# Patient Record
Sex: Male | Born: 1940 | Race: White | Hispanic: No | Marital: Married | Smoking: Never smoker
Health system: Southern US, Community
[De-identification: ages and names within clinical notes are randomized; demographics above are authoritative.]

## PROBLEM LIST (undated history)

## (undated) DIAGNOSIS — R609 Edema, unspecified: Secondary | ICD-10-CM

## (undated) DIAGNOSIS — G500 Trigeminal neuralgia: Secondary | ICD-10-CM

## (undated) DIAGNOSIS — C801 Malignant (primary) neoplasm, unspecified: Secondary | ICD-10-CM

## (undated) DIAGNOSIS — L02212 Cutaneous abscess of back [any part, except buttock]: Secondary | ICD-10-CM

## (undated) DIAGNOSIS — I499 Cardiac arrhythmia, unspecified: Secondary | ICD-10-CM

## (undated) DIAGNOSIS — E119 Type 2 diabetes mellitus without complications: Secondary | ICD-10-CM

## (undated) DIAGNOSIS — G509 Disorder of trigeminal nerve, unspecified: Secondary | ICD-10-CM

## (undated) DIAGNOSIS — A499 Bacterial infection, unspecified: Secondary | ICD-10-CM

## (undated) DIAGNOSIS — I10 Essential (primary) hypertension: Secondary | ICD-10-CM

## (undated) DIAGNOSIS — K219 Gastro-esophageal reflux disease without esophagitis: Secondary | ICD-10-CM

## (undated) DIAGNOSIS — M1990 Unspecified osteoarthritis, unspecified site: Secondary | ICD-10-CM

## (undated) HISTORY — DX: Type 2 diabetes mellitus without complications: E11.9

## (undated) HISTORY — DX: Disorder of trigeminal nerve, unspecified: G50.9

## (undated) HISTORY — PX: OTHER SURGICAL HISTORY: SHX169

## (undated) HISTORY — DX: Cutaneous abscess of back (any part, except buttock): L02.212

## (undated) HISTORY — DX: Gastro-esophageal reflux disease without esophagitis: K21.9

## (undated) HISTORY — PX: TRIGEMINAL NERVE DECOMPRESSION: SHX2579

## (undated) HISTORY — DX: Bacterial infection, unspecified: A49.9

---

## 2000-09-16 ENCOUNTER — Ambulatory Visit (HOSPITAL_COMMUNITY): Admission: RE | Admit: 2000-09-16 | Discharge: 2000-09-16 | Payer: Self-pay | Admitting: Neurosurgery

## 2000-09-16 ENCOUNTER — Encounter: Payer: Self-pay | Admitting: Neurosurgery

## 2000-09-26 ENCOUNTER — Encounter: Payer: Self-pay | Admitting: Neurosurgery

## 2000-09-28 ENCOUNTER — Encounter: Payer: Self-pay | Admitting: Neurosurgery

## 2000-09-28 ENCOUNTER — Ambulatory Visit (HOSPITAL_COMMUNITY): Admission: RE | Admit: 2000-09-28 | Discharge: 2000-09-28 | Payer: Self-pay | Admitting: Neurosurgery

## 2000-10-06 ENCOUNTER — Inpatient Hospital Stay (HOSPITAL_COMMUNITY): Admission: RE | Admit: 2000-10-06 | Discharge: 2000-10-09 | Payer: Self-pay | Admitting: Neurosurgery

## 2000-10-08 ENCOUNTER — Encounter: Payer: Self-pay | Admitting: Neurosurgery

## 2002-10-06 ENCOUNTER — Ambulatory Visit (HOSPITAL_COMMUNITY): Admission: RE | Admit: 2002-10-06 | Discharge: 2002-10-06 | Payer: Self-pay | Admitting: Neurosurgery

## 2002-10-06 ENCOUNTER — Encounter: Payer: Self-pay | Admitting: Neurosurgery

## 2003-05-20 ENCOUNTER — Ambulatory Visit (HOSPITAL_COMMUNITY): Admission: RE | Admit: 2003-05-20 | Discharge: 2003-05-20 | Payer: Self-pay | Admitting: Neurosurgery

## 2004-05-24 ENCOUNTER — Ambulatory Visit: Payer: Self-pay | Admitting: Internal Medicine

## 2004-06-02 ENCOUNTER — Ambulatory Visit: Payer: Self-pay | Admitting: Internal Medicine

## 2008-02-29 HISTORY — PX: APPENDECTOMY: SHX54

## 2008-09-06 ENCOUNTER — Inpatient Hospital Stay: Payer: Self-pay | Admitting: Surgery

## 2010-06-16 ENCOUNTER — Ambulatory Visit: Payer: Self-pay

## 2010-07-30 ENCOUNTER — Ambulatory Visit: Payer: Self-pay | Admitting: Unknown Physician Specialty

## 2010-08-29 ENCOUNTER — Ambulatory Visit: Payer: Self-pay | Admitting: Unknown Physician Specialty

## 2013-02-28 DIAGNOSIS — A499 Bacterial infection, unspecified: Secondary | ICD-10-CM

## 2013-02-28 HISTORY — DX: Bacterial infection, unspecified: A49.9

## 2013-05-21 LAB — COMPREHENSIVE METABOLIC PANEL
ALK PHOS: 57 U/L
ALT: 22 U/L (ref 12–78)
AST: 20 U/L (ref 15–37)
Albumin: 3.5 g/dL (ref 3.4–5.0)
Anion Gap: 4 — ABNORMAL LOW (ref 7–16)
BUN: 18 mg/dL (ref 7–18)
Bilirubin,Total: 0.8 mg/dL (ref 0.2–1.0)
CALCIUM: 8.3 mg/dL — AB (ref 8.5–10.1)
CO2: 26 mmol/L (ref 21–32)
CREATININE: 1.15 mg/dL (ref 0.60–1.30)
Chloride: 105 mmol/L (ref 98–107)
EGFR (Non-African Amer.): >60
GLUCOSE: 216 mg/dL — AB (ref 65–99)
Osmolality: 279 (ref 275–301)
Potassium: 4.5 mmol/L (ref 3.5–5.1)
SODIUM: 135 mmol/L — AB (ref 136–145)
TOTAL PROTEIN: 6.7 g/dL (ref 6.4–8.2)

## 2013-05-21 LAB — CBC WITH DIFFERENTIAL/PLATELET
Basophil #: 0.2 10*3/uL — ABNORMAL HIGH (ref 0.0–0.1)
Basophil %: 1 %
Eosinophil #: 0 10*3/uL (ref 0.0–0.7)
Eosinophil %: 0.1 %
HCT: 37.6 % — AB (ref 40.0–52.0)
HGB: 12.4 g/dL — ABNORMAL LOW (ref 13.0–18.0)
LYMPHS ABS: 0.4 10*3/uL — AB (ref 1.0–3.6)
LYMPHS PCT: 2.4 %
MCH: 28.8 pg (ref 26.0–34.0)
MCHC: 33.1 g/dL (ref 32.0–36.0)
MCV: 87 fL (ref 80–100)
MONOS PCT: 4.2 %
Monocyte #: 0.8 x10 3/mm (ref 0.2–1.0)
NEUTROS ABS: 16.8 10*3/uL — AB (ref 1.4–6.5)
NEUTROS PCT: 92.3 %
PLATELETS: 353 10*3/uL (ref 150–440)
RBC: 4.31 10*6/uL — AB (ref 4.40–5.90)
RDW: 13.1 % (ref 11.5–14.5)
WBC: 18.2 10*3/uL — ABNORMAL HIGH (ref 3.8–10.6)

## 2013-05-21 LAB — URINALYSIS, COMPLETE
BACTERIA: NONE SEEN
BILIRUBIN, UR: NEGATIVE
Blood: NEGATIVE
Glucose,UR: 50 mg/dL (ref 0–75)
Leukocyte Esterase: NEGATIVE
NITRITE: NEGATIVE
Ph: 5 (ref 4.5–8.0)
Protein: 30
RBC,UR: <1 /HPF (ref 0–5)
Specific Gravity: 1.031 (ref 1.003–1.030)
WBC UR: 2 /HPF (ref 0–5)

## 2013-05-21 LAB — LIPASE, BLOOD: Lipase: 147 U/L (ref 73–393)

## 2013-05-22 ENCOUNTER — Inpatient Hospital Stay: Payer: Self-pay | Admitting: Student

## 2013-05-23 LAB — BASIC METABOLIC PANEL
Anion Gap: 5 — ABNORMAL LOW (ref 7–16)
BUN: 22 mg/dL — ABNORMAL HIGH (ref 7–18)
CREATININE: 1.06 mg/dL (ref 0.60–1.30)
Calcium, Total: 7.9 mg/dL — ABNORMAL LOW (ref 8.5–10.1)
Chloride: 107 mmol/L (ref 98–107)
Co2: 24 mmol/L (ref 21–32)
Glucose: 168 mg/dL — ABNORMAL HIGH (ref 65–99)
Osmolality: 279 (ref 275–301)
POTASSIUM: 3.5 mmol/L (ref 3.5–5.1)
SODIUM: 136 mmol/L (ref 136–145)

## 2013-05-23 LAB — CBC WITH DIFFERENTIAL/PLATELET
BASOS PCT: 0.2 %
Basophil #: 0 10*3/uL (ref 0.0–0.1)
EOS PCT: 0.1 %
Eosinophil #: 0 10*3/uL (ref 0.0–0.7)
HCT: 35.1 % — ABNORMAL LOW (ref 40.0–52.0)
HGB: 11.8 g/dL — ABNORMAL LOW (ref 13.0–18.0)
Lymphocyte #: 0.6 10*3/uL — ABNORMAL LOW (ref 1.0–3.6)
Lymphocyte %: 5.5 %
MCH: 29.2 pg (ref 26.0–34.0)
MCHC: 33.6 g/dL (ref 32.0–36.0)
MCV: 87 fL (ref 80–100)
MONOS PCT: 9.4 %
Monocyte #: 1 x10 3/mm (ref 0.2–1.0)
Neutrophil #: 8.9 10*3/uL — ABNORMAL HIGH (ref 1.4–6.5)
Neutrophil %: 84.8 %
PLATELETS: 252 10*3/uL (ref 150–440)
RBC: 4.04 10*6/uL — ABNORMAL LOW (ref 4.40–5.90)
RDW: 13.7 % (ref 11.5–14.5)
WBC: 10.5 10*3/uL (ref 3.8–10.6)

## 2013-05-24 LAB — CLOSTRIDIUM DIFFICILE(ARMC)

## 2013-05-26 LAB — STOOL CULTURE

## 2013-05-27 LAB — CBC WITH DIFFERENTIAL/PLATELET
BASOS ABS: 0 10*3/uL (ref 0.0–0.1)
Basophil %: 0.4 %
EOS ABS: 0.2 10*3/uL (ref 0.0–0.7)
EOS PCT: 3.4 %
HCT: 36 % — AB (ref 40.0–52.0)
HGB: 12.2 g/dL — AB (ref 13.0–18.0)
Lymphocyte #: 0.9 10*3/uL — ABNORMAL LOW (ref 1.0–3.6)
Lymphocyte %: 13.1 %
MCH: 29.1 pg (ref 26.0–34.0)
MCHC: 33.9 g/dL (ref 32.0–36.0)
MCV: 86 fL (ref 80–100)
Monocyte #: 0.7 x10 3/mm (ref 0.2–1.0)
Monocyte %: 10.7 %
NEUTROS PCT: 72.4 %
Neutrophil #: 4.9 10*3/uL (ref 1.4–6.5)
Platelet: 355 10*3/uL (ref 150–440)
RBC: 4.19 10*6/uL — ABNORMAL LOW (ref 4.40–5.90)
RDW: 13.1 % (ref 11.5–14.5)
WBC: 6.8 10*3/uL (ref 3.8–10.6)

## 2013-05-27 LAB — BASIC METABOLIC PANEL
Anion Gap: 7 (ref 7–16)
BUN: 23 mg/dL — ABNORMAL HIGH (ref 7–18)
CALCIUM: 8.1 mg/dL — AB (ref 8.5–10.1)
Chloride: 112 mmol/L — ABNORMAL HIGH (ref 98–107)
Co2: 23 mmol/L (ref 21–32)
Creatinine: 0.72 mg/dL (ref 0.60–1.30)
EGFR (African American): >60
Glucose: 186 mg/dL — ABNORMAL HIGH (ref 65–99)
Osmolality: 292 (ref 275–301)
POTASSIUM: 3.9 mmol/L (ref 3.5–5.1)
Sodium: 142 mmol/L (ref 136–145)

## 2013-05-27 LAB — CULTURE, BLOOD (SINGLE)

## 2013-07-29 ENCOUNTER — Ambulatory Visit: Payer: Self-pay | Admitting: Gastroenterology

## 2013-07-31 LAB — PATHOLOGY REPORT

## 2013-08-27 DIAGNOSIS — I10 Essential (primary) hypertension: Secondary | ICD-10-CM | POA: Insufficient documentation

## 2013-08-27 DIAGNOSIS — E119 Type 2 diabetes mellitus without complications: Secondary | ICD-10-CM | POA: Insufficient documentation

## 2013-08-27 DIAGNOSIS — E785 Hyperlipidemia, unspecified: Secondary | ICD-10-CM | POA: Insufficient documentation

## 2013-12-08 DIAGNOSIS — E119 Type 2 diabetes mellitus without complications: Secondary | ICD-10-CM | POA: Insufficient documentation

## 2013-12-08 DIAGNOSIS — Z794 Long term (current) use of insulin: Secondary | ICD-10-CM | POA: Insufficient documentation

## 2014-05-12 ENCOUNTER — Ambulatory Visit (INDEPENDENT_AMBULATORY_CARE_PROVIDER_SITE_OTHER): Payer: Medicare Other | Admitting: Podiatry

## 2014-05-12 ENCOUNTER — Encounter: Payer: Self-pay | Admitting: Podiatry

## 2014-05-12 ENCOUNTER — Ambulatory Visit: Payer: Medicare Other

## 2014-05-12 VITALS — BP 138/78 | HR 58 | Resp 16 | Ht 66.0 in | Wt 185.0 lb

## 2014-05-12 DIAGNOSIS — M7750 Other enthesopathy of unspecified foot: Secondary | ICD-10-CM

## 2014-05-12 DIAGNOSIS — M19079 Primary osteoarthritis, unspecified ankle and foot: Secondary | ICD-10-CM | POA: Diagnosis not present

## 2014-05-12 DIAGNOSIS — M779 Enthesopathy, unspecified: Secondary | ICD-10-CM

## 2014-05-12 NOTE — Progress Notes (Signed)
   Subjective:    Patient ID: Larry Lane, male    DOB: 02-16-1941, 74 y.o.   MRN: [id number]  HPI Comments: "I have problems with the ankles"  Patient c/o aching medial ankles bilateral, right over left, for several months. Worsened in the last 6 months. The area is swollen. No injury. He feels more pain with a lot of walking or climbing ladders. He did see PCP and he sent for xray and they told him to bring to this appointment. No treatments. He has tried heat and ice at home.  Patient brought xrays and a report today of both ankles  Diabetic x 20 years and last A1C was 8.0     Review of Systems  Genitourinary: Positive for frequency.  Musculoskeletal: Positive for arthralgias and gait problem.  Neurological: Positive for dizziness and light-headedness.  Hematological: Bruises/bleeds easily.  Psychiatric/Behavioral: Positive for confusion.  All other systems reviewed and are negative.      Objective:   Physical Exam: I have reviewed his past medical history medications allergies surgery social history and review of systems. Pulses are strongly palpable bilateral. Neurologic sensorium is intact. Deep tendon reflexes are intact bilateral muscle strength is 5 over 5 dorsiflexion plantar flexors and inverters everters all intrinsic musculature is intact. Orthopedic evaluation demonstrates all joints distal to the ankle for range of motion without crepitation. The ankle joint does not demonstrate crepitation but is limited on his range of motion. Radiographic evaluation which he brought with him today demonstrate joint space narrowing and subchondral sclerosis indicative of osteoarthritis. Mild hallux valgus deformity and hammertoe deformities are also noted bilateral.        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  Plan: Cortisone injection after sterile Betadine skin prep of the ankles bilaterally today. We'll follow up  with me in 6 weeks if necessary.

## 2014-05-12 NOTE — Patient Instructions (Signed)
Diabetes and Foot Care Diabetes may cause you to have problems because of poor blood supply (circulation) to your feet and legs. This may cause the skin on your feet to become thinner, break easier, and heal more slowly. Your skin may become dry, and the skin may peel and crack. You may also have nerve damage in your legs and feet causing decreased feeling in them. You may not notice minor injuries to your feet that could lead to infections or more serious problems. Taking care of your feet is one of the most important things you can do for yourself.  HOME CARE INSTRUCTIONS  Wear shoes at all times, even in the house. Do not go barefoot. Bare feet are easily injured.  Check your feet daily for blisters, cuts, and redness. If you cannot see the bottom of your feet, use a mirror or ask someone for help.  Wash your feet with warm water (do not use hot water) and mild soap. Then pat your feet and the areas between your toes until they are completely dry. Do not soak your feet as this can dry your skin.  Apply a moisturizing lotion or petroleum jelly (that does not contain alcohol and is unscented) to the skin on your feet and to dry, brittle toenails. Do not apply lotion between your toes.  Trim your toenails straight across. Do not dig under them or around the cuticle. File the edges of your nails with an emery board or nail file.  Do not cut corns or calluses or try to remove them with medicine.  Wear clean socks or stockings every day. Make sure they are not too tight. Do not wear knee-high stockings since they may decrease blood flow to your legs.  Wear shoes that fit properly and have enough cushioning. To break in new shoes, wear them for just a few hours a day. This prevents you from injuring your feet. Always look in your shoes before you put them on to be sure there are no objects inside.  Do not cross your legs. This may decrease the blood flow to your feet.  If you find a minor scrape,  cut, or break in the skin on your feet, keep it and the skin around it clean and dry. These areas may be cleansed with mild soap and water. Do not cleanse the area with peroxide, alcohol, or iodine.  When you remove an adhesive bandage, be sure not to damage the skin around it.  If you have a wound, look at it several times a day to make sure it is healing.  Do not use heating pads or hot water bottles. They may burn your skin. If you have lost feeling in your feet or legs, you may not know it is happening until it is too late.  Make sure your health care provider performs a complete foot exam at least annually or more often if you have foot problems. Report any cuts, sores, or bruises to your health care provider immediately. SEEK MEDICAL CARE IF:   You have an injury that is not healing.  You have cuts or breaks in the skin.  You have an ingrown nail.  You notice redness on your legs or feet.  You feel burning or tingling in your legs or feet.  You have pain or cramps in your legs and feet.  Your legs or feet are numb.  Your feet always feel cold. SEEK IMMEDIATE MEDICAL CARE IF:   There is increasing redness,   swelling, or pain in or around a wound.  There is a red line that goes up your leg.  Pus is coming from a wound.  You develop a fever or as directed by your health care provider.  You notice a bad smell coming from an ulcer or wound. Document Released: 02/12/2000 Document Revised: 10/17/2012 Document Reviewed: 07/24/2012 Exit[employer] Patient Information 2015 [employer]. This information is not intended to replace advice given to you by your health care provider. Make sure you discuss any questions you have with your health care provider.  

## 2014-05-12 NOTE — Addendum Note (Signed)
Addended by: Rip Harbour on: 05/12/2014 04:24 PM   Modules accepted: Orders

## 2014-06-21 NOTE — Consult Note (Signed)
PATIENT NAME:  Larry Lane, Larry Lane MR#:  [id number] DATE OF BIRTH:  Feb 12, 1941  DATE OF CONSULTATION:  05/22/2013  PRIMARY CARE PHYSICIAN:  Dr. Morton Peters., MD ATTENDING PHYSICIAN:  Dr. Carolan Clines PHYSICIAN:  Dr. Joen Laura, NP  HISTORY OF PRESENT ILLNESS: Larry Lane is a very pleasant 74 year old Caucasian gentleman with a history of diabetes mellitus and trigeminal neuralgia. He had presented to [facility] Emergency Room for the chief complaint of severe right lower quadrant abdominal pain. He has been experiencing abdominal discomfort intermittently for the past year. 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 Really since this timeframe, per him, he has actually been intermittently experiencing discomfort, but again has been more frequent and increased in severity over the past year. Upon presenting to the hospital, his pain was a 10/10 and no radiating pain. Normally he does experience loose diarrhea-like stools. He also has been taking Pepto-Bismol on a very regular basis to try to manage the pain to the right lower quadrant, which has really not seemed to help. Nauseated at this time, he did have episodes of vomiting yesterday. No bright red blood. No melena. Wife present during interviewing process also states that he experiences dysphagia at times. Solid food bolus getting hung average twice a week, which has caused him to regurgitate at times.   The patient had been out in the weather this past week with raining cooking barbecue but does not feel that that this is reason for his current symptoms, as he has also been febrile during his hospitalization with temperature to 103. Has not eaten any oysters. Has had no recent antibiotic use or traveled out of the country. He is a Psychologist, sport and exercise, farming wheat, soy and tobacco.    The patient has had colonoscopy performed in the past, was done in 2006 by Dr. Denyse Amass.  Findings of internal hemorrhoids. Colon was normal. Biopsies were obtained. 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 Biopsies at the time of the upper endoscopy antrum revealed mild chronic nonspecific gastritis. Evidence of a reflux esophagitis noted at the time of biopsies taken from the esophagus. Reading the report though, does not appear that duodenal stenosis was dilated at that time.   PAST MEDICAL HISTORY: Diabetes mellitus, trigeminal neuralgia.  PAST SURGICAL HISTORY: Multiple surgeries for trigeminal neuralgia and appendectomy. Colonoscopy and upper endoscopy 2006. ALLERGIES: None.   HOME MEDICATIONS: Ranitidine 150 mg once a day, probiotic 1 capsule daily, Lantus 22 units once a day, sliding scale insulin, fish oil 1000 mg 2 a day, dicyclomine 10 mg 3 times daily, Questran 3 times daily, atenolol 50 mg once a day, aspirin 325 two tablets once a day and Percocet 1 tablet every 6 hours as needed.   SOCIAL HISTORY: No tobacco. No alcohol use.   FAMILY HISTORY:  No history of colon cancer or other neoplasm.  History of diabetes.  Family history of believed to be IBS involving brother and a niece, possibly IBD.  REVIEW OF SYSTEMS:  All 10 systems reviewed and checked, otherwise unremarkable unless stated above.   PHYSICAL EXAMINATION: VITAL SIGNS: Temperature is 99.2, pulse of 91, respirations 20, blood pressure 135/64 with a pulse of 92% on room air.  GENERAL: Well-developed, well-nourished 74 year old Caucasian gentleman who appears not to be feeling well.  HEENT: Normocephalic,  atraumatic. Pupils equal, reactive to light. Conjunctivae clear. Sclerae anicteric.  NECK: Supple. Trachea midline. No lymphadenopathy or thyromegaly.  PULMONARY: Symmetric rise and fall of chest. Clear to auscultation throughout.  CARDIOVASCULAR:  Regular rhythm, S1, S2. No murmurs or gallops.  ABDOMEN: High-pitched bowel sounds noted left and right lower quadrant. Bowel sounds in bilateral upper quadrants, slightly distended abdomen.  No bruits. No masses. No evidence of hepatosplenomegaly.  RECTAL: Deferred.  MUSCULOSKELETAL: Moving all 4 extremities. No contractures. No clubbing.  EXTREMITIES: No edema.  SKIN: Warm, dry. Color slightly pale. No lesions. No rashes.  NEUROLOGICAL: No gross neurological deficits.  PSYCHIATRIC: No depression. Alert and oriented x 4, appropriate affect and mood.   LABORATORY, DIAGNOSTIC AND RADIOLOGICAL DATA: Chemistry panel on admission: Glucose elevated 216. Sodium was 135. Calcium is low at 8.3. Lipase is 147. Hepatic panel within normal limits. CBC: WBC count was elevated at 18.2 with RBC of 4.31, hemoglobin 12.4 with hematocrit of 37.6. Neutrophil number elevated at 16.8 with a lymphocyte low at 0.4. Basophil elevated at 0.2.  Urinalysis: Ketones are +1. Protein is 30 mg/dL. Negative for nitrites and leukocytes.  CT scan of abdomen and pelvis with contrast on admission: Liver, spleen, gallbladder, pancreas and adrenal glands are unremarkable. Inflammatory changes of the terminal ileum with fecalized small bowel. Distal ileum bowel loop measuring 4.7 cm in transaxial dimension. Extensive cecal and ascending colon wall edema with surrounding inflammatory changes status post appendectomy. Trace of free fluid in the right pericolic gutter without abscess or pneumoperitoneum. Stomach: Proximal small bowel are normal in caliber and coarse without inflammatory changes. Kidneys are, I think it is supposed to be atherotopic; but it says "orthotopic" demonstrating symmetric enhancement. Degenerative changes noted to hips as well as mild degenerative changes lumbar spine.   IMPRESSION: Right lower quadrant abdominal pain has been intermittent but a constant presentation in the last 24 to 48 hours with associated  vomiting. Abnormal GI x-ray, CT scan of abdomen and pelvis revealing terminal ileum and cecal/ascending colonic wall edema and inflammatory changes consistent with enterocolitis, possibly infectious or less likely inflammatory in nature. Concern though does exist by the fact of symptoms being very similar in presentation when he had his appendectomy. Also, experiencing dysphagia. 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 Leukocytosis. Fever.   PLAN: The patient's presentation will be discussed with Dr. Verdie Shire. The patient should continue with meropenem 1 gram every 8 hours. Pending chest, PA and lateral.  These services provided by Payton Emerald, MS, APRN, [facility], FNP under collaborative agreement with Verdie Shire, M.D.   ____________________________ Payton Emerald, NP dsh:ce D: 05/22/2013 14:00:44 ET T: 05/22/2013 15:36:48 ET JOB#: [id number]  cc: Payton Emerald, NP, <Dictator> Payton Emerald MD ELECTRONICALLY SIGNED 05/22/2013 17:40

## 2014-06-21 NOTE — Consult Note (Signed)
Chief Complaint:  Subjective/Chief Complaint seen for abnormal ct/ileitis.  positive campylobacter stool. much improved, no abdominal pain or nausea.   VITAL SIGNS/ANCILLARY NOTES: **Vital Signs.:   31-Mar-15 05:04  Vital Signs Type Routine  Temperature Temperature (F) 98.1  Celsius 36.7  Temperature Source oral  Pulse Pulse 64  Respirations Respirations 18  Systolic BP Systolic BP 951  Diastolic BP (mmHg) Diastolic BP (mmHg) 74  Mean BP 110  Pulse Ox % Pulse Ox % 99  Pulse Ox Activity Level  At rest  Oxygen Delivery Room Air/ 21 %    07:11  Vital Signs Type Recheck  Systolic BP Systolic BP 884  Diastolic BP (mmHg) Diastolic BP (mmHg) 79  Mean BP 109   Brief Assessment:  Cardiac Regular   Respiratory clear BS   Gastrointestinal details normal Soft  Nontender  Nondistended  No masses palpable   Lab Results:  Routine Chem:  30-Mar-15 03:36   Glucose, Serum  186  BUN  23  Creatinine (comp) 0.72  Sodium, Serum 142  Potassium, Serum 3.9  Chloride, Serum  112  CO2, Serum 23  Calcium (Total), Serum  8.1  Anion Gap 7  Osmolality (calc) 292  eGFR (African American) >60  eGFR (Non-African American) >60 (eGFR values <64m/min/1.73 m2 may be an indication of chronic kidney disease (CKD). Calculated eGFR is useful in patients with stable renal function. The eGFR calculation will not be reliable in acutely ill patients when serum creatinine is changing rapidly. It is not useful in  patients on dialysis. The eGFR calculation may not be applicable to patients at the low and high extremes of body sizes, pregnant women, and vegetarians.)  Routine Hem:  30-Mar-15 03:36   WBC (CBC) 6.8  RBC (CBC)  4.19  Hemoglobin (CBC)  12.2  Hematocrit (CBC)  36.0  Platelet Count (CBC) 355  MCV 86  MCH 29.1  MCHC 33.9  RDW 13.1  Neutrophil % 72.4  Lymphocyte % 13.1  Monocyte % 10.7  Eosinophil % 3.4  Basophil % 0.4  Neutrophil # 4.9  Lymphocyte #  0.9  Monocyte # 0.7   Eosinophil # 0.2  Basophil # 0.0 (Result(s) reported on 27 May 2013 at 04:11AM.)   Assessment/Plan:  Assessment/Plan:  Assessment 1) abdominal pain, n,v partial sbo-resolved.  abnormal ct with ileitis noted, but positive stool for campylobacter.  has responded to treatment well.   Plan 1) recommend finish 10 day course of abx, advance diet as tolerated, no luminal evaluation planned as inpatient, will need GI o/p followup and colonoscopy in several weeks.  Will sign off, reconsult as needed. Discussed with Dr ABridgette Habermann   Electronic Signatures: SLoistine Simas(MD)  (Signed 31-Mar-15 13:04)  Authored: Chief Complaint, VITAL SIGNS/ANCILLARY NOTES, Brief Assessment, Lab Results, Assessment/Plan   Last Updated: 31-Mar-15 13:04 by SLoistine Simas(MD)

## 2014-06-21 NOTE — Consult Note (Signed)
Brief Consult Note: Diagnosis: Right lower quadrant abdominal pain.  Abnormal GI x-ray findings of concern for enterocolitis.  Fever.  Dysphagia.   Consult note dictated.   Orders entered.   Discussed with Attending MD.   Comments: Patient's presentation discussed with Dr. Verdie Shire.  Will proceed with diagnostic colonoscopy on Friday of this week along with EGD with dilatation.  History of duodenal stenosis.  Experiencing dysphagia.  Will continue to monitor.  Remain on current antibiotic therapy.  Pending chest x-ray PA and LAT.  Orders placed.  Electronic Signatures: Payton Emerald (NP)  (Signed 25-Mar-15 14:35)  Authored: Brief Consult Note   Last Updated: 25-Mar-15 14:35 by Payton Emerald (NP)

## 2014-06-21 NOTE — Consult Note (Signed)
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   Patient was seen by consultant.   Consult note dictated.   Recommend further assessment or treatment.   Discussed with Attending MD.   Comments: s/p lap appy 08/2008. Had a 12 mm appendix on CT then; none now. Has circumferential significant TI thickening with associated inflammation to ascending colon. Has a 4 year h/o chronic diarrhea too. No indications for acute surgical intervention. Will follow.  Electronic Signatures: Consuela Mimes (MD)  (Signed 25-Mar-15 00:55)  Authored: Brief Consult Note   Last Updated: 25-Mar-15 00:55 by Consuela Mimes (MD)

## 2014-06-21 NOTE — H&P (Signed)
PATIENT NAME:  Larry Lane, Larry Lane MR#:  [id number] DATE OF BIRTH:  August 21, 1940  DATE OF ADMISSION:  05/22/2013  PRIMARY CARE PHYSICIAN: Morton Peters., MD  REFERRING PHYSICIAN: Dr. Owens Shark  CHIEF COMPLAINT: Abdominal pain, nausea, vomiting.   HISTORY OF PRESENT ILLNESS: Larry Lane, a 74 year old pleasant male, with past medical history of diabetes mellitus, insulin-dependent, trigeminal neuralgia has been having issues with his stomach for a long time. Experiences abdominal pain in the right lower quadrant fort more than a year. Patient was also noticed to have information in the ascending colon area. Per patient, underwent colonoscopies; however, the patient is unable to give any information. The patient also does not remember where his colonoscopies were done. The patient was admitted in 2010 and was found to have inflamed appendix. Underwent laparoscopic appendectomy. 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  The patient has been experiencing abdominal pain on and off as mentioned above. However, since yesterday started to have pain in the right lower quadrant, 10/10 intensity, no radiation. Had his lunch.  Since then having multiple episodes of vomiting. Also had 2 episodes of diarrhea. The patient always has some diarrhea. CT abdomen and pelvis done in the Emergency Department showed  terminal ileum and  cecal ascending colonic wall edema and inflammatory changes consistent with enterocolitis, dilated distal ileum, and small bowel creases suggesting chronic stasis without frank bowel obstruction. Concerning about the inflammation, consulted surgery who felt this is not an acute surgical problem.  PAST MEDICAL HISTORY:  1.  Diabetes mellitus, insulin-dependent.  2.  Trigeminal neuralgia. Underwent gamma knife with significant improvement with the pain.   PAST SURGICAL HISTORY: Multiple surgeries for trigeminal neuralgia and appendectomy.    ALLERGIES: No known drug allergies.   HOME MEDICATIONS:  1.  Ranitidine 150 mg once a day. 2.  Probiotic 1 capsule once a day.  3.  Lantus 22 units once a day.  4.  Sliding scale insulin.  5.  Fish oil 1000 mg 2 times a day. 6.  Dicyclomine 10 mg 3 times a day. 7.  Cholestyramine 3 times a day. 5 8.  Atenolol 50 mg once a day.  9.  Aspirin 325 mg 2 times a day.  10.  Percocet 1 tablet every 6 hours as needed.   SOCIAL HISTORY: No history of smoking, drinking alcohol, or using illicit drugs. Married, Lives with his wife.  Works as a Psychologist, sport and exercise.   FAMILY HISTORY:  History of diabetes mellitus.   REVIEW OF SYSTEMS:  CONSTITUTIONAL: Generalized weakness.  EYES: No change in vision.  ENT: No change in hearing or sore throat.  RESPIRATORY: No cough, shortness of breath.  CARDIOVASCULAR: No chest pains or palpitations.  GASTROINTESTINAL: Has nausea, vomiting, diarrhea, and abdominal pain. GENITOURINARY: No dysuria or hematuria.  ENDOCRINE: No polyuria or polydipsia. Has diagnosis of diabetes mellitus. SKIN: No rash or lesions.  MUSCULOSKELETAL: Denies any joint pains and aches.  NEUROLOGIC:  No weakness or numbness in any part of the body.   PHYSICAL EXAMINATION:  GENERAL: Reveals a well-built, well-nourished, age-appropriate male, lying down in the bed, not in acute distress.  VITAL SIGNS: Temperature 98.8, pulse 85, blood pressure 119/57, respiratory rate of 18, oxygen saturation is 95% on room air.  HEENT: Head: Normocephalic, atraumatic. There is no scleral icterus. Conjunctivae normal. Pupils equal and react to light. Extraocular movements are intact. Mucous membranes moist. No pharyngeal erythema.  NECK: Supple. No lymphadenopathy. No JVD. No carotid bruit.  No thyromegaly.  CHEST: Has no focal tenderness.  LUNGS: Bilaterally clear to auscultation.   HEART: S1, S2 regular. No murmurs are heard.  ABDOMEN: Bowel sounds present. Has tenderness in the right lower quadrant with mild  guarding. No rebound tenderness. Could not appreciate hepatosplenomegaly.  EXTREMITIES: No pedal edema. Pulses 2+.  NEUROLOGIC: The patient is alert, oriented to place, person, and time. Cranial nerves II through XII intact. Motor 5/5 in upper and lower extremities.  SKIN: No rash or lesions.  MUSCULOSKELETAL: Good range of motion in all the extremities.   LABORATORY DATA: CMP is completely within normal limits. CBC: WBC of 18.2, hemoglobin 12.4, platelet count of 353,000. Urinalysis negative for nitrites and leukocyte esterase.   CT abdomen and pelvis: Terminal ileum and cecal ascending colonic wall edema and inflammatory changes most consistent with enterocolitis; could be infectious. No bowel perforation or abscess. Is status post appendectomy. Dilated distal ileum with a small bowel features suggesting chronic stasis without frank bowel obstruction.   ASSESSMENT AND PLAN: Larry Lane is a 74 year old male who comes to the Emergency Department with nausea, vomiting, and is found to have terminal ileum and ascending colonic wall edema.  1.  Colitis, inflammatory versus infectious. Patient seems to have had this problem for a long time. Will need to obtain medical records from the primary care physician's office. Keep the patient on meropenem. Consulted gastroenterology in the morning. Keep the patient n.p.o. Continue with IV fluids.  2.  Diabetes mellitus. As patient is n.p.o., will keep the patient on Lantus 11 units.  3.  Nausea, vomiting, most likely secondary to narrowing of the ileum with bowel obstruction, improved.  4.  Chronic diarrhea as mentioned above. Need to work up the cause of the inflammation in the distal ileum and ascending colon area.   5.  Keep the patient on deep vein thrombosis prophylaxis with Lovenox.  TIME SPENT: Fifty minutes.   ____________________________ Monica Becton, MD pv:am D: 05/22/2013 01:38:10 ET T: 05/22/2013 02:22:50 ET JOB#: [id number]  cc: Monica Becton, MD, <Dictator> Morton Peters., MD Monica Becton MD ELECTRONICALLY SIGNED 06/13/2013 0:22

## 2014-06-21 NOTE — Consult Note (Signed)
Chief Complaint:  Subjective/Chief Complaint Nausea. No abd pain. WBC normal now.   VITAL SIGNS/ANCILLARY NOTES: **Vital Signs.:   26-Mar-15 04:31  Vital Signs Type Routine  Temperature Temperature (F) 98  Celsius 36.6  Temperature Source oral  Pulse Pulse 86  Respirations Respirations 20  Systolic BP Systolic BP 665  Diastolic BP (mmHg) Diastolic BP (mmHg) 71  Mean BP 88  Pulse Ox % Pulse Ox % 93  Pulse Ox Activity Level  At rest  Oxygen Delivery Room Air/ 21 %   Brief Assessment:  GEN no acute distress   Cardiac Regular   Respiratory clear BS   Gastrointestinal decreased bowel sounds   Lab Results: Routine Chem:  26-Mar-15 05:56   Glucose, Serum  168  BUN  22  Creatinine (comp) 1.06  Sodium, Serum 136  Potassium, Serum 3.5  Chloride, Serum 107  CO2, Serum 24  Calcium (Total), Serum  7.9  Anion Gap  5  Osmolality (calc) 279  eGFR (African American) >60  eGFR (Non-African American) >60 (eGFR values <71m/min/1.73 m2 may be an indication of chronic kidney disease (CKD). Calculated eGFR is useful in patients with stable renal function. The eGFR calculation will not be reliable in acutely ill patients when serum creatinine is changing rapidly. It is not useful in  patients on dialysis. The eGFR calculation may not be applicable to patients at the low and high extremes of body sizes, pregnant women, and vegetarians.)  Routine Hem:  26-Mar-15 05:56   WBC (CBC) 10.5  RBC (CBC)  4.04  Hemoglobin (CBC)  11.8  Hematocrit (CBC)  35.1  Platelet Count (CBC) 252  MCV 87  MCH 29.2  MCHC 33.6  RDW 13.7  Neutrophil % 84.8  Lymphocyte % 5.5  Monocyte % 9.4  Eosinophil % 0.1  Basophil % 0.2  Neutrophil #  8.9  Lymphocyte #  0.6  Monocyte # 1.0  Eosinophil # 0.0  Basophil # 0.0 (Result(s) reported on 23 May 2013 at 06:28AM.)   Assessment/Plan:  Assessment/Plan:  Assessment Dysphagia. Colitis.   Plan Continue Abx. Will see if patient can tolerate bowel prep  today for colonoscopy plus EGD with dilation tomorrow. Thanks.   Electronic Signatures: OVerdie Shire(MD)  (Signed 26-Mar-15 08:38)  Authored: Chief Complaint, VITAL SIGNS/ANCILLARY NOTES, Brief Assessment, Lab Results, Assessment/Plan   Last Updated: 26-Mar-15 08:38 by OVerdie Shire(MD)

## 2014-06-21 NOTE — Consult Note (Signed)
Chief Complaint:  Subjective/Chief Complaint patient admited with n/v abdominal pain.  abnormal ct with ileitis.  feeling a little better today, passing some flatus, small watery stool x1.  denies abdominal pain.   VITAL SIGNS/ANCILLARY NOTES: **Vital Signs.:   28-Mar-15 13:32  Vital Signs Type Routine  Celsius 36.6  Temperature Source oral  Pulse Pulse 84  Respirations Respirations 19  Systolic BP Systolic BP 419  Diastolic BP (mmHg) Diastolic BP (mmHg) 84  Mean BP 109  Pulse Ox % Pulse Ox % 96  Pulse Ox Activity Level  At rest  Oxygen Delivery Room Air/ 21 %  *Intake and Output.:   28-Mar-15 06:12  NG Suction ml     Out:  600    Shift 07:00  NG Suction ml     Out:  600    Daily 07:00  NG Suction ml     Out:  1300    15:02  NG Suction ml     Out:  200    Shift 23:00  NG Suction ml     Out:  200   Brief Assessment:  Cardiac Regular   Respiratory clear BS   Gastrointestinal details normal Soft  No rebound tenderness  minimal lower abdominal tenderness.  bowel sounds positive. mild distension   Lab Results: Routine Micro:  27-Mar-15 16:10   Micro Text Report STOOL COMPREHENSIVE   COMMENT                   NO SALMONELLA OR SHIGELLA ISOLATED   COMMENT                   NO PATHOGENIC E.COLI DETECTED   COMMENT                   POSITIVE CAMPYLOBACTER AG (JEJUNI/COLI)   ANTIBIOTIC                        Micro Text Report CLOSTRIDIUM DIFFICILE   C.DIFFICILE ANTIGEN       C.DIFFICILE GDH ANTIGEN : NEGATIVE   C.DIFFICILE TOXIN A/B     C.DIFFICILE TOXINS A AND B : NEGATIVE   INTERPRETATION            Negative for C. difficile.    ANTIBIOTIC                        Culture Comment NO SALMONELLA OR SHIGELLA ISOLATED  Culture Comment . NO PATHOGENIC E.COLI DETECTED  Culture Comment    . POSITIVE CAMPYLOBACTER AG (JEJUNI/COLI)  Routine Chem:  27-Mar-15 16:10   Result Comment POSITIVE CAMPY - CTJ TO JASMINE LAMB @ 1050 ON 05-24-13.  - READ-BACK PROCESS PERFORMED.  -  PRINTED TO INFECTION CONTROL.  - FAXED TO HEALTH DEPARTMENT. STOOL CULTURE - NORMAL ENTERIC FLORA REDUCED  Result(s) reported on 25 May 2013 at 01:40PM.  Routine Hem:  24-Mar-15 21:21   WBC (CBC)  18.2  26-Mar-15 05:56   WBC (CBC) 10.5   Radiology Results: XRay:    28-Mar-15 10:10, Abdomen Flat and Erect  Abdomen Flat and Erect   REASON FOR EXAM:    f/u sbo  COMMENTS:       PROCEDURE: DXR - DXR ABDOMEN 2 V FLAT AND ERECT  - May 25 2013 10:10AM     CLINICAL DATA:  Abdominal pain, follow-up small bowel obstruction.    EXAM:  ABDOMEN - 2 VIEW    COMPARISON:  05/23/2013    FINDINGS:  NG tube remains in place. The tip is in the region of the distal  stomach. Gas within mildly prominent mid abdominal small bowel  loops. Small bowel distention has significantly improved since prior  study. Gas is now present within the transverse colon and splenic  flexure.    Visualized lung bases are clear.    No free air organomegaly.     IMPRESSION:  Improving small bowel obstruction pattern.  Colonic gas now present.      Electronically Signed    By: Rolm Baptise M.D.    On: 05/25/2013 14:53     Verified By: Raelyn Number, M.D.,  CT:    24-Mar-15 23:33, CT Abdomen and Pelvis With Contrast  CT Abdomen and Pelvis With Contrast   REASON FOR EXAM:    (1) RLQ pain, vomiting, leukocytosis; (2) RLQ pain,   vomiting, leukocytosis  COMMENTS:       PROCEDURE: CT  - CT ABDOMEN / PELVIS  W  - May 21 2013 11:33PM     CLINICAL DATA:  Lower abdominal pain for 1 day, nausea and vomiting.  Leukocytosis.    EXAM:  CT ABDOMEN AND PELVIS WITH CONTRAST    TECHNIQUE:  Multidetector CT imaging of the abdomen and pelvis was performed  using the standard protocol following bolus administration of  intravenous contrast.    CONTRAST:  100 cc of Isovue 370    COMPARISON:  Included view of the lung bases demonstrate dependent  atelectasis. . Visualized heart and pericardium are  unremarkable.    The liver, spleen, gallbladder, pancreas and adrenal glands are  unremarkable.    Inflammatory changes of the terminal ileum, with fecalized small  bowel contents, distal ileum bowel loop measures up to 4.7 cm in  transaxial dimension. Extensive cecal and ascending colonic wall  edema with surrounding inflammatory changes. Status post  appendectomy.No pneumatosis or bowel perforation. Trace free fluid  in the right pericolic gutter without abscess or pneumoperitoneum.    The stomach, and proximal small bowel are normal in course and  caliber without inflammatory changes.    Kidneys are orthotopic, demonstrating symmetric enhancement ; 2 mm  nonobstructing left interpolar renal calculus. Too small to  characterize hypodensity in the right kidney. Partially distended  and unremarkable.    Aortoiliac vessels are normal in course and caliber with moderate  calcific atherosclerosis. . No lymphadenopathy by CT size criteria.  Internal reproductive organs are unremarkable. The soft tissues and  included osseous structures are nonsuspicious. Mild degenerative  change of the hips. Mild degenerative changes lumbar spine.  FINDINGS:  Terminal ileal and cecal/ascending colonic wall edema and  inflammatory changes most consistent with enterocolitis, this could  be infectious or, less likely inflammatory in nature. No bowel  perforation or abscess. Status post appendectomy. Dilated distal  ileum with small bowel feces suggesting chronic stasis without frank  bowel obstruction.      Electronically Signed    By: Elon Alas    On: 05/22/2013 00:04     CLINICAL DATA:  Lower abdominal pain for 1 day, nausea and vomiting.  Leukocytosis.  EXAM:  CT ABDOMEN AND PELVIS WITH CONTRAST    TECHNIQUE:  Multidetector CT imaging of the abdomen and pelvis was performed  using the standard protocol following bolus administration of  intravenous contrast.    CONTRAST:  100 cc  of Isovue 370    COMPARISON:  Included view of the lung bases demonstrate dependent  atelectasis. . Visualized heart and pericardium are unremarkable.    The liver, spleen, gallbladder, pancreas and adrenal glands are  unremarkable.  Inflammatory changes of the terminal ileum, with fecalized small  bowel contents, distal ileum bowel loop measures up to 4.7 cm in  transaxial dimension. Extensive cecal and ascending colonic wall  edema with surrounding inflammatory changes. Status post  appendectomy. No pneumatosis or bowel perforation. Trace free fluid  in the right pericolic gutter without abscess or pneumoperitoneum.    The stomach, and proximal small bowel are normal in course and  caliber without inflammatory changes.    Kidneys are orthotopic, demonstrating symmetric enhancement ; 2 mm  nonobstructing left interpolar renal calculus. Too small to  characterize hypodensity in the right kidney. Partially distended  and unremarkable.  Aortoiliac vessels are normal in course and caliber with moderate  calcific atherosclerosis. . No lymphadenopathy by CT size criteria.  Internal reproductive organs are unremarkable. The soft tissues and  included osseous structures are nonsuspicious. Mild degenerative  change of the hips. Mild degenerative changes lumbar spine.    FINDINGS:  Terminal ileal and cecal/ascending colonic wall edema and  inflammatory changes most consistent with enterocolitis, this could  be infectious or, less likely inflammatory in nature. No bowel  perforation or abscess. Status post appendectomy. Dilated distal  ileum with small bowel feces suggesting chronic stasis without frank  bowel obstruction.    Electronically Signed    By: Elon Alas    On: 05/22/2013 00:04         Verified By: Ricky Ala, M.D.,   Assessment/Plan:  Assessment/Plan:  Assessment 1) abdominal pain, n/v improving.  abnormal ct showing coltiis/enterocolitis with  inflammation of the terminal ileum.  Evidence of partial sbo improving on serial films, wbc improving on abx.  stool culture positive for campylobacter. 2) egd showed severe erosive esophagitis and some stricture/dilated.  severe esophagitis likely multifactorial in light of the presentation.   Plan 1) continue current, daily 2 way abd film.  Will consider colonoscopy, depending on clinical course, probably outpatient.  following.  will d/c IBP   Electronic Signatures: Loistine Simas (MD)  (Signed 28-Mar-15 17:38)  Authored: Chief Complaint, VITAL SIGNS/ANCILLARY NOTES, Brief Assessment, Lab Results, Radiology Results, Assessment/Plan   Last Updated: 28-Mar-15 17:38 by Loistine Simas (MD)

## 2014-06-21 NOTE — Discharge Summary (Signed)
PATIENT NAME:  Larry Lane, TETTERTON MR#:  [id number] DATE OF BIRTH:  11/22/1940  DATE OF ADMISSION:  05/22/2013 DATE OF DISCHARGE: 05/29/2013   PRIMARY CARE PHYSICIAN: Dr. Laurian Brim.   CONSULTANTS: Dr. Ola Spurr from infectious disease, Dr. Marina Gravel, Dr. Rexene Edison and Dr. Leanora Cover from surgery. Dr. Verdie Shire and Dr. Gustavo Lah from GI.   CHIEF COMPLAINT: Abdominal pain, nausea and vomiting.   DISCHARGE DIAGNOSES:  1.  Enteritis/colitis, likely from Campylobacter, but inflammatory bowel disease not fully excluded. 2.  Partial small bowel obstruction, resolved.  3.  Diabetes.  4.  Hypertension.  5.  Severe esophagitis and stricture, status post dilation.  6.  History of trigeminal neuralgia.   DISCHARGE MEDICATIONS: Atenolol 50 mg daily, acetaminophen/hydrocodone 300/5 mg 1 tab every 6 hours as needed for pain, Lantus 22 units at bedtime, dicyclomine 10 mg 3 times a day, cholestyramine 4 grams per 5 gram oral powder 3 times a day, ranitidine 150 mg once a day, fish oil 1000 mg 2 times a day, probiotic one cap once a day, aspirin 325 mg 1 tab once a day, Humalog subcu as previously used, pantoprazole 40 mg 1 tab 2 times a day for 4 to 6 weeks, then can take daily, amlodipine 10 mg daily, Levaquin 750 mg every 24 hours for two days and Flagyl 500 mg 1 tab every 8 hours every for 2 days.   DIET: Low sodium, low fat, low cholesterol, ADA diet. Soft diet for a week, then regular consistency.   ACTIVITY: As tolerated.   FOLLOWUP: Please follow with PCP within 1 to 2 weeks. Please follow with GI within 2 to 4 weeks for evaluation and scheduling for colonoscopy.   DISPOSITION: Home.   CODE STATUS: The patient is a full code.  SIGNIFICANT LABS AND IMAGING: Initial BUN 18, creatinine 1.15, sodium 135. LFTs on arrival within normal limits. Initial white count of 18.2, hemoglobin of 12.4, platelets of 353, last white count of 6.8, hemoglobin of 12.2. Blood cultures on arrival, no growth to date. Stool  cultures did show Campylobacter and C. diff. was negative. Urinalysis did not suggest an infection. CT abdomen and pelvis with contrast had shown terminal ileal and cecal/ascending colon wall edema and inflammatory changes consistent with enterocolitis. This could be infectious or less likely inflammatory. No bowel perforation or abscess. Dilated distal ileum with a small bowel feces suggesting chronic stasis without frank bowel obstruction. Serial abdominal x-rays, the last one of which showed stable bowel gas pattern and persistent partial small bowel obstruction. Last x-ray was from March 29.   HISTORY OF PRESENT ILLNESS AND HOSPITAL COURSE: For full details of H and P, please see the dictation on 03/25 by Dr. Lunette Stands, but briefly this is a pleasant 74 year old Caucasian male with a history of diabetes, trigeminal neuralgia, hypertension, who has been having some chronic abdominal issues, but came in acutely for abdominal pain, worse than prior without radiation with vomiting and a couple of episodes of diarrhea. He came in and was found with a CT suggesting colitis as dictated above. He was started on IV fluids, antibiotics and surgery was consulted, who initially thought that this was not a surgical issue acutely. He was admitted to the hospitalist service with a GI consult for the colitis. Blood and stool cultures were sent and blood cultures were no growth to date. However, the stool cultures did grow Campylobacter. He was seen by GI, Dr. Candace Cruise and Dr. Gustavo Lah. He underwent an EGD, which shows esophagitis and stricter, status  post dilation and he will need to be on a PPI b.i.d. for that; however, in regards to the Campylobacter and partial small bowel obstruction, he was serially examined and had serial abdominal films. The SBO did resolve. The nausea, vomiting and the diarrhea did resolve and he was seen by infectious disease, Dr. Ola Spurr. The recommendation is to discharge him with 10 days of therapy  with antibiotics. At this point, he is on day 8 of Levaquin and Flagyl. He has been tolerating diet. His abdominal small bowel obstruction has resolved with conservative management. He is moving stools. No nausea or vomiting. He did require an NG tube, which has been discontinued for several days. Recommendation is for a CAT scan with p.o. contrast as an outpatient. He did have elevated blood pressure as he was on fluids. He stated has not seen his PCP, Dr. Hardin Negus, in quite a while. He was started on amlodipine and titrated up to 10 mg dose. At this point, he has been off fluids and he was instructed to follow with his PCP for a blood pressure check and general followup. His atenolol was resumed. He did have fever with chills and rigors, which I suspect is secondary to above, and they have resolved.   PHYSICAL EXAMINATION:   VITAL SIGNS: On the day of discharge, he is ambulating, tolerating a soft diet, temperature is 98.2, pulse rate 70, respiratory rate 18, blood pressure 149/82 to 170/88, O2 was 96% on room air.  GENERAL: The patient is a well-developed male lying in bed in no obvious distress.  HEENT: Normocephalic, atraumatic.  LUNGS: Clear to auscultation.  ABDOMEN: Soft, nontender, nondistended. Normoactive bowel sounds in all quadrants.  HEART: Normal S1, S2. The patient has no significant lower extremity edema.   TOTAL TIME SPENT: 35 minutes.  ____________________________ Vivien Presto, MD sa:aw D: 05/29/2013 11:52:25 ET T: 05/29/2013 12:37:29 ET JOB#: [id number]  cc: Vivien Presto, MD, <Dictator> Morton Peters., MD Lupita Dawn. Candace Cruise, MD Vivien Presto MD ELECTRONICALLY SIGNED 06/12/2013 10:14

## 2014-06-21 NOTE — Consult Note (Signed)
Chief Complaint:  Subjective/Chief Complaint seen for ileitis.  feeling better today, tolerating clears.  denies abd pain or nausea.   VITAL SIGNS/ANCILLARY NOTES: **Vital Signs.:   30-Mar-15 13:48  Vital Signs Type Routine  Temperature Temperature (F) 99.3  Celsius 37.3  Temperature Source oral  Pulse Pulse 72  Respirations Respirations 18  Systolic BP Systolic BP 449  Diastolic BP (mmHg) Diastolic BP (mmHg) 71  Mean BP 102  Pulse Ox % Pulse Ox % 97  Pulse Ox Activity Level  At rest  Oxygen Delivery Room Air/ 21 %   Brief Assessment:  Cardiac Regular   Respiratory clear BS   Gastrointestinal details normal Soft  Nontender  Nondistended  No masses palpable  Bowel sounds normal   Lab Results: Routine Micro:  27-Mar-15 16:10   Micro Text Report CLOSTRIDIUM DIFFICILE   C.DIFFICILE ANTIGEN       C.DIFFICILE GDH ANTIGEN : NEGATIVE   C.DIFFICILE TOXIN A/B     C.DIFFICILE TOXINS A AND B : NEGATIVE   INTERPRETATION            Negative for C. difficile.    ANTIBIOTIC                        Routine Chem:  27-Mar-15 16:10   Result Comment POSITIVE CAMPY - CTJ TO JASMINE LAMB @ 1050 ON 05-24-13.  - READ-BACK PROCESS PERFORMED.  - PRINTED TO INFECTION CONTROL.  - FAXED TO HEALTH DEPARTMENT. STOOL CULTURE - NORMAL ENTERIC FLORA REDUCED  Result(s) reported on 26 May 2013 at 10:31AM.  30-Mar-15 03:36   Glucose, Serum  186  BUN  23  Creatinine (comp) 0.72  Sodium, Serum 142  Potassium, Serum 3.9  Chloride, Serum  112  CO2, Serum 23  Calcium (Total), Serum  8.1  Anion Gap 7  Osmolality (calc) 292  eGFR (African American) >60  eGFR (Non-African American) >60 (eGFR values <76m/min/1.73 m2 may be an indication of chronic kidney disease (CKD). Calculated eGFR is useful in patients with stable renal function. The eGFR calculation will not be reliable in acutely ill patients when serum creatinine is changing rapidly. It is not useful in  patients on dialysis. The eGFR  calculation may not be applicable to patients at the low and high extremes of body sizes, pregnant women, and vegetarians.)  Routine Hem:  30-Mar-15 03:36   WBC (CBC) 6.8  RBC (CBC)  4.19  Hemoglobin (CBC)  12.2  Hematocrit (CBC)  36.0  Platelet Count (CBC) 355  MCV 86  MCH 29.1  MCHC 33.9  RDW 13.1  Neutrophil % 72.4  Lymphocyte % 13.1  Monocyte % 10.7  Eosinophil % 3.4  Basophil % 0.4  Neutrophil # 4.9  Lymphocyte #  0.9  Monocyte # 0.7  Eosinophil # 0.2  Basophil # 0.0 (Result(s) reported on 27 May 2013 at 04:11AM.)   Assessment/Plan:  Assessment/Plan:  Assessment 1) acute diarrheal illness with partial sbo, entercolitis, campylobacter.  doing well, no further pain or diarrhea, now on clears.   Plan 1) finish 10 day course of abx.  no plans for colonoscopy as inpatient.  Will arrange for o/p colonoscopy  when seen in GI fu (2 weeks from now).  advance diet to full liquids tomorrow.  discussed with Dr ABridgette Habermann   Electronic Signatures: SLoistine Simas(MD)  (Signed 30-Mar-15 17:59)  Authored: Chief Complaint, VITAL SIGNS/ANCILLARY NOTES, Brief Assessment, Lab Results, Assessment/Plan   Last Updated: 30-Mar-15 17:59 by SLoistine Simas(MD)

## 2014-06-21 NOTE — Consult Note (Signed)
Feels better with NG suction. Is able to pass some liquid stools. Abd less distended. Some bowel sounds today. 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 Gastritis also present. GE junction dilated with TTS balloon. Keep NG in suction. Protonix IV bid ordered. Serial abd series. Continue Abx. Will order IBD panel in case patient has IBD. Will eventually need colonoscopy once patient able to tolerate bowel prep. I will be out next week. Dr. Gustavo Lah will see patient over the weekend. Thanks.  Electronic Signatures: Verdie Shire (MD)  (Signed on 27-Mar-15 10:31)  Authored  Last Updated: 27-Mar-15 10:31 by Verdie Shire (MD)

## 2014-06-21 NOTE — Consult Note (Signed)
PATIENT NAME:  Larry Lane, Larry Lane MR#:  [id number] DATE OF BIRTH:  October 14, 1940  DATE OF CONSULTATION:  05/22/2013  REFERRING PHYSICIAN:   CONSULTING PHYSICIAN:  Payton Emerald, NP  ADDENDUM:    The patient's presentation was discussed with Dr. Verdie Shire. At this time, will tentatively plan on proceeding forward with endoscopic evaluation for Friday of this week. The patient will be scheduled for a diagnostic colonoscopy based on CT scan findings as well as patient's symptoms. As well, we will proceed with an upper endoscopy with dilatation given his complaint of dysphagia as well as prior findings of stenosis involving duodenum. In agreement for the patient is to continue with antibiotic therapy at this time. We will closely monitor the patient with his fever having been present today up to 103. Pending again chest x-ray, PA and lateral, in an attempt to find an infectious source.   These services provided by Payton Emerald, MS, APRN, [facility], FNP, under collaborative agreement Dr. Verdie Shire.    ____________________________ Payton Emerald, NP dsh:cs D: 05/22/2013 14:44:58 ET T: 05/22/2013 18:21:58 ET JOB#: [id number]  cc: Payton Emerald, NP, <Dictator> Payton Emerald MD ELECTRONICALLY SIGNED 05/23/2013 7:53

## 2014-06-21 NOTE — Consult Note (Signed)
PATIENT NAME:  Larry Lane, Larry Lane MR#:  [id number] DATE OF BIRTH:  [birth year]  DATE OF CONSULTATION:  05/22/2013  REFERRING PHYSICIAN:   CONSULTING PHYSICIAN:  Consuela Mimes, MD  HISTORY OF PRESENT ILLNESS: Mr. Fussell is a 74 year old white male who began experiencing right lower quadrant abdominal pain yesterday associated with nausea and vomiting. He denies fever. He had a bowel movement about 5 hours ago and it was small but otherwise normal. He underwent laparoscopic appendectomy in July 2010 and at that time had a 12 mm appendix on CT scan, and I have reviewed his operative note and pathology findings. Although he did not have any evidence of inflammatory bowel disease at the time of his surgery, his pathology also did not reveal any evidence of inflammatory bowel disease, he did have an acute suppurative appendicitis and the appendix was 43 mm long at that time. The patient states that for about the last 4 years, he has had fairly severe chronic diarrhea. At one time, it was pretty much all day long, but more recently, it is just in the morning and at night. He denies hematochezia, and he also denies having a lot of mucus in his stool. Currently, his nausea is significantly improved, and his pain is improved with analgesics that he has received in the Emergency Department but it has not completely resolved.   PAST MEDICAL HISTORY: Insulin-dependent diabetes mellitus, trigeminal neuralgia, dyslipidemia, hypertension.   MEDICATIONS: Norco, aspirin, atenolol, cholestyramine, dicyclomine, fish oil, Humalog, Lantus, probiotic, ranitidine.   ALLERGIES: None.   REVIEW OF SYSTEMS: Negative for 10 systems except gastrointestinal system as mentioned in the history of present illness.   FAMILY HISTORY: Noncontributory.   SOCIAL HISTORY: The patient is married and lives at home with his wife. 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 He has never smoked cigarettes and has never drunk alcohol.    PHYSICAL EXAMINATION:  GENERAL: Healthy, middle-aged to elderly white male lying comfortably on the Emergency Department stretcher in no acute distress.  VITAL SIGNS: Height 5 feet 6 inches, weight 175 pounds, BMI 28.3, temperature 98.8, pulse 71, respirations 20, blood pressure 109/59, oxygen saturation 98% on room air at rest.  HEENT: Pupils equally round and reactive to light. Extraocular movements intact. Sclerae nonicteric. Oropharynx clear. Mucous membranes moist. Hearing intact to voice.  NECK: Supple with no jugular venous distention or tracheal deviation.  HEART: Regular rate and rhythm with no murmurs or rubs.  LUNGS: Clear to auscultation with normal respiratory effort bilaterally.  ABDOMEN: Flat and fairly hard but nontender, including to deep palpation in the right lower quadrant. It is also nondistended.  EXTREMITIES: No edema with normal capillary refill bilaterally.  NEUROLOGIC: Cranial nerves II through XII, motor and sensation grossly intact.  PSYCHIATRIC: Alert and oriented x 4. Appropriate affect.   OTHER STUDIES: White blood cell count 18,000, hemoglobin 12, hematocrit 38, platelet count normal. Urinalysis normal. Electrolytes normal with a creatinine of 1.15. Liver function tests and lipase are normal. CT scan of the abdomen and pelvis with IV and oral contrast (reviewed by myself) reveals extensive inflammatory changes of the terminal ileum with circumferential bowel wall thickening and distention along with cecal and ascending colon wall edema and surrounding inflammatory changes. The patient is status post appendectomy. There is no pneumoperitoneum or other evidence of bowel perforation.   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 The patient has undergone appendectomy and although he had no  evidence of Crohn's disease 4-1/2 years ago, it appears that he has been suffering from this for about the last 4  years by history.   PLAN: We will follow the patient with you, but the likelihood of him needing an acute surgical intervention is indeed quite low. He may benefit from a localized resection and primary anastomosis at some point in the future but only if he fails medical management.    ____________________________ Consuela Mimes, MD wfm:gb D: 05/22/2013 00:52:40 ET T: 05/22/2013 03:25:32 ET JOB#: [id number]  cc: Consuela Mimes, MD, <Dictator> Consuela Mimes MD ELECTRONICALLY SIGNED 05/23/2013 6:57

## 2014-06-21 NOTE — Consult Note (Signed)
Chief Complaint:  Subjective/Chief Complaint seen for ileitis, partial sbo, abd pain,n/v.  Improved from yesterday.  denies abdominal pain or nausea.   VITAL SIGNS/ANCILLARY NOTES: **Vital Signs.:   29-Mar-15 13:15  Vital Signs Type Routine  Temperature Temperature (F) 98.8  Celsius 37.1  Temperature Source oral  Respirations Respirations 16  Systolic BP Systolic BP 627  Diastolic BP (mmHg) Diastolic BP (mmHg) 92  Mean BP 116  Pulse Ox % Pulse Ox % 95  Pulse Ox Activity Level  At rest  Oxygen Delivery Room Air/ 21 %  *Intake and Output.:   Daily 29-Mar-15 07:00  NG Suction ml     Out:  250    07:03  NG Suction ml     Out:  125    07:51  NG Suction ml     Out:  75    Shift 15:00  NG Suction ml     Out:  200    16:59  NG Suction ml     Out:  50    Shift 23:00  NG Suction ml     Out:  50   Brief Assessment:  Cardiac Regular   Respiratory clear BS   Gastrointestinal details normal Soft  Nontender  Nondistended  No masses palpable  bs positive   Lab Results: Routine Chem:  27-Mar-15 16:10   Result Comment POSITIVE [facility] - CTJ TO JASMINE LAMB @ 1050 ON 05-24-13.  - READ-BACK PROCESS PERFORMED.  - PRINTED TO INFECTION CONTROL.  - FAXED TO HEALTH DEPARTMENT. STOOL CULTURE - NORMAL ENTERIC FLORA REDUCED  Result(s) reported on 26 May 2013 at 10:31AM.   Radiology Results: XRay:    29-Mar-15 08:15, Abdomen Flat and Erect  Abdomen Flat and Erect   REASON FOR EXAM:    partial sbo, please compare with previous film.  COMMENTS:       PROCEDURE: DXR - DXR ABDOMEN 2 V FLAT AND ERECT  - May 26 2013  8:15AM     CLINICAL DATA:  Partial small bowel obstruction and colitis.    EXAM:  ABDOMEN - 2 VIEW    COMPARISON:  DG ABDOMEN 2V dated 05/25/2013; CT ABD-PELV W/ CM dated  05/21/2013; DG ABDOMEN 2V dated 05/23/2013    FINDINGS:  Nasogastric tube in the distal stomach region. No evidence for free  air. The bowel gas pattern is unchanged. There continues to be gas  in  the transverse colon and a few gas-filled loops of bowel in the  mid abdomen. Small amount of gas in the distal colon.     IMPRESSION:  Stable bowel gas pattern. There may be a persistent partial small  bowel obstruction.      Electronically Signed    By: Markus Daft M.D.    On: 05/26/2013 08:18         Verified By: Burman Riis, M.D.,   Assessment/Plan:  Assessment/Plan:  Assessment 1) partial sbo with ileitis, stool culture positive for campylobacter.  improving, no pain today, lessening ngt outpus, passing flatus. 2) severe erosive esophagitis   Plan 1) continue ppi bid for 4-6 weeks, then continue once daily 2) colonoscopy probably as outpatient.  woudl consider repeat ct 7-10 days from the last with po contrast.  following.   Electronic Signatures: Loistine Simas (MD)  (Signed 29-Mar-15 17:16)  Authored: Chief Complaint, VITAL SIGNS/ANCILLARY NOTES, Brief Assessment, Lab Results, Radiology Results, Assessment/Plan   Last Updated: 29-Mar-15 17:16 by Loistine Simas (MD)

## 2014-06-21 NOTE — Consult Note (Signed)
Pt seen and examined. Please see Dawn Harrison's notes. Pt with dysphagia and hx of duodenal stenosis. CT shows inflamm in TI and cecum. Nausea present. Agree with Abx. Will tentatively plan EGD with dilation and colonoscopy on Fri if patient no longer febrile and able to tolerate bowel prep. WIll follow. Thanks.  Electronic Signatures: Verdie Shire (MD)  (Signed on 25-Mar-15 16:00)  Authored  Last Updated: 25-Mar-15 16:00 by Verdie Shire (MD)

## 2014-06-21 NOTE — Consult Note (Signed)
PATIENT NAME:  Larry Lane, Larry Lane MR#:  [id number] DATE OF BIRTH:  12/04/1940  DATE OF CONSULTATION:  05/23/2013  REFERRING PHYSICIAN:   CONSULTING PHYSICIAN:  Cheral Marker. Ola Spurr, MD  REASON FOR CONSULTATION: Fever, leukocytosis, chills and colitis.   HISTORY OF PRESENT ILLNESS: This is a pleasant 74 year old gentleman with history of chronic diarrhea as well as insulin dependent diabetes and trigeminal neuralgia who was admitted March 25th with abdominal pain, nausea and vomiting. CT scan showed evidence of thickening in his terminal ileum and colitis. He was admitted and seen by surgery and GI. He has been treated with IV meropenem. We are consulted for further evaluation and antibiotic assistance.   Currently, the patient reports he is quite nauseous. He has been vomiting a lot today. He has been getting around the clock antiemetics. He is continuing to have abdominal pain. He is having some bowel movements, mainly watery. The patient is scheduled for possible EGD and colonoscopy tomorrow.   PAST MEDICAL HISTORY: 1.  Insulin-dependent diabetes.  2.  Trigeminal neuralgia.  3.  Chronic diarrhea, not yet worked up as an outpatient.   PAST SURGICAL HISTORY:  1.  Appendectomy.  2.  Multiple surgeries for trigeminal neuralgia.   SOCIAL HISTORY: The patient is married. 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 He does not smoke or drink. He does not use drugs. He has no pets, no animal  exposures.   FAMILY HISTORY: Positive for diabetes.   REVIEW OF SYSTEMS: Eleven systems reviewed and negative, except as per HPI.   ALLERGIES: No known drug allergies.   ANTIBIOTICS SINCE ADMISSION: Include meropenem. He also received Zosyn on March 24th.   PHYSICAL EXAMINATION: VITAL SIGNS: T-max 100.3, current 99. Temperature on the 25th maxed out at 103, in the morning. Pulse 91, blood pressure 121/78, pulse ox 94% on room air, respirations 18.  GENERAL: The patient is quite  uncomfortable. He is having chronic nausea and a sick feeling. He is leaning over as if he is going to vomit. HEENT: Pupils equal, round, and reactive to light and accommodation. Extraocular movements are intact. Sclerae are anicteric. Oropharynx is clear.  NECK: Supple.  HEART: Regular.  LUNGS: Clear.  ABDOMEN: Distended, tympanic, decreased bowel sounds, mild diffuse tenderness.  EXTREMITIES: No clubbing, cyanosis or edema.  NEUROLOGIC: He is alert and oriented x3. Grossly nonfocal neuro exam.   DIAGNOSTIC DATA: Chest x-ray done March 25th shows bowel gas pattern consistent with small bowel obstruction. Lungs are clear.   CT of the abdomen March 24th showed terminal ileal and cecal ascending colonic wall edema and inflammatory changes consistent with enterocolitis, which could be infectious or inflammatory in nature. No bowel perforation or abscess. He is status post appendectomy. There is dilated distal ileum with small bowel feces suggesting chronic stasis without frank bowel obstruction.   White blood count on admission was 18.2 and now down to 10.5, hemoglobin 11.8, and platelet count is 252,000. Blood cultures x2, March 25th, are negative. Urinalysis showed 2 white cells. LFTs are on admission, March 24th. Renal function shows a creatinine of 1.06.   IMPRESSION: A 74 year old with chronic diarrhea for the last several years now admitted with nausea, vomiting, fever and diarrhea. CT suggests inflammation of the terminal ileum and colon. His fever and white count have decreased with the meropenem he is currently on. However, his bowels seem to be obstructed with chronic vomiting at this point over the last several days.   RECOMMENDATIONS: I suspect he  has developed a small bowel obstruction. I have put in for 2-view of his abdomen. I paged surgery on call to re-evaluate and consider if he needs a NG tube.   At this point, I would continue the meropenem until his gastrointestinal issues are  resolved. I have also put in for stool ova and parasite, stool culture and Clostridium difficile.   Thank you for the consult. I will be glad to follow with you.   ____________________________ Cheral Marker. Ola Spurr, MD dpf:sb D: 05/23/2013 10:34:42 ET T: 05/23/2013 11:10:28 ET JOB#: [id number]  cc: Cheral Marker. Ola Spurr, MD, <Dictator> Laroy Mustard Ola Spurr MD ELECTRONICALLY SIGNED 06/09/2013 16:21

## 2014-06-21 NOTE — Consult Note (Signed)
X-ray shows persistent SBO pattern. NG in place. Will do EGD tomorrow AM with possible dilation with NG in place. However, will postpone colonoscopy until later. Will hold bowel prep. thanks.  Electronic Signatures: Verdie Shire (MD)  (Signed on 26-Mar-15 14:32)  Authored  Last Updated: 26-Mar-15 14:32 by Verdie Shire (MD)

## 2014-06-23 ENCOUNTER — Ambulatory Visit (INDEPENDENT_AMBULATORY_CARE_PROVIDER_SITE_OTHER): Payer: Medicare Other | Admitting: Podiatry

## 2014-06-23 ENCOUNTER — Encounter: Payer: Self-pay | Admitting: Podiatry

## 2014-06-23 VITALS — BP 149/72 | HR 57 | Resp 16

## 2014-06-23 DIAGNOSIS — M19079 Primary osteoarthritis, unspecified ankle and foot: Secondary | ICD-10-CM

## 2014-06-23 DIAGNOSIS — M7750 Other enthesopathy of unspecified foot: Secondary | ICD-10-CM

## 2014-06-23 MED ORDER — MELOXICAM 15 MG PO TABS: 15.0000 mg | ORAL_TABLET | Freq: Every day | ORAL | Status: DC

## 2014-06-23 NOTE — Progress Notes (Signed)
He presents today states that maybe the arthritis in her ankles and some better but is not much better. He continues to take the Aleve on a regular basis.  Objective: Vital signs are stable alert and oriented 3 still has pain on palpation anterior ankle where bilateral.  Assessment: Capsulitis degenerative joint disease ankles bilateral.  Plan: Started him on meloxicam will consider referring him to Dr. Carman Ching next time.

## 2014-07-21 ENCOUNTER — Ambulatory Visit: Payer: Medicare Other | Admitting: Podiatry

## 2014-10-13 ENCOUNTER — Other Ambulatory Visit: Payer: Self-pay | Admitting: *Deleted

## 2014-10-13 MED ORDER — MELOXICAM 15 MG PO TABS: 15.0000 mg | ORAL_TABLET | Freq: Every day | ORAL | Status: DC

## 2014-10-13 NOTE — Telephone Encounter (Signed)
Larry Lane sent a refill request for Larry Lane.  Dr. Marta Antu the refill plus 2 additional refills.

## 2015-01-08 ENCOUNTER — Telehealth: Payer: Self-pay

## 2015-01-08 ENCOUNTER — Telehealth: Payer: Self-pay | Admitting: *Deleted

## 2015-01-08 NOTE — Telephone Encounter (Signed)
Sure go ahead and refer to Dr. Jacqualyn Posey if he is not improving.

## 2015-01-08 NOTE — Telephone Encounter (Addendum)
Request for Meloxicam 15mg  #30 take one table q day.   I spoke with pt's wife and informed we were referring to DR. Wagoner at Dr. Stephenie Acres recommendation and refilling the Meloxicam to get him hopefully comfortably to the appt.  Transferred to schedulers.

## 2015-01-08 NOTE — Telephone Encounter (Signed)
Dr. Guerry Bruin nurse called at this time and states that this patient is in their office currently. He had a piece of farm equipment fall on his left upper back approximately 2 weeks ago,but did not break skin. Then has developed an abscess in this area now. Nurse denies this being a hematoma but states that they have obtained a culture of this, have received results (she could not find results at this time), but have started patient on Augmentin. Dr. Rosario Jacks is very concerned and would like a surgeon to see this patient as soon as possible. Nurse denies patient having fever or any other symptoms but did state that abscess is large and has many pustules around this area. Patient placed on schedule.

## 2015-01-09 ENCOUNTER — Encounter: Payer: Self-pay | Admitting: Surgery

## 2015-01-09 ENCOUNTER — Ambulatory Visit (INDEPENDENT_AMBULATORY_CARE_PROVIDER_SITE_OTHER): Payer: Medicare Other | Admitting: Surgery

## 2015-01-09 VITALS — BP 126/78 | HR 68 | Temp 98.7°F | Ht 66.0 in | Wt 178.2 lb

## 2015-01-09 DIAGNOSIS — L02219 Cutaneous abscess of trunk, unspecified: Secondary | ICD-10-CM | POA: Diagnosis not present

## 2015-01-09 DIAGNOSIS — L03319 Cellulitis of trunk, unspecified: Secondary | ICD-10-CM

## 2015-01-09 MED ORDER — MELOXICAM 15 MG PO TABS: 15.0000 mg | ORAL_TABLET | Freq: Every day | ORAL | Status: DC

## 2015-01-09 NOTE — Patient Instructions (Signed)
Please follow-up in our office on Monday in [place].  Keep this area covered with a dressing just as you are doing now. If you develop a fever, please call the office.   Continue taking your antibiotics.

## 2015-01-09 NOTE — Progress Notes (Signed)
  Surgical Consultation  01/09/2015  Larry Lane is an 74 y.o. male.   CC: back abscess  HPI:  Patient had I&D of a back abscess yesterday. Is here for follow-up and evaluation in our surgical practice. The I&D was performed at his dermatologist's office. He's had no fevers or chills no nausea or vomiting and is currently on antibiotics.    No past medical history on file.  No past surgical history on file.  No family history on file.  Social History:  reports that he has never smoked. He does not have any smokeless tobacco history on file. He reports that he does not drink alcohol. His drug history is not on file.  Allergies: No Known Allergies  Medications reviewed.   Review of Systems:   Review of Systems  Constitutional: Negative for fever, chills and diaphoresis.  HENT: Negative.   Eyes: Negative.   Respiratory: Negative.   Cardiovascular: Negative.   Gastrointestinal: Negative.   Genitourinary: Negative.   Musculoskeletal: Negative.   Skin: Negative.   Neurological: Negative.   Endo/Heme/Allergies: Negative.   Psychiatric/Behavioral: Negative.      Physical Exam:  There were no vitals taken for this visit.  Physical Exam  Constitutional: He is oriented to person, place, and time and well-developed, well-nourished, and in no distress. No distress.  HENT:  Head: Normocephalic and atraumatic.  Eyes: Pupils are equal, round, and reactive to light. Right eye exhibits no discharge. Left eye exhibits no discharge. No scleral icterus.  Neck: Normal range of motion. Neck supple.  Cardiovascular: Normal rate and regular rhythm.   Pulmonary/Chest: Effort normal and breath sounds normal.  Musculoskeletal: Normal range of motion. He exhibits no edema.  Neurological: He is alert and oriented to person, place, and time.  Skin: Skin is warm and dry. Rash noted. There is erythema.  Decide on back with minimal expressible purulence considerable induration and  overlying skin slough  Vitals reviewed.     No results found for this or any previous visit (from the past 48 hour(s)). No results found.  Assessment/Plan:  This a patient with a large back abscess it was drained in the dermatologist's office yesterday and there is only minimal expressible purulence it is in resolution and he is on antibiotics. I recommended that he be seen back in the office on Monday if this were to refill or worsen it may need operative I&D.   Florene Glen, MD, FACS

## 2015-01-12 ENCOUNTER — Encounter: Payer: Self-pay | Admitting: *Deleted

## 2015-01-12 ENCOUNTER — Encounter: Payer: Self-pay | Admitting: General Surgery

## 2015-01-12 ENCOUNTER — Ambulatory Visit (INDEPENDENT_AMBULATORY_CARE_PROVIDER_SITE_OTHER): Payer: Medicare Other | Admitting: General Surgery

## 2015-01-12 VITALS — BP 131/67 | HR 61 | Temp 98.1°F | Ht 66.0 in | Wt 179.0 lb

## 2015-01-12 DIAGNOSIS — L02219 Cutaneous abscess of trunk, unspecified: Secondary | ICD-10-CM | POA: Diagnosis not present

## 2015-01-12 DIAGNOSIS — L03319 Cellulitis of trunk, unspecified: Secondary | ICD-10-CM

## 2015-01-12 MED ORDER — SULFAMETHOXAZOLE-TRIMETHOPRIM 800-160 MG PO TABS: 1.0000 | ORAL_TABLET | Freq: Two times a day (BID) | ORAL | Status: DC

## 2015-01-12 MED ORDER — HYDROCODONE-ACETAMINOPHEN 5-325 MG PO TABS: 1.0000 | ORAL_TABLET | Freq: Four times a day (QID) | ORAL | Status: DC | PRN

## 2015-01-12 NOTE — Patient Instructions (Signed)
We will see you on Wednesday. You are able to shower tomorrow. Please change your bandage tomorrow after you take a shower.  Give [place] a call if you have any questions or concerns.

## 2015-01-12 NOTE — Progress Notes (Signed)
Outpatient Surgical Follow Up  01/12/2015  Larry Lane is an 74 y.o. male.   Chief Complaint  Patient presents with  . Follow-up    HPI: 74 year old male returns to clinic today after being seen last week by one [place] [facility] for an abscess on his left upper back. Patient states that the area was not addressed at the prior visit only he was told to continue taking his antibiotics seek follow-up today. It has continued to drain a purulent drainage through the multiple small holes were made through a shave by a dermatologist. The infection itself started from trauma approximately 3 weeks prior to sit by piece of farming equipment which threw him to the ground. The area he states is smaller than prior to being seen by a dermatologist who was able to milk a fair portion of the fluid from the area during his prior clinic visit however it has persisted and continues to be red, raised, painful. He denies any current fevers, chills, nausea, vomiting, diarrhea, constipation. He states his sensation there is limited however thinks this might be related to his diabetes. He is otherwise without complaints today.  Past Medical History  Diagnosis Date  . Diabetes mellitus without complication ([place])   . GERD (gastroesophageal reflux disease)   . Trigeminal nerve disorder   . Bacterial infection 2015    Past Surgical History  Procedure Laterality Date  . Trigeminal nerve decompression    . Appendectomy  2010    Family History  Problem Relation Age of Onset  . Heart disease Father     Social History:  reports that he has never smoked. He has never used smokeless tobacco. He reports that he does not drink alcohol or use illicit drugs.  Allergies: No Known Allergies  Medications reviewed.    ROS  A multipoint review of systems was completed. All pertinent positives and negatives in the history of present illness the remainder were negative.  BP 131/67 mmHg  Pulse 61  Temp(Src)  98.1 F (36.7 C) (Oral)  Ht 5\' 6"  (1.676 m)  Wt 81.194 kg (179 lb)  BMI 28.91 kg/m2  Physical Exam  Gen.: No acute distress Chest: Clear to auscultation Heart: Regular rate and rhythm Abdomen: Soft, nontender, nondistended Skin: Large subcutaneous abscess of the left upper back measuring approximately 15 x 6 cm. Multiple punctate areas of drainage on the lateral aspect. Easily expressible purulence on exam.   No results found for this or any previous visit (from the past 48 hour(s)). No results found.  Assessment/Plan:  1. Cellulitis and abscess of trunk Discussed with the patient the need for further drainage of this. The procedure of an incision and drainage was discussed in detail to include the risks, benefits, alternatives. Patient voiced understanding wished proceed and formal signed consent was obtained in clinic.  Procedure in detail: The area was cleaned off with multiple iodine swabs and then sterilely draped. There was then localized with a 2-1 mixture of 1% lidocaine and Neut. The area was then localized and the effects of the local anesthetic was confirmed prior to incision. The opening with active drainage was incised with an 11 blade scalpel. This allowed an immediate return of copious amounts of purulent material. A culture was taken of this for speciation. Due to the amount of drainage this incision was made cruciate nature and all the cavities were probed with a cotton tip applicator. The purulent material was milked out from all directions and then copiously irrigated with  normal saline. Once the irrigant was returned clear the area was packed with quarter-inch iodoform gauze. A dressing of 4 x 4's and ABDs pad were placed over this.  Discussed with the patient and his wife that is okay to shower tomorrow gave patient supplies to change dressing one time. Plan for patient return to clinic on Wednesday for packing removal and additional wound check. Also given that this is  continued while on Augmentin will change to Bactrim. Prescription for Bactrim and Norco provided in clinic today.   [name]  01/12/2015,negative

## 2015-01-13 ENCOUNTER — Ambulatory Visit: Payer: Medicare Other | Admitting: Podiatry

## 2015-01-13 ENCOUNTER — Other Ambulatory Visit: Payer: Self-pay | Admitting: *Deleted

## 2015-01-14 ENCOUNTER — Encounter: Payer: Self-pay | Admitting: General Surgery

## 2015-01-14 ENCOUNTER — Ambulatory Visit (INDEPENDENT_AMBULATORY_CARE_PROVIDER_SITE_OTHER): Payer: Medicare Other | Admitting: General Surgery

## 2015-01-14 VITALS — BP 122/67 | HR 58 | Temp 98.0°F

## 2015-01-14 DIAGNOSIS — L03319 Cellulitis of trunk, unspecified: Secondary | ICD-10-CM

## 2015-01-14 DIAGNOSIS — L02219 Cutaneous abscess of trunk, unspecified: Secondary | ICD-10-CM | POA: Diagnosis not present

## 2015-01-14 NOTE — Progress Notes (Signed)
Outpatient Surgical Follow Up  01/14/2015  Larry Lane is an 74 y.o. male.   Chief Complaint  Patient presents with  . Wound Check    HPI:  74 year old male returns to clinic for an interval wound check from recently I indeed left shoulder abscess. Patient states that feels better but hasn't noticed any change in size. Patient and wife says she has a latex any better other than it might not be as thick. He denies any fevers, chills, nausea, vomiting, diarrhea, constipation. He's The area dressed and covered since he was seen last except for shower which patient's wife then to perform a dressing change for him.  Past Medical History  Diagnosis Date  . Diabetes mellitus without complication ([place])   . GERD (gastroesophageal reflux disease)   . Trigeminal nerve disorder   . Bacterial infection 2015    Past Surgical History  Procedure Laterality Date  . Trigeminal nerve decompression    . Appendectomy  2010    Family History  Problem Relation Age of Onset  . Heart disease Father     Social History:  reports that he has never smoked. He has never used smokeless tobacco. He reports that he does not drink alcohol or use illicit drugs.  Allergies: No Known Allergies  Medications reviewed.    ROS   multipoint review of systems was completed. All pertinent positives and negatives in history of present illness were negative.  BP 122/67 mmHg  Pulse 58  Temp(Src) 98 F (36.7 C) (Oral)  Physical Exam   Gen.: No acute distress neck line chest: Clear to auscultation Heart: Regular rhythm Abdomen: Soft, nontender, nondistended Skin: large abscess/hematoma remains present to the left upper shoulder. Previous drainage site with continued purulent output and packing removed with ease. Continues to be expressible purulence from the incision site. There is a 1 cm in diameter area of necrotic tissue adjacent to the I&D site from before.  No results found for this or any previous  visit (from the past 48 hour(s)). No results found.  culture result reviewed showing moderate growth of Staphylococcus aureus Assessment/Plan:  1. Cellulitis and abscess of trunk  discussed with patient and his wife that given the lack of improvement from the area that a larger incision and drainage is indicated. Patient voiced understanding and wished proceed and informed consent was obtained prior. Cultures growing Staphylococcus aureus to counseled patient to continue taking the Bactrim as it was prescribed glasses this will cover staph aureus.   procedure in detail: after obtaining informed consent the area was prepped with iodine and draped sterilely. A mixture of 0.5% Marcaine and 1% lidocaine were injected all around the abscess site and clinical anesthetic was confirmed with a pinch test prior to any incision. The necrotic tissue was then sharply debrided away with a 15 blade scalpel and the entire area of discoloration was opened up sharply with the aforementioned blade. Any pockets of abscess which there were multiple room and up bluntly with a Kelly forceps.  Hemostasis was assured with cautery and pressure. The entire wound was then copiously irrigated until her irrigation returned clear and there is no expressible purulence anymore. Wound was then packed with 4 x 4 gauze and a sterile dressing of 4 x 4's and ABDs pad was placed over this. Patient tolerated the procedure well.   discussed with the wife to leave the packing in place for 2 days and then to change the packing daily as shown in clinic. Patient to  continue taking the antibiotics provided his last visit and he will follow-up in clinic in 5 days for additional wound check.   [name]  01/14/2015,negative

## 2015-01-19 ENCOUNTER — Encounter: Payer: Self-pay | Admitting: Surgery

## 2015-01-19 ENCOUNTER — Ambulatory Visit (INDEPENDENT_AMBULATORY_CARE_PROVIDER_SITE_OTHER): Payer: Medicare Other | Admitting: Surgery

## 2015-01-19 VITALS — BP 146/79 | HR 63 | Temp 98.9°F | Ht 66.0 in | Wt 182.3 lb

## 2015-01-19 DIAGNOSIS — L03319 Cellulitis of trunk, unspecified: Secondary | ICD-10-CM

## 2015-01-19 DIAGNOSIS — L02219 Cutaneous abscess of trunk, unspecified: Secondary | ICD-10-CM

## 2015-01-19 NOTE — Progress Notes (Signed)
Outpatient postop visit  01/19/2015  Larry Lane is an 74 y.o. male.    Procedure: I&D and subsequent debridement of large back abscess  CC: Open wound  HPI: This a patient I've seen originally following an I&D performed by the dermatologist at that time there was no drainable abscess and only minimal if any expressible purulence. Patient came back to the office as suggested and Dr. Adonis Huguenin drained this abscess in a cruciate manner. Subsequent to that the skin edges died and he required debridement. These notes of been reviewed in the chart. Patient's wife is doing dressings once a day   Medications reviewed.    Physical Exam:  BP 146/79 mmHg  Pulse 63  Temp(Src) 98.9 F (37.2 C) (Oral)  Ht 5\' 6"  (1.676 m)  Wt 182 lb 4.8 oz (82.691 kg)  BMI 29.44 kg/m2    PE: Open wound with visible muscle granulation tissue at the edges some fibrillar no purulent material present as well. No erythema and no purulence    Assessment/Plan:  Wound edges treated with silver nitrate after sharp debridement of a minimal nature of fibrillar no purulent material on the skin edges. Recommend continue normal saline wet-to-dry once a day and I will see them in the office in 2 days and reassess this wound.  Florene Glen, MD, FACS

## 2015-01-19 NOTE — Patient Instructions (Signed)
Wet packing guaze with saline then place in opening to wound, place dry guaze over the wound and then ABD pad.   Before taking a shower, take everything off and take packing out. Dry thoroughly after shower and replace packing and dressing.  We will see you in [place] on 11/23.

## 2015-01-20 ENCOUNTER — Encounter: Payer: Self-pay | Admitting: *Deleted

## 2015-01-21 ENCOUNTER — Encounter: Payer: Self-pay | Admitting: Surgery

## 2015-01-21 ENCOUNTER — Ambulatory Visit (INDEPENDENT_AMBULATORY_CARE_PROVIDER_SITE_OTHER): Payer: Medicare Other | Admitting: Surgery

## 2015-01-21 VITALS — BP 155/71 | HR 57 | Temp 98.9°F | Ht 66.0 in | Wt 182.4 lb

## 2015-01-21 DIAGNOSIS — L02219 Cutaneous abscess of trunk, unspecified: Secondary | ICD-10-CM

## 2015-01-21 DIAGNOSIS — L03319 Cellulitis of trunk, unspecified: Secondary | ICD-10-CM

## 2015-01-21 NOTE — Patient Instructions (Signed)
Increase wet to dry dressing changes to three times daily until seen in office next week.

## 2015-01-21 NOTE — Progress Notes (Signed)
Outpatient postop visit  01/21/2015  Larry Lane is an 74 y.o. male.    Procedure: I&D and debridement of back abscess  CC: Open wound  HPI: This patient sought 2 days ago with an open wound which was debrided and treated with silver nitrate to a small degree.  Medications reviewed.    Physical Exam:  There were no vitals taken for this visit.    PE: Better granulation tissue, no erythema no purulence minimal fibber no purulent exudate present.    Assessment/Plan:  Patient has benefited from increased frequency wet to dry dressings patient is instructed about twice a day or 3 times a day dressings and follow-up next week. Minimal silver nitrate was required today.  Florene Glen, MD, FACS

## 2015-01-30 ENCOUNTER — Ambulatory Visit (INDEPENDENT_AMBULATORY_CARE_PROVIDER_SITE_OTHER): Payer: Medicare Other | Admitting: Surgery

## 2015-01-30 ENCOUNTER — Encounter: Payer: Self-pay | Admitting: Surgery

## 2015-01-30 VITALS — BP 143/71 | HR 59 | Temp 98.4°F | Ht 66.0 in | Wt 186.0 lb

## 2015-01-30 DIAGNOSIS — L02219 Cutaneous abscess of trunk, unspecified: Secondary | ICD-10-CM

## 2015-01-30 DIAGNOSIS — L03319 Cellulitis of trunk, unspecified: Secondary | ICD-10-CM

## 2015-01-30 NOTE — Patient Instructions (Signed)
Please give [place] a call if you have any questions or concerns.  We will see you in four weeks to follow up on your wound.

## 2015-01-30 NOTE — Progress Notes (Signed)
74 yr old male with large left back abscess.  Patient states doing well, says soreness improved.  Changing dressing twice daily.  He denies any fever, chills or hypergylcemia   Filed Vitals:   01/30/15 0935  BP: 143/71  Pulse: 59  Temp: 98.4 F (36.9 C)   PE;  Gen: NAD, AAOx3 Back: abscess cavity 4cmx3cm x 11mm, overall improved with good granulation tissue, only minimal edge of fibrinous material to superior aspect, some hyperemic skin but no erythema, no fluctuance or induration  A/P: Abscess starting to heal now, continue dressing changes twice daily, come back in 3-4week for wound check.

## 2015-03-04 ENCOUNTER — Encounter: Payer: Self-pay | Admitting: Surgery

## 2015-03-04 ENCOUNTER — Ambulatory Visit (INDEPENDENT_AMBULATORY_CARE_PROVIDER_SITE_OTHER): Payer: Medicare Other | Admitting: Surgery

## 2015-03-04 VITALS — BP 166/80 | HR 60 | Temp 98.7°F | Ht 66.0 in | Wt 189.0 lb

## 2015-03-04 DIAGNOSIS — L03319 Cellulitis of trunk, unspecified: Secondary | ICD-10-CM

## 2015-03-04 DIAGNOSIS — L02219 Cutaneous abscess of trunk, unspecified: Secondary | ICD-10-CM

## 2015-03-04 NOTE — Patient Instructions (Addendum)
Just apply a dry gauze on your wound twice a day. Please don't apply any lotion on your wound.

## 2015-03-04 NOTE — Progress Notes (Signed)
75 yr old male s/p left back abscess I&D.  It has been healing in and he has been doing wet to dry dressing.  He denies any fever, chills, pain or drainage in the area.   Filed Vitals:   03/04/15 0954  BP: 166/80  Pulse: 60  Temp: 98.7 F (37.1 C)   PE;  Gen: NAD Wound: left back 3cm x 3cm in size with good granulation tissue approximately 84mm deep, hyperemic changes immediately surrounding, but no erythema or purulence  A/P: Left back abscess, healing well, can stop wet to dry dressings and use triple antibiotic ointment and dry gauze.  Will likely complete the healing process in 3-4 more weeks.  He is to call if there are any issues or if a new area develops.

## 2015-03-25 ENCOUNTER — Telehealth: Payer: Self-pay | Admitting: *Deleted

## 2015-03-25 NOTE — Telephone Encounter (Signed)
Fax request for refill Meloxicam.  Refill denied pt was to follow up with Dr. Jacqualyn Posey.  Returned fax denied.

## 2015-05-28 ENCOUNTER — Ambulatory Visit: Payer: Self-pay | Admitting: Internal Medicine

## 2015-06-15 ENCOUNTER — Other Ambulatory Visit: Payer: Self-pay

## 2015-06-16 ENCOUNTER — Encounter: Payer: Self-pay | Admitting: General Surgery

## 2015-06-16 ENCOUNTER — Ambulatory Visit (INDEPENDENT_AMBULATORY_CARE_PROVIDER_SITE_OTHER): Payer: Medicare Other | Admitting: General Surgery

## 2015-06-16 VITALS — BP 178/80 | HR 75 | Temp 98.7°F | Ht 66.0 in | Wt 185.2 lb

## 2015-06-16 DIAGNOSIS — L02219 Cutaneous abscess of trunk, unspecified: Secondary | ICD-10-CM | POA: Diagnosis not present

## 2015-06-16 DIAGNOSIS — L03319 Cellulitis of trunk, unspecified: Secondary | ICD-10-CM

## 2015-06-16 NOTE — Progress Notes (Signed)
Outpatient Surgical Follow Up  06/16/2015  Larry Lane is an 75 y.o. male.   Chief Complaint  Patient presents with  . Abscess    Back    HPI: 75 year old male returns to clinic for repeat examination of his left back. He had a complicated infected hematoma to the area proximally 6 months ago that required multiple I&D's and a prolonged antibiotic therapy. He states that a few weeks ago the area became red again inserted itching. He thinks it might of drain and a little bit however has healed since. Currently has no complaints and states that the area resolved after he may be appointment but he Isn't specific to be looked at again. He denies any fevers, chills, nausea, vomiting, diarrhea, constipation, chest pain, shortness of breath.  Past Medical History  Diagnosis Date  . Diabetes mellitus without complication ([place])   . GERD (gastroesophageal reflux disease)   . Trigeminal nerve disorder   . Bacterial infection 2015  . Abscess of back     Past Surgical History  Procedure Laterality Date  . Trigeminal nerve decompression    . Appendectomy  2010  . I&d of back abscess      Family History  Problem Relation Age of Onset  . Heart disease Father     Social History:  reports that he has never smoked. He has never used smokeless tobacco. He reports that he does not drink alcohol or use illicit drugs.  Allergies: No Known Allergies  Medications reviewed.    ROS  A multipoint review of systems was completed. All pertinent positives and negatives are documented in the history of present illness and remainder are negative.  BP 178/80 mmHg  Pulse 75  Temp(Src) 98.7 F (37.1 C) (Oral)  Ht 5\' 6"  (1.676 m)  Wt 84.006 kg (185 lb 3.2 oz)  BMI 29.91 kg/m2  Physical Exam  Gen.: No acute distress Chest: Clear to auscultation Heart: Regular rate and rhythm Abdomen: Soft and nontender Skin: Previous infected hematoma site investigated. Skin is intact without any evidence  of drainage. There is some blanching hyperemia to the central aspect of the prior hematoma but no evidence of purulence.   No results found for this or any previous visit (from the past 48 hour(s)). No results found.  Assessment/Plan:  1. Cellulitis and abscess of trunk Patient with previous cellulitis and abscess of the left upper back. Does not appear to be currently infected however giving his history and his insulin-dependent diabetes it is a possibility. We will provide him with a paper prescription for antibiotics today in case the area worsens and instructed him to call the clinic immediately should that occur. He voiced understanding and follow-up in clinic on an as-needed basis.     [name], MD FACS General Surgeon  06/16/2015,3:12 PM

## 2015-06-16 NOTE — Patient Instructions (Signed)
You have been given a prescription for Bactrim DS today. If you feel that this area is getting inflamed, more itchy, or having drainage from site; fill the prescription and call our office immediately so that we can work you in. You may ask for Larry Lane if there is a problem with scheduling within 24-48 hours from the time you call.

## 2015-12-21 IMAGING — CT CT ABD-PELV W/ CM
2 of 5 series · 16 of 46 positions shown, 18 images · IV contrast (isovue)
Comparison: Included view of the lung bases demonstrate dependent
atelectasis.

CLINICAL DATA: Lower abdominal pain for 1 day, nausea and vomiting.
Leukocytosis.

EXAM:
CT ABDOMEN AND PELVIS WITH CONTRAST
TECHNIQUE: Multidetector CT imaging of the abdomen and pelvis was performed
using the standard protocol following bolus administration of
intravenous contrast.
CONTRAST:  100 cc of Isovue 370

[Series 2: routine abd pel with · axial · 0.78mm/px · z∈[-692,-248]mm · 13 of 99 slices shown, 15 images]
[im 5/99  soft-tissue]
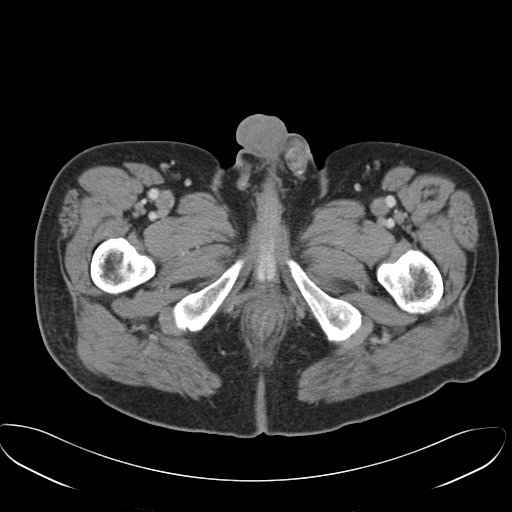
[im 5/99  bone]
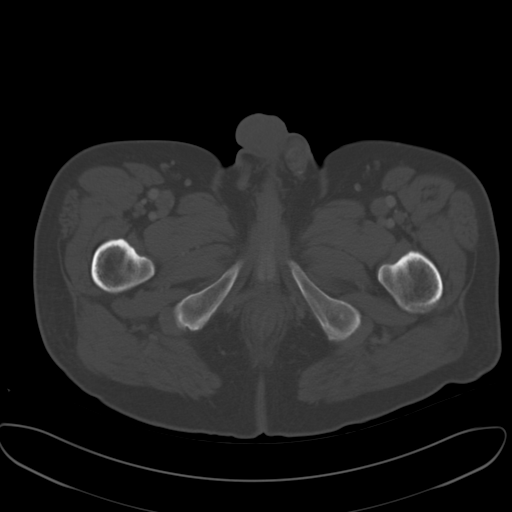
[im 15/99  soft-tissue]
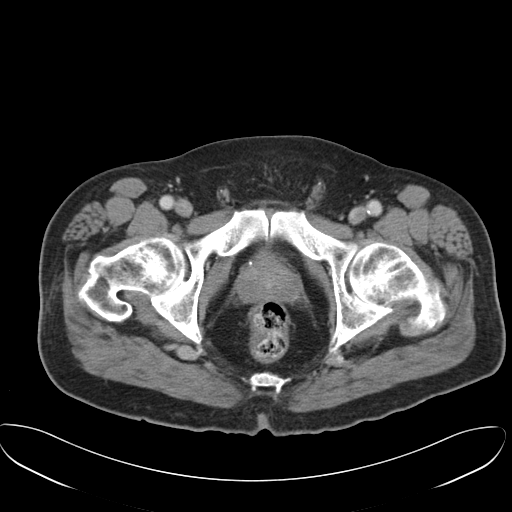
[im 20/99  soft-tissue]
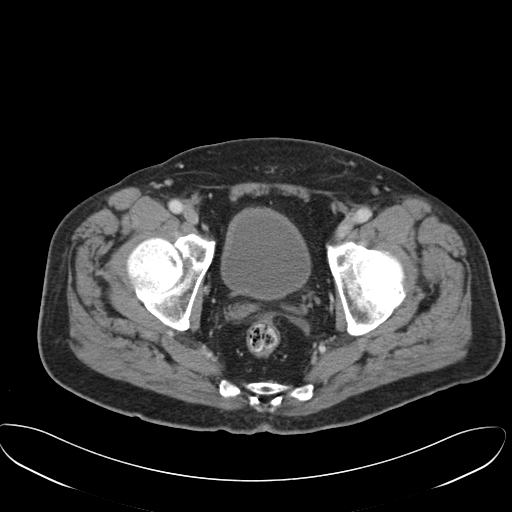
[im 30/99  soft-tissue]
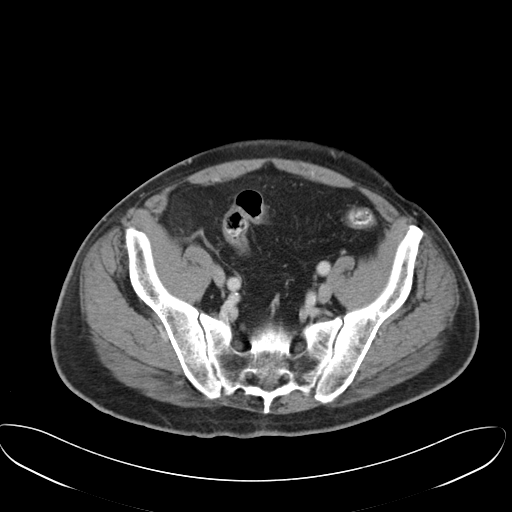
[im 35/99  soft-tissue]
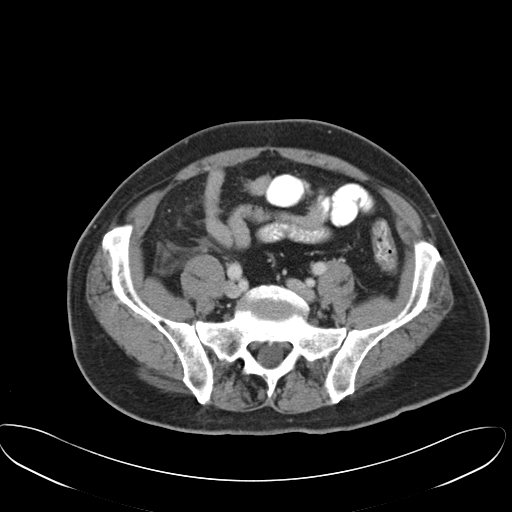
[im 45/99  soft-tissue]
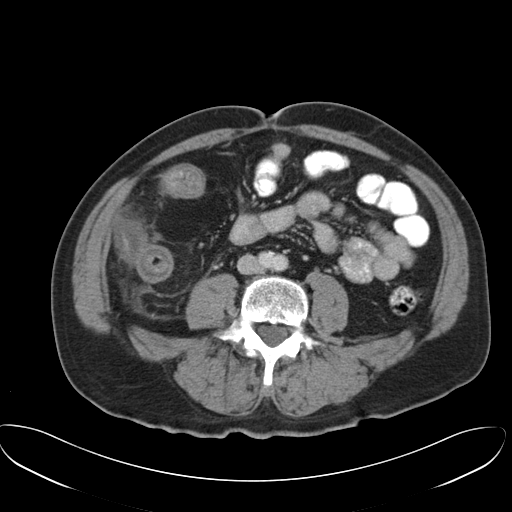
[im 50/99  soft-tissue]
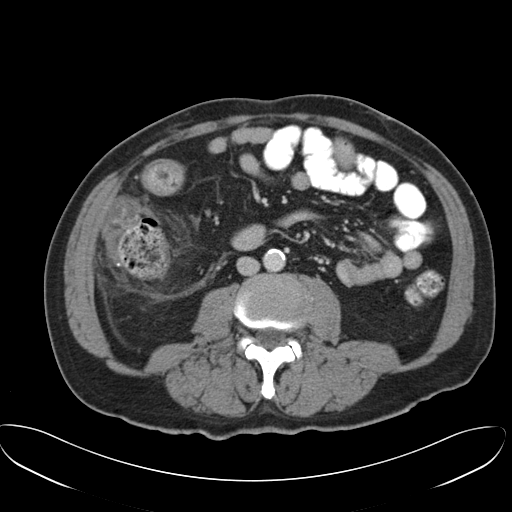
[im 54/99  soft-tissue]
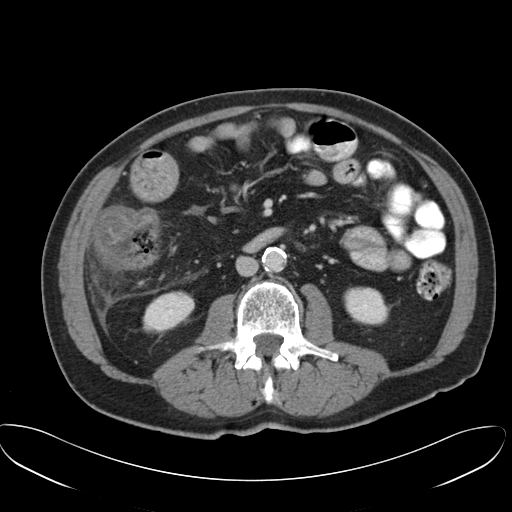
[im 64/99  soft-tissue]
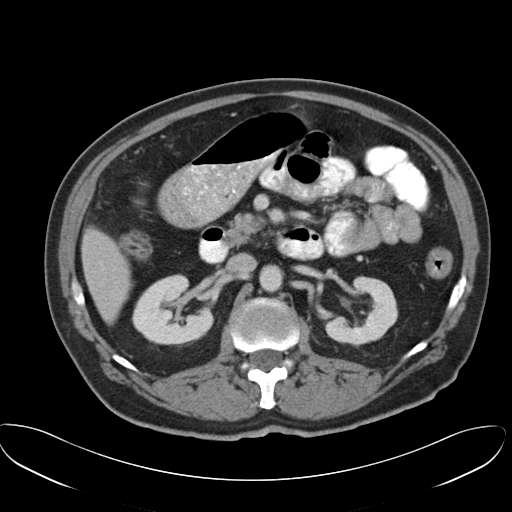
[im 64/99  bone]
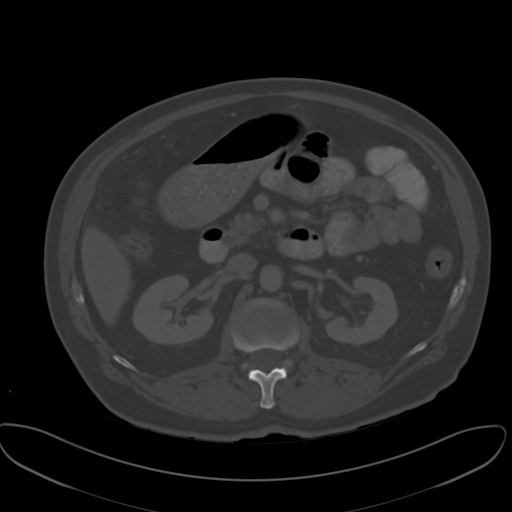
[im 69/99  soft-tissue]
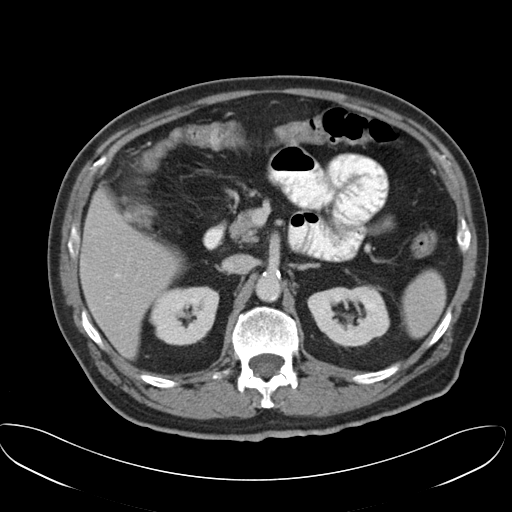
[im 79/99  soft-tissue]
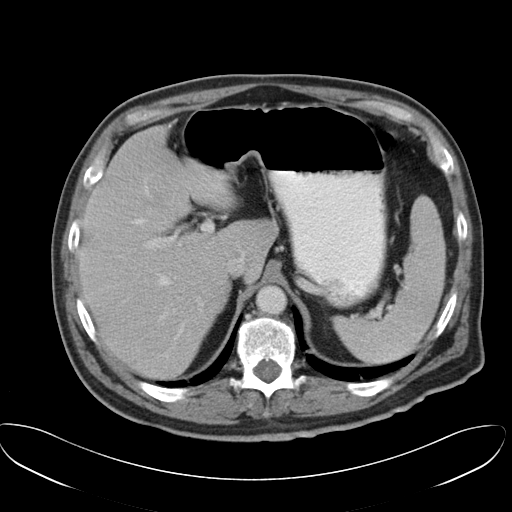
[im 84/99  soft-tissue]
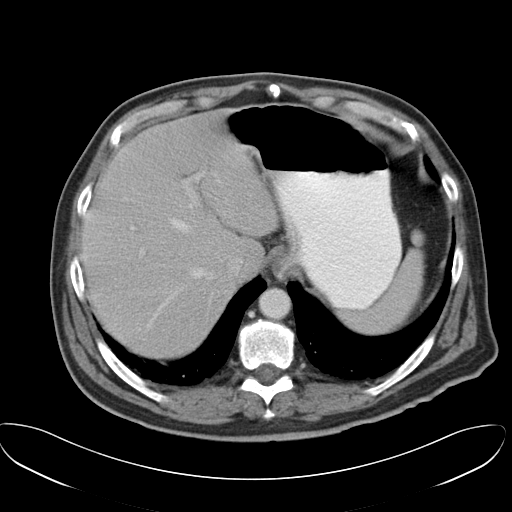
[im 94/99  soft-tissue]
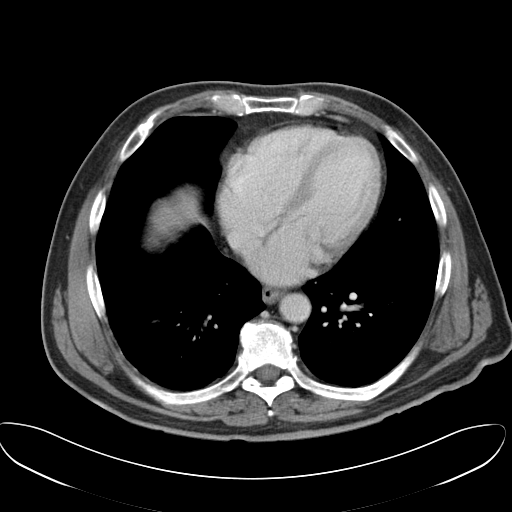

[Series 5: cor routine abd pel with · coronal · 0.81mm/px · 3 of 136 slices shown]
[im 46/136  soft-tissue]
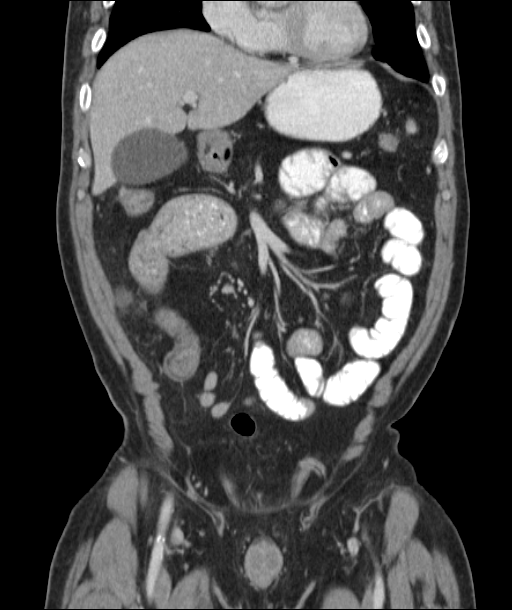
[im 61/136  soft-tissue]
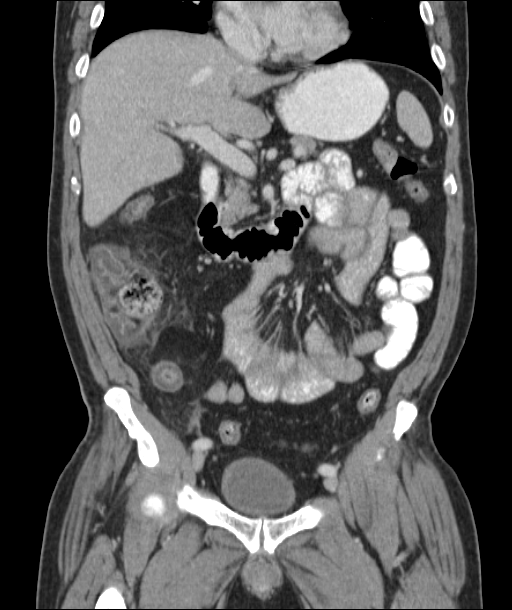
[im 76/136  soft-tissue]
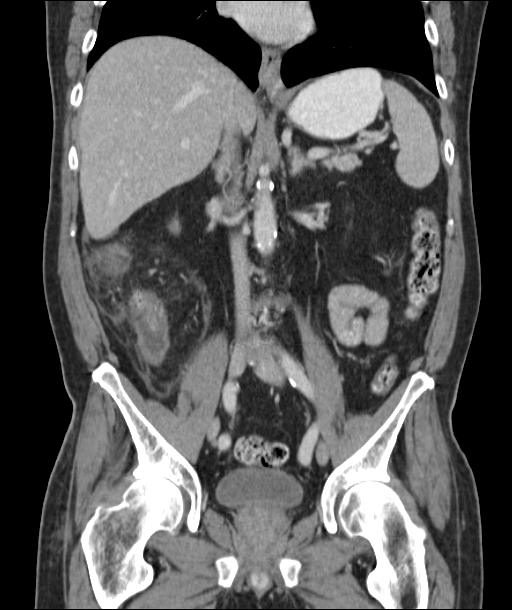

[16 of 46 positions shown; findings below may reference images not displayed]

. Visualized heart and pericardium are unremarkable.

The liver, spleen, gallbladder, pancreas and adrenal glands are
unremarkable.

Inflammatory changes of the terminal ileum, with fecalized small
bowel contents, distal ileum bowel loop measures up to 4.7 cm in
transaxial dimension. Extensive cecal and ascending colonic wall
edema with surrounding inflammatory changes. Status post
appendectomy. No pneumatosis or bowel perforation. Trace free fluid
in the right pericolic gutter without abscess or pneumoperitoneum.

The stomach, and proximal small bowel are normal in course and
caliber without inflammatory changes.

Kidneys are orthotopic, demonstrating symmetric enhancement ; 2 mm
nonobstructing left interpolar renal calculus. Too small to
characterize hypodensity in the right kidney. Partially distended
and unremarkable.

Aortoiliac vessels are normal in course and caliber with moderate
calcific atherosclerosis. . No lymphadenopathy by CT size criteria.
Internal reproductive organs are unremarkable. The soft tissues and
included osseous structures are nonsuspicious. Mild degenerative
change of the hips. Mild degenerative changes lumbar spine.
FINDINGS: Terminal ileal and cecal/ascending colonic wall edema and
inflammatory changes most consistent with enterocolitis, this could
be infectious or, less likely inflammatory in nature. No bowel
perforation or abscess. Status post appendectomy. Dilated distal
ileum with small bowel feces suggesting chronic stasis without frank
bowel obstruction.

  By: Natax Villah

## 2015-12-22 IMAGING — CR DG CHEST 2V
1 series · 2 of 2 positions shown · non-contrast
Comparison: CT ABD-PELV W/ CM dated 05/21/2013

CLINICAL DATA: INCREASED TEMPERATURE

EXAM:
CHEST  2 VIEW

[Series 1: w chest pa · 0.14mm/px · 2 of 2 slices shown]
[im 1/2]
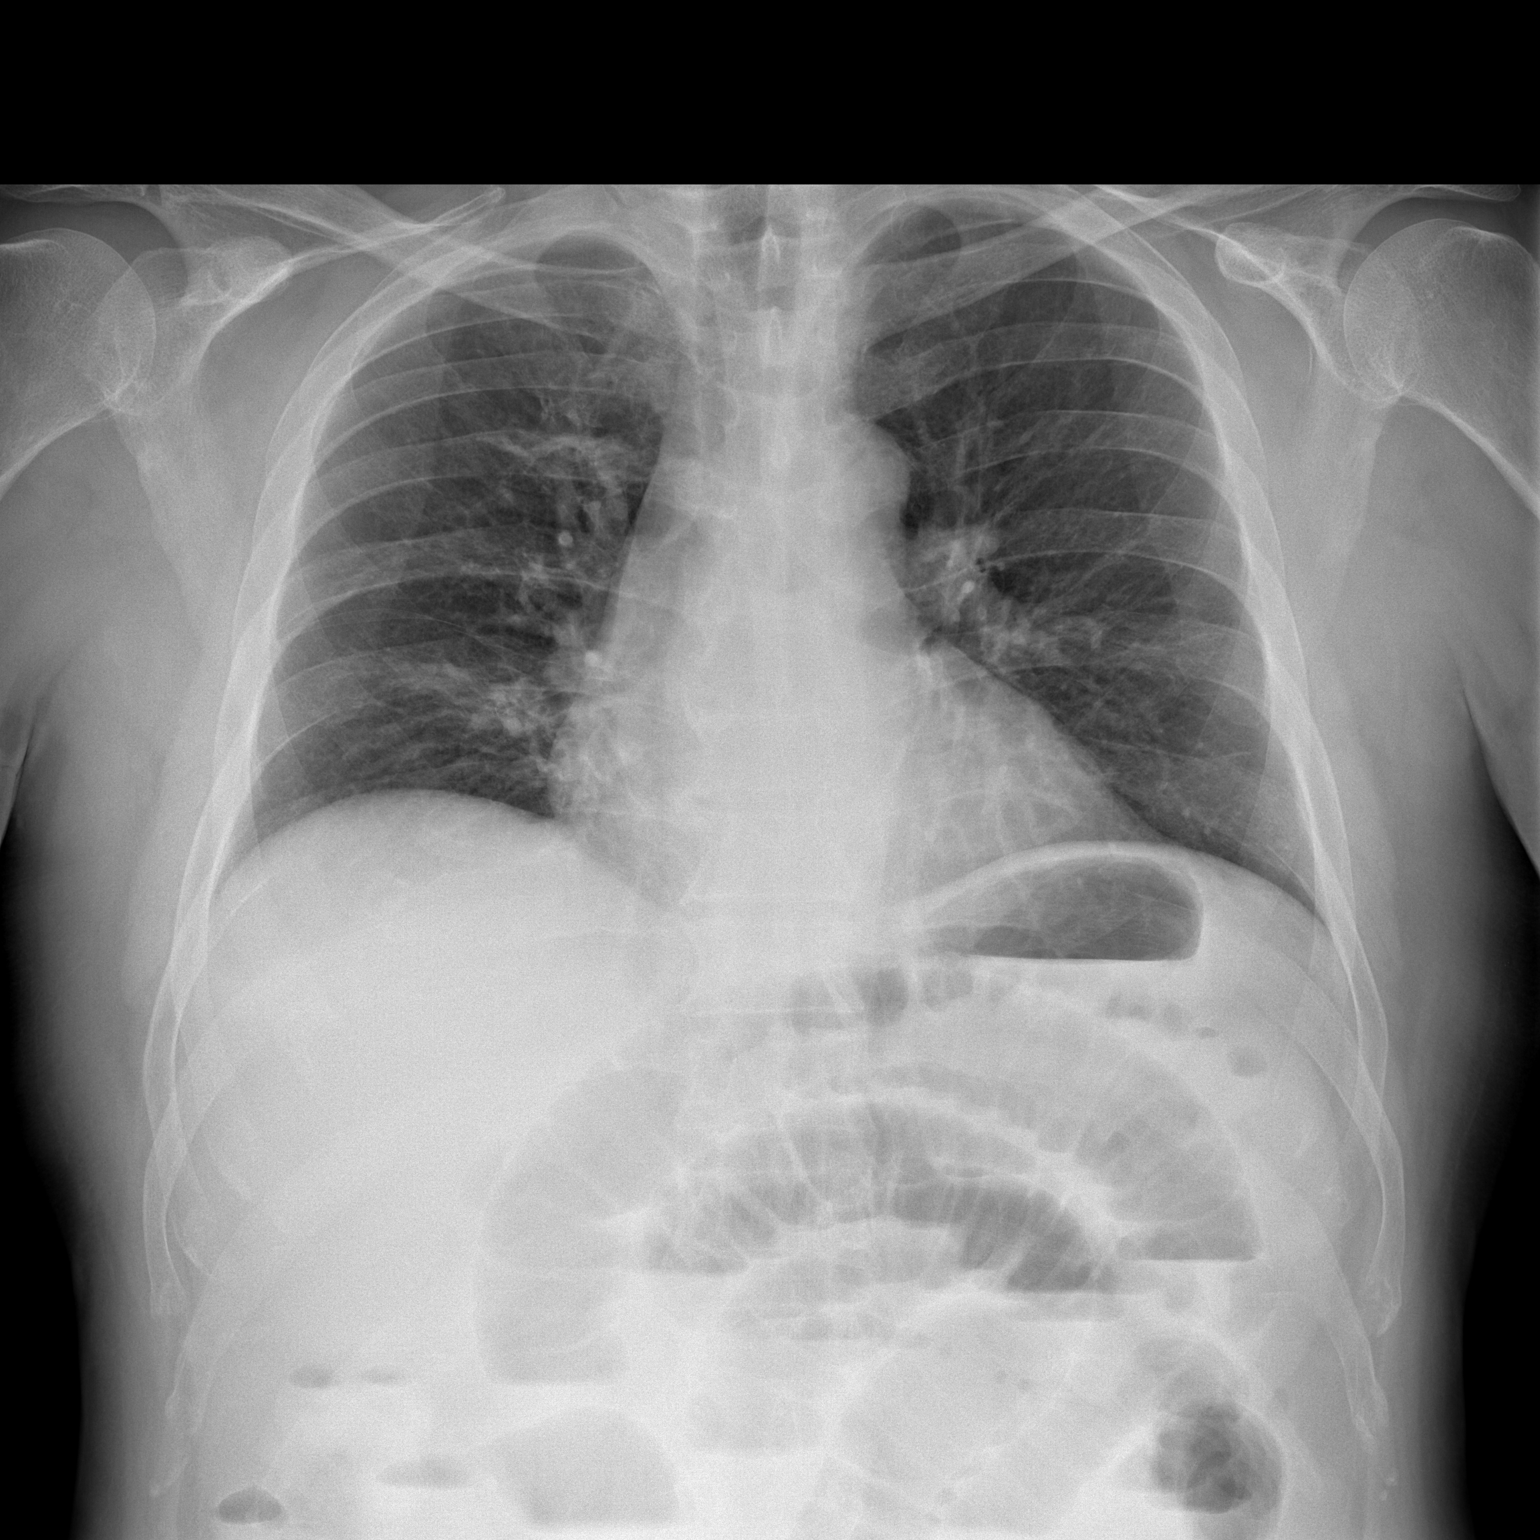
[im 2/2]
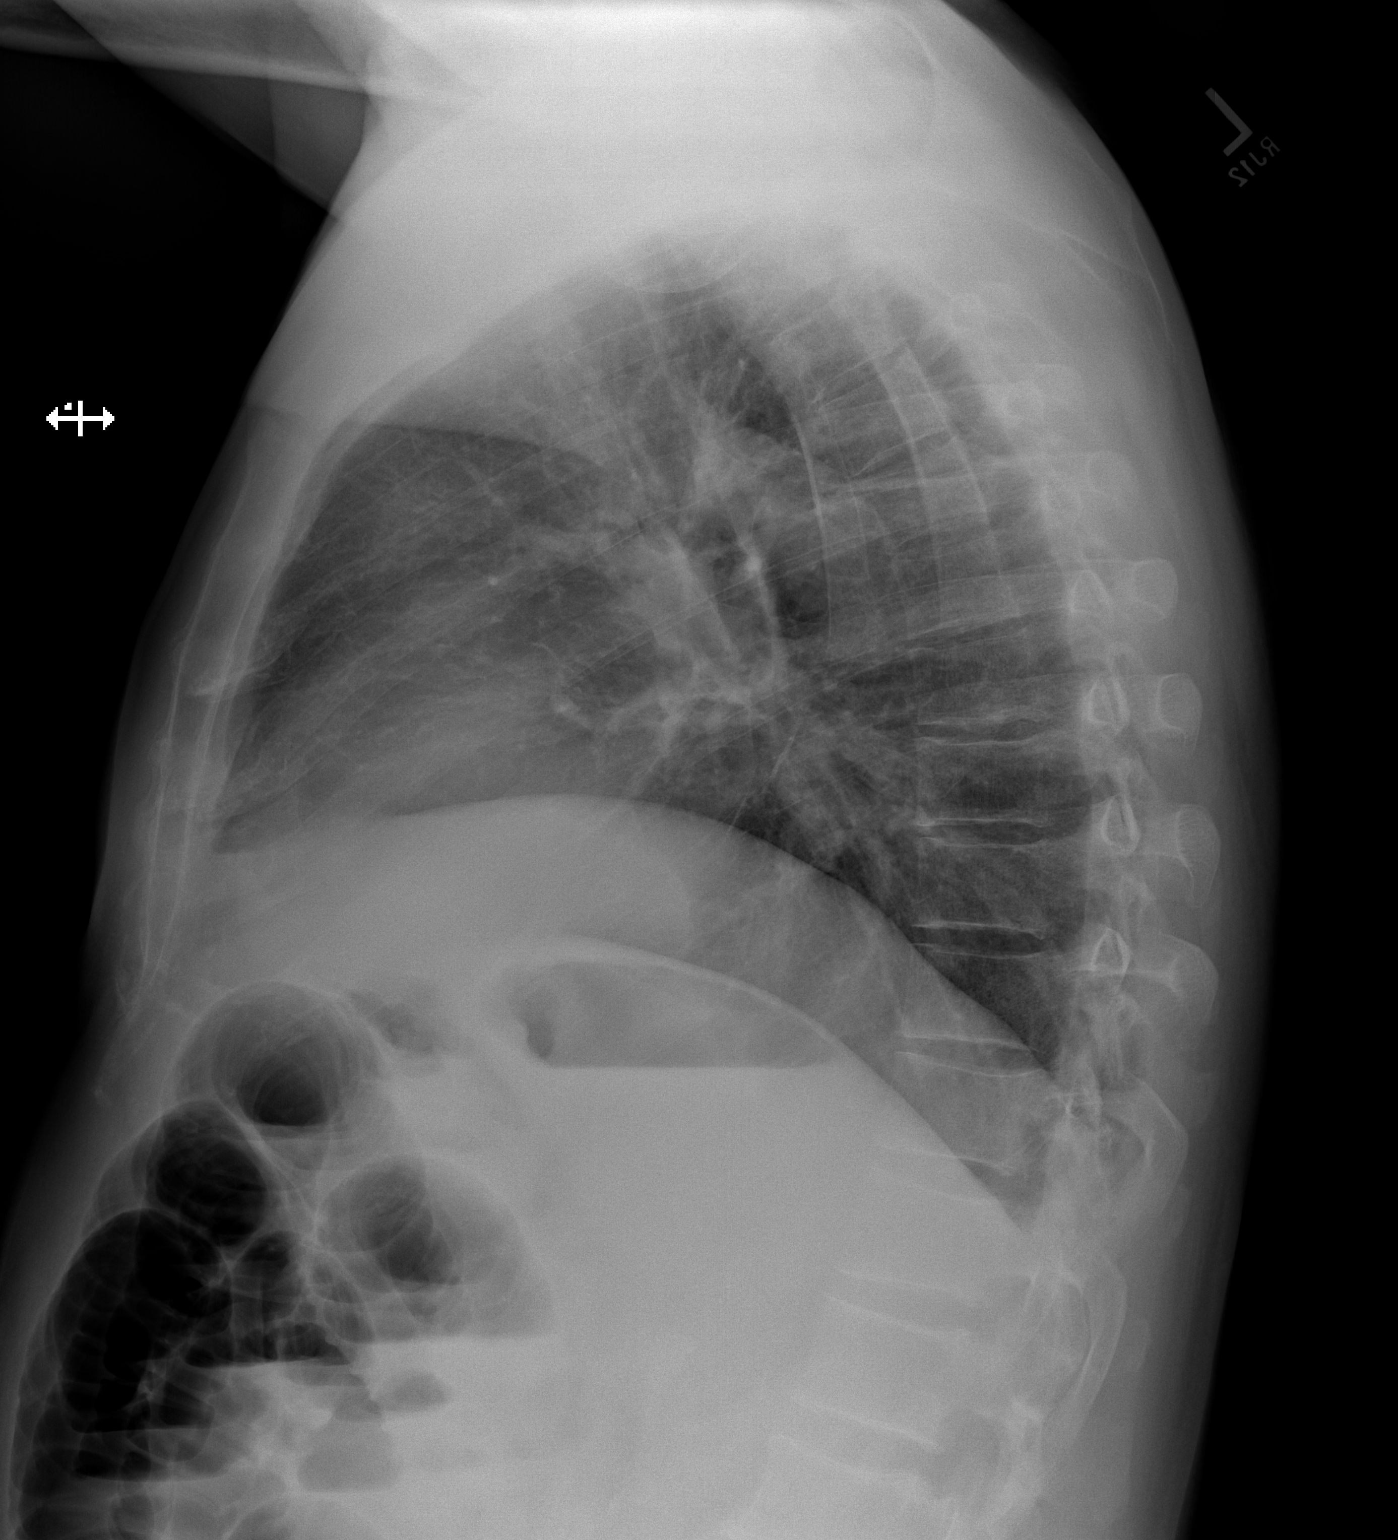

[2 of 2 positions shown; findings below may reference images not displayed]

FINDINGS: There are dilated loops of small bowel with air-fluid levels. No
free air beneath hemidiaphragms. Lungs are clear. No effusion
infiltrate or pneumothorax.
IMPRESSION: 1. Bowel gas pattern consistent small bowel obstruction.
2. Lungs are clear.

## 2016-03-11 ENCOUNTER — Other Ambulatory Visit: Payer: Self-pay | Admitting: Neurology

## 2016-03-11 DIAGNOSIS — R404 Transient alteration of awareness: Secondary | ICD-10-CM

## 2016-03-23 ENCOUNTER — Ambulatory Visit
Admission: RE | Admit: 2016-03-23 | Discharge: 2016-03-23 | Disposition: A | Discharge status: Home / Self Care | Payer: Medicare Other | Source: Ambulatory Visit | Attending: Neurology | Admitting: Neurology

## 2016-03-23 DIAGNOSIS — Z9889 Other specified postprocedural states: Secondary | ICD-10-CM | POA: Diagnosis not present

## 2016-03-23 DIAGNOSIS — R404 Transient alteration of awareness: Secondary | ICD-10-CM | POA: Diagnosis not present

## 2016-03-23 DIAGNOSIS — I6782 Cerebral ischemia: Secondary | ICD-10-CM | POA: Diagnosis not present

## 2016-03-23 LAB — POCT I-STAT CREATININE: CREATININE: 1.1 mg/dL (ref 0.61–1.24)

## 2016-03-23 MED ORDER — GADOBENATE DIMEGLUMINE 529 MG/ML IV SOLN
17.0000 mL | Freq: Once | INTRAVENOUS | Status: AC | PRN
  Administered 2016-03-23: 17 mL via INTRAVENOUS

## 2017-01-17 ENCOUNTER — Encounter: Payer: Self-pay | Admitting: *Deleted

## 2017-01-26 ENCOUNTER — Ambulatory Visit: Payer: Medicare Other | Admitting: Registered Nurse

## 2017-01-26 ENCOUNTER — Encounter: Payer: Self-pay | Admitting: *Deleted

## 2017-01-26 ENCOUNTER — Ambulatory Visit
Admission: RE | Admit: 2017-01-26 | Discharge: 2017-01-26 | Disposition: A | Discharge status: Home / Self Care | Payer: Medicare Other | Source: Ambulatory Visit | Attending: Ophthalmology | Admitting: Ophthalmology

## 2017-01-26 ENCOUNTER — Encounter
Admission: RE | Disposition: A | Discharge status: Home / Self Care | Payer: Self-pay | Source: Ambulatory Visit | Attending: Ophthalmology

## 2017-01-26 ENCOUNTER — Other Ambulatory Visit: Payer: Self-pay

## 2017-01-26 DIAGNOSIS — H2511 Age-related nuclear cataract, right eye: Secondary | ICD-10-CM | POA: Insufficient documentation

## 2017-01-26 DIAGNOSIS — Z794 Long term (current) use of insulin: Secondary | ICD-10-CM | POA: Insufficient documentation

## 2017-01-26 DIAGNOSIS — G500 Trigeminal neuralgia: Secondary | ICD-10-CM | POA: Diagnosis not present

## 2017-01-26 DIAGNOSIS — Z85828 Personal history of other malignant neoplasm of skin: Secondary | ICD-10-CM | POA: Insufficient documentation

## 2017-01-26 DIAGNOSIS — I10 Essential (primary) hypertension: Secondary | ICD-10-CM | POA: Diagnosis not present

## 2017-01-26 DIAGNOSIS — Z79899 Other long term (current) drug therapy: Secondary | ICD-10-CM | POA: Insufficient documentation

## 2017-01-26 DIAGNOSIS — K219 Gastro-esophageal reflux disease without esophagitis: Secondary | ICD-10-CM | POA: Insufficient documentation

## 2017-01-26 DIAGNOSIS — Z7982 Long term (current) use of aspirin: Secondary | ICD-10-CM | POA: Insufficient documentation

## 2017-01-26 DIAGNOSIS — E119 Type 2 diabetes mellitus without complications: Secondary | ICD-10-CM | POA: Diagnosis not present

## 2017-01-26 DIAGNOSIS — M1990 Unspecified osteoarthritis, unspecified site: Secondary | ICD-10-CM | POA: Diagnosis not present

## 2017-01-26 HISTORY — DX: Edema, unspecified: R60.9

## 2017-01-26 HISTORY — DX: Unspecified osteoarthritis, unspecified site: M19.90

## 2017-01-26 HISTORY — DX: Malignant (primary) neoplasm, unspecified: C80.1

## 2017-01-26 HISTORY — DX: Essential (primary) hypertension: I10

## 2017-01-26 HISTORY — DX: Cardiac arrhythmia, unspecified: I49.9

## 2017-01-26 HISTORY — PX: CATARACT EXTRACTION W/PHACO: SHX586

## 2017-01-26 HISTORY — DX: Trigeminal neuralgia: G50.0

## 2017-01-26 LAB — GLUCOSE, CAPILLARY: GLUCOSE-CAPILLARY: 184 mg/dL — AB (ref 65–99)

## 2017-01-26 SURGERY — PHACOEMULSIFICATION, CATARACT, WITH IOL INSERTION
Anesthesia: Monitor Anesthesia Care | Site: Eye | Laterality: Right | Wound class: Clean

## 2017-01-26 MED ORDER — BSS IO SOLN
INTRAOCULAR | Status: DC | PRN
  Administered 2017-01-26: 1 via INTRAOCULAR

## 2017-01-26 MED ORDER — FENTANYL CITRATE (PF) 100 MCG/2ML IJ SOLN
INTRAMUSCULAR | Status: AC
  Filled 2017-01-26: qty 2

## 2017-01-26 MED ORDER — POVIDONE-IODINE 5 % OP SOLN
OPHTHALMIC | Status: AC
  Filled 2017-01-26: qty 30

## 2017-01-26 MED ORDER — SODIUM CHLORIDE 0.9 % IV SOLN
INTRAVENOUS | Status: DC
  Administered 2017-01-26: 06:00:00 via INTRAVENOUS

## 2017-01-26 MED ORDER — MOXIFLOXACIN HCL 0.5 % OP SOLN
OPHTHALMIC | Status: AC
  Filled 2017-01-26: qty 3

## 2017-01-26 MED ORDER — MIDAZOLAM HCL 2 MG/2ML IJ SOLN
INTRAMUSCULAR | Status: DC | PRN
  Administered 2017-01-26 (×2): 1 mg via INTRAVENOUS

## 2017-01-26 MED ORDER — SODIUM HYALURONATE 23 MG/ML IO SOLN
INTRAOCULAR | Status: AC
  Filled 2017-01-26: qty 0.6

## 2017-01-26 MED ORDER — POVIDONE-IODINE 5 % OP SOLN
OPHTHALMIC | Status: DC | PRN
  Administered 2017-01-26: 1 via OPHTHALMIC

## 2017-01-26 MED ORDER — ARMC OPHTHALMIC DILATING DROPS
1.0000 "application " | OPHTHALMIC | Status: AC
  Administered 2017-01-26 (×3): 1 via OPHTHALMIC

## 2017-01-26 MED ORDER — MIDAZOLAM HCL 2 MG/2ML IJ SOLN
INTRAMUSCULAR | Status: AC
  Filled 2017-01-26: qty 2

## 2017-01-26 MED ORDER — LIDOCAINE HCL (PF) 4 % IJ SOLN
INTRAMUSCULAR | Status: AC
  Filled 2017-01-26: qty 5

## 2017-01-26 MED ORDER — SODIUM HYALURONATE 10 MG/ML IO SOLN
INTRAOCULAR | Status: DC | PRN
  Administered 2017-01-26: 0.55 mL via INTRAOCULAR

## 2017-01-26 MED ORDER — PROPOFOL 10 MG/ML IV BOLUS
INTRAVENOUS | Status: AC
  Filled 2017-01-26: qty 20

## 2017-01-26 MED ORDER — FENTANYL CITRATE (PF) 100 MCG/2ML IJ SOLN
INTRAMUSCULAR | Status: DC | PRN
  Administered 2017-01-26: 50 ug via INTRAVENOUS

## 2017-01-26 MED ORDER — MOXIFLOXACIN HCL 0.5 % OP SOLN
OPHTHALMIC | Status: DC | PRN
  Administered 2017-01-26: 0.2 mL via OPHTHALMIC

## 2017-01-26 MED ORDER — SODIUM HYALURONATE 23 MG/ML IO SOLN
INTRAOCULAR | Status: DC | PRN
  Administered 2017-01-26: 0.6 mL via INTRAOCULAR

## 2017-01-26 MED ORDER — ARMC OPHTHALMIC DILATING DROPS
OPHTHALMIC | Status: AC
  Filled 2017-01-26: qty 0.4

## 2017-01-26 MED ORDER — EPINEPHRINE PF 1 MG/ML IJ SOLN
INTRAMUSCULAR | Status: AC
  Filled 2017-01-26: qty 1

## 2017-01-26 MED ORDER — MOXIFLOXACIN HCL 0.5 % OP SOLN: 1.0000 [drp] | OPHTHALMIC | Status: DC | PRN

## 2017-01-26 MED ORDER — NA CHONDROIT SULF-NA HYALURON 40-17 MG/ML IO SOLN
INTRAOCULAR | Status: AC
  Filled 2017-01-26: qty 1

## 2017-01-26 MED ORDER — LIDOCAINE HCL (PF) 4 % IJ SOLN
INTRAOCULAR | Status: DC | PRN
  Administered 2017-01-26: 4 mL via OPHTHALMIC

## 2017-01-26 SURGICAL SUPPLY — 16 items
DISSECTOR HYDRO NUCLEUS 50X22 (MISCELLANEOUS) ×2 IMPLANT
GLOVE BIO SURGEON STRL SZ8 (GLOVE) ×2 IMPLANT
GLOVE BIOGEL M 6.5 STRL (GLOVE) ×2 IMPLANT
GLOVE SURG LX 7.5 STRW (GLOVE) ×1
GLOVE SURG LX STRL 7.5 STRW (GLOVE) ×1 IMPLANT
GOWN STRL REUS W/ TWL LRG LVL3 (GOWN DISPOSABLE) ×2 IMPLANT
GOWN STRL REUS W/TWL LRG LVL3 (GOWN DISPOSABLE) ×2
LABEL CATARACT MEDS ST (LABEL) ×2 IMPLANT
LENS IOL TECNIS ITEC 18.5 (Intraocular Lens) ×2 IMPLANT
PACK CATARACT (MISCELLANEOUS) ×2 IMPLANT
PACK CATARACT KING (MISCELLANEOUS) ×2 IMPLANT
PACK EYE AFTER SURG (MISCELLANEOUS) ×2 IMPLANT
SOL BSS BAG (MISCELLANEOUS) ×2
SOLUTION BSS BAG (MISCELLANEOUS) ×1 IMPLANT
WATER STERILE IRR 250ML POUR (IV SOLUTION) ×2 IMPLANT
WIPE NON LINTING 3.25X3.25 (MISCELLANEOUS) ×2 IMPLANT

## 2017-01-26 NOTE — Anesthesia Postprocedure Evaluation (Signed)
Anesthesia Post Note  Patient: Larry Lane  Procedure(s) Performed: CATARACT EXTRACTION PHACO AND INTRAOCULAR LENS PLACEMENT ([place]) (Right Eye)  Patient location during evaluation: Short Stay Anesthesia Type: MAC Level of consciousness: awake Pain management: pain level controlled Vital Signs Assessment: vitals unstable and post-procedure vital signs reviewed and stable Respiratory status: spontaneous breathing Cardiovascular status: blood pressure returned to baseline and stable Postop Assessment: no headache Anesthetic complications: no     Last Vitals:  Vitals:   01/26/17 0612 01/26/17 0808  BP: (!) 159/73 (!) 157/73  Pulse: 66 (!) 58  Resp: 16 16  Temp: (!) 36.3 C (!) 36.3 C  SpO2: 99% 98%    Last Pain:  Vitals:   01/26/17 0808  TempSrc: Temporal                 Buckner Malta

## 2017-01-26 NOTE — Op Note (Signed)
OPERATIVE NOTE  Larry Lane [id number] 01/26/2017   PREOPERATIVE DIAGNOSIS:  Nuclear sclerotic cataract right eye.  H25.11   POSTOPERATIVE DIAGNOSIS:    Nuclear sclerotic cataract right eye.     PROCEDURE:  Phacoemusification with posterior chamber intraocular lens placement of the right eye   LENS:   Implant Name Type Inv. Item Serial No. Manufacturer Lot No. LRB No. Used  LENS IOL DIOP 18.5 - [id number] Intraocular Lens LENS IOL DIOP 18.5 [id number] AMO  Right 1       PCB00 +18.5   ULTRASOUND TIME: 0 minutes 48 seconds.  CDE 5.35   SURGEON:  Benay Pillow, MD, MPH  ANESTHESIOLOGIST: Anesthesiologist: Molli Barrows, MD CRNA: Allean Found, CRNA; Hedda Slade, CRNA   ANESTHESIA:  Topical with tetracaine drops augmented with 1% preservative-free intracameral lidocaine.  ESTIMATED BLOOD LOSS: less than 1 mL.   COMPLICATIONS:  None.   DESCRIPTION OF PROCEDURE:  The patient was identified in the holding room and transported to the operating room and placed in the supine position under the operating microscope.  The right eye was identified as the operative eye and it was prepped and draped in the usual sterile ophthalmic fashion.   A 1.0 millimeter clear-corneal paracentesis was made at the 10:30 position. 0.5 ml of preservative-free 1% lidocaine with epinephrine was injected into the anterior chamber.  The anterior chamber was filled with Healon 5 viscoelastic.  A 2.4 millimeter keratome was used to make a near-clear corneal incision at the 8:00 position.  A curvilinear capsulorrhexis was made with a cystotome and capsulorrhexis forceps.  Balanced salt solution was used to hydrodissect and hydrodelineate the nucleus.   Phacoemulsification was then used in stop and chop fashion to remove the lens nucleus and epinucleus.  The remaining cortex was then removed using the irrigation and aspiration handpiece. Healon was then placed into the capsular bag to distend it for lens  placement.  A lens was then injected into the capsular bag.  The remaining viscoelastic was aspirated.   Wounds were hydrated with balanced salt solution.  The anterior chamber was inflated to a physiologic pressure with balanced salt solution.   Intracameral vigamox 0.1 mL undiluted was injected into the eye and a drop placed onto the ocular surface.  No wound leaks were noted.  The patient was taken to the recovery room in stable condition without complications of anesthesia or surgery  Benay Pillow 01/26/2017, 8:04 AM

## 2017-01-26 NOTE — Discharge Instructions (Signed)
Eye Surgery Discharge Instructions  Expect mild scratchy sensation or mild soreness. DO NOT RUB YOUR EYE!  The day of surgery:  Minimal physical activity, but bed rest is not required  No reading, computer work, or close hand work  No bending, lifting, or straining.  May watch TV  For 24 hours:  No driving, legal decisions, or alcoholic beverages  Safety precautions  Eat anything you prefer: It is better to start with liquids, then soup then solid foods.  _____ Eye patch should be worn until postoperative exam tomorrow.  ____ Solar shield eyeglasses should be worn for comfort in the sunlight/patch while sleeping  Resume all regular medications including aspirin or Coumadin if these were discontinued prior to surgery. You may shower, bathe, shave, or wash your hair. Tylenol may be taken for mild discomfort.  Call your doctor if you experience significant pain, nausea, or vomiting, fever > 101 or other signs of infection. [phone] or [phone] Specific instructions:  Follow-up Information    Eulogio Bear, MD Follow up in 1 day(s).   Specialty:  Ophthalmology Why:  January 27, 2017 at 9:45 Contact information: [place] [id number] [phone]        Trisha Mangle, MD .   Specialty:  Emergency Medicine

## 2017-01-26 NOTE — H&P (Signed)
The History and Physical notes are on paper, have been signed, and are to be scanned.   I have examined the patient and there are no changes to the H&P.   Benay Pillow 01/26/2017 7:23 AM

## 2017-01-26 NOTE — Anesthesia Post-op Follow-up Note (Signed)
Anesthesia QCDR form completed.        

## 2017-01-26 NOTE — Anesthesia Procedure Notes (Signed)
Procedure Name: MAC Performed by: Hedda Slade, CRNA Pre-anesthesia Checklist: Patient identified, Emergency Drugs available, Suction available and Patient being monitored Patient Re-evaluated:Patient Re-evaluated prior to induction Oxygen Delivery Method: Nasal cannula

## 2017-01-26 NOTE — Anesthesia Preprocedure Evaluation (Signed)
Anesthesia Evaluation  Patient identified by MRN, date of birth, ID band Patient awake    Reviewed: Allergy & Precautions, H&P , NPO status , Patient's Chart, lab work & pertinent test results, reviewed documented beta blocker date and time   Airway Mallampati: II  TM Distance: >3 FB Neck ROM: full    Dental no notable dental hx. (+) Teeth Intact   Pulmonary neg pulmonary ROS,    Pulmonary exam normal breath sounds clear to auscultation       Cardiovascular Exercise Tolerance: Good hypertension, negative cardio ROS  + dysrhythmias  Rhythm:regular Rate:Normal     Neuro/Psych  Neuromuscular disease negative neurological ROS  negative psych ROS   GI/Hepatic negative GI ROS, Neg liver ROS, GERD  Medicated,  Endo/Other  negative endocrine ROSdiabetes  Renal/GU      Musculoskeletal   Abdominal   Peds  Hematology negative hematology ROS (+)   Anesthesia Other Findings   Reproductive/Obstetrics negative OB ROS                             Anesthesia Physical Anesthesia Plan  ASA: III  Anesthesia Plan: MAC   Post-op Pain Management:    Induction:   PONV Risk Score and Plan: 1  Airway Management Planned:   Additional Equipment:   Intra-op Plan:   Post-operative Plan:   Informed Consent: I have reviewed the patients History and Physical, chart, labs and discussed the procedure including the risks, benefits and alternatives for the proposed anesthesia with the patient or authorized representative who has indicated his/her understanding and acceptance.     Plan Discussed with: CRNA  Anesthesia Plan Comments:         Anesthesia Quick Evaluation

## 2017-01-26 NOTE — Transfer of Care (Signed)
Immediate Anesthesia Transfer of Care Note  Patient: Larry Lane  Procedure(s) Performed: CATARACT EXTRACTION PHACO AND INTRAOCULAR LENS PLACEMENT ([facility]) (Right Eye)  Patient Location: PACU  Anesthesia Type:MAC  Level of Consciousness: awake  Airway & Oxygen Therapy: Patient Spontanous Breathing and Patient connected to nasal cannula oxygen  Post-op Assessment: Report given to RN and Post -op Vital signs reviewed and stable  Post vital signs: Reviewed and stable  Last Vitals:  Vitals:   01/26/17 0612  BP: (!) 159/73  Pulse: 66  Resp: 16  Temp: (!) 36.3 C  SpO2: 99%    Last Pain:  Vitals:   01/26/17 0612  TempSrc: Oral         Complications: No apparent anesthesia complications

## 2019-03-19 ENCOUNTER — Ambulatory Visit: Payer: Medicare Other | Attending: Internal Medicine

## 2019-03-19 DIAGNOSIS — Z23 Encounter for immunization: Secondary | ICD-10-CM | POA: Insufficient documentation

## 2019-03-19 NOTE — Progress Notes (Signed)
   Covid-19 Vaccination Clinic  Name:  Larry Lane    MRN: [id number] DOB: 08/07/1940  03/19/2019  Mr. Larry Lane was observed post Covid-19 immunization for 15 minutes without incidence. He was provided with Vaccine Information Sheet and instruction to access the V-Safe system.   Mr. Larry Lane was instructed to call 911 with any severe reactions post vaccine: Marland Kitchen Difficulty breathing  . Swelling of your face and throat  . A fast heartbeat  . A bad rash all over your body  . Dizziness and weakness    Immunizations Administered    Name Date Dose VIS Date Route   Pfizer COVID-19 Vaccine 03/19/2019  5:20 PM 0.3 mL 02/08/2019 Intramuscular   Manufacturer: [place]   Lot: [id number]   [place]: [id number]

## 2019-04-10 ENCOUNTER — Ambulatory Visit: Payer: Self-pay

## 2019-04-10 ENCOUNTER — Ambulatory Visit: Payer: Medicare Other | Attending: Internal Medicine

## 2019-04-10 DIAGNOSIS — Z23 Encounter for immunization: Secondary | ICD-10-CM | POA: Insufficient documentation

## 2019-04-10 NOTE — Progress Notes (Signed)
.    Covid-19 Vaccination Clinic  Name:  Larry Lane    MRN: [id number] DOB: 06-26-1940  04/10/2019  [name] was observed post Covid-19 immunization for 15 minutes without incidence. He was provided with Vaccine Information Sheet and instruction to access the V-Safe system.   [name] was instructed to call 911 with any severe reactions post vaccine: Marland Kitchen Difficulty breathing  . Swelling of your face and throat  . A fast heartbeat  . A bad rash all over your body  . Dizziness and weakness    Immunizations Administered    Name Date Dose VIS Date Route   Pfizer COVID-19 Vaccine 04/10/2019  9:37 AM 0.3 mL 02/08/2019 Intramuscular   Manufacturer: [place]   Lot: [id number]   [place]: [id number]

## 2023-01-04 ENCOUNTER — Inpatient Hospital Stay (HOSPITAL_COMMUNITY): Payer: Medicare Other

## 2023-01-04 ENCOUNTER — Inpatient Hospital Stay (HOSPITAL_COMMUNITY)
Admission: EM | Admit: 2023-01-04 | Discharge: 2023-01-09 | DRG: 871 | Disposition: A | Discharge status: Rehabilitation Facility | Payer: Medicare Other | Attending: Internal Medicine | Admitting: Internal Medicine

## 2023-01-04 ENCOUNTER — Emergency Department (HOSPITAL_COMMUNITY): Payer: Medicare Other

## 2023-01-04 ENCOUNTER — Other Ambulatory Visit: Payer: Self-pay

## 2023-01-04 DIAGNOSIS — R6521 Severe sepsis with septic shock: Secondary | ICD-10-CM | POA: Diagnosis present

## 2023-01-04 DIAGNOSIS — A419 Sepsis, unspecified organism: Secondary | ICD-10-CM | POA: Diagnosis present

## 2023-01-04 DIAGNOSIS — E878 Other disorders of electrolyte and fluid balance, not elsewhere classified: Secondary | ICD-10-CM | POA: Diagnosis present

## 2023-01-04 DIAGNOSIS — R195 Other fecal abnormalities: Secondary | ICD-10-CM | POA: Diagnosis not present

## 2023-01-04 DIAGNOSIS — D649 Anemia, unspecified: Secondary | ICD-10-CM | POA: Diagnosis present

## 2023-01-04 DIAGNOSIS — N179 Acute kidney failure, unspecified: Secondary | ICD-10-CM | POA: Insufficient documentation

## 2023-01-04 DIAGNOSIS — K219 Gastro-esophageal reflux disease without esophagitis: Secondary | ICD-10-CM | POA: Diagnosis present

## 2023-01-04 DIAGNOSIS — Z01810 Encounter for preprocedural cardiovascular examination: Secondary | ICD-10-CM | POA: Diagnosis not present

## 2023-01-04 DIAGNOSIS — R5381 Other malaise: Secondary | ICD-10-CM | POA: Diagnosis not present

## 2023-01-04 DIAGNOSIS — K529 Noninfective gastroenteritis and colitis, unspecified: Secondary | ICD-10-CM | POA: Diagnosis present

## 2023-01-04 DIAGNOSIS — R008 Other abnormalities of heart beat: Secondary | ICD-10-CM | POA: Diagnosis not present

## 2023-01-04 DIAGNOSIS — R6889 Other general symptoms and signs: Secondary | ICD-10-CM | POA: Diagnosis not present

## 2023-01-04 DIAGNOSIS — I2699 Other pulmonary embolism without acute cor pulmonale: Secondary | ICD-10-CM | POA: Diagnosis present

## 2023-01-04 DIAGNOSIS — G5721 Lesion of femoral nerve, right lower limb: Secondary | ICD-10-CM | POA: Diagnosis not present

## 2023-01-04 DIAGNOSIS — E871 Hypo-osmolality and hyponatremia: Secondary | ICD-10-CM | POA: Diagnosis present

## 2023-01-04 DIAGNOSIS — Z961 Presence of intraocular lens: Secondary | ICD-10-CM | POA: Diagnosis present

## 2023-01-04 DIAGNOSIS — I2510 Atherosclerotic heart disease of native coronary artery without angina pectoris: Secondary | ICD-10-CM | POA: Diagnosis not present

## 2023-01-04 DIAGNOSIS — Z8249 Family history of ischemic heart disease and other diseases of the circulatory system: Secondary | ICD-10-CM | POA: Diagnosis not present

## 2023-01-04 DIAGNOSIS — M1990 Unspecified osteoarthritis, unspecified site: Secondary | ICD-10-CM | POA: Diagnosis present

## 2023-01-04 DIAGNOSIS — R7989 Other specified abnormal findings of blood chemistry: Secondary | ICD-10-CM | POA: Diagnosis present

## 2023-01-04 DIAGNOSIS — E861 Hypovolemia: Secondary | ICD-10-CM | POA: Diagnosis present

## 2023-01-04 DIAGNOSIS — R739 Hyperglycemia, unspecified: Secondary | ICD-10-CM

## 2023-01-04 DIAGNOSIS — E872 Acidosis, unspecified: Secondary | ICD-10-CM | POA: Diagnosis present

## 2023-01-04 DIAGNOSIS — I2609 Other pulmonary embolism with acute cor pulmonale: Secondary | ICD-10-CM | POA: Diagnosis not present

## 2023-01-04 DIAGNOSIS — E119 Type 2 diabetes mellitus without complications: Secondary | ICD-10-CM | POA: Diagnosis not present

## 2023-01-04 DIAGNOSIS — E1165 Type 2 diabetes mellitus with hyperglycemia: Secondary | ICD-10-CM | POA: Diagnosis present

## 2023-01-04 DIAGNOSIS — D62 Acute posthemorrhagic anemia: Secondary | ICD-10-CM | POA: Diagnosis not present

## 2023-01-04 DIAGNOSIS — E785 Hyperlipidemia, unspecified: Secondary | ICD-10-CM | POA: Diagnosis present

## 2023-01-04 DIAGNOSIS — R6520 Severe sepsis without septic shock: Secondary | ICD-10-CM | POA: Diagnosis not present

## 2023-01-04 DIAGNOSIS — Z794 Long term (current) use of insulin: Secondary | ICD-10-CM

## 2023-01-04 DIAGNOSIS — K922 Gastrointestinal hemorrhage, unspecified: Secondary | ICD-10-CM | POA: Diagnosis not present

## 2023-01-04 DIAGNOSIS — Z9181 History of falling: Secondary | ICD-10-CM

## 2023-01-04 DIAGNOSIS — G9341 Metabolic encephalopathy: Secondary | ICD-10-CM | POA: Diagnosis present

## 2023-01-04 DIAGNOSIS — R471 Dysarthria and anarthria: Secondary | ICD-10-CM | POA: Diagnosis present

## 2023-01-04 DIAGNOSIS — S7412XD Injury of femoral nerve at hip and thigh level, left leg, subsequent encounter: Secondary | ICD-10-CM | POA: Diagnosis not present

## 2023-01-04 DIAGNOSIS — R9431 Abnormal electrocardiogram [ECG] [EKG]: Secondary | ICD-10-CM | POA: Diagnosis not present

## 2023-01-04 DIAGNOSIS — M48061 Spinal stenosis, lumbar region without neurogenic claudication: Secondary | ICD-10-CM | POA: Diagnosis present

## 2023-01-04 DIAGNOSIS — I700 Atherosclerosis of aorta: Secondary | ICD-10-CM | POA: Diagnosis present

## 2023-01-04 DIAGNOSIS — N170 Acute kidney failure with tubular necrosis: Secondary | ICD-10-CM | POA: Diagnosis present

## 2023-01-04 DIAGNOSIS — Z79899 Other long term (current) drug therapy: Secondary | ICD-10-CM

## 2023-01-04 DIAGNOSIS — Z6830 Body mass index (BMI) 30.0-30.9, adult: Secondary | ICD-10-CM | POA: Diagnosis not present

## 2023-01-04 DIAGNOSIS — G4700 Insomnia, unspecified: Secondary | ICD-10-CM | POA: Diagnosis present

## 2023-01-04 DIAGNOSIS — R269 Unspecified abnormalities of gait and mobility: Secondary | ICD-10-CM | POA: Diagnosis present

## 2023-01-04 DIAGNOSIS — K56600 Partial intestinal obstruction, unspecified as to cause: Secondary | ICD-10-CM | POA: Insufficient documentation

## 2023-01-04 DIAGNOSIS — G500 Trigeminal neuralgia: Secondary | ICD-10-CM | POA: Diagnosis present

## 2023-01-04 DIAGNOSIS — M25552 Pain in left hip: Secondary | ICD-10-CM | POA: Diagnosis present

## 2023-01-04 DIAGNOSIS — I50810 Right heart failure, unspecified: Secondary | ICD-10-CM | POA: Diagnosis not present

## 2023-01-04 DIAGNOSIS — E669 Obesity, unspecified: Secondary | ICD-10-CM | POA: Diagnosis present

## 2023-01-04 DIAGNOSIS — Z9841 Cataract extraction status, right eye: Secondary | ICD-10-CM

## 2023-01-04 DIAGNOSIS — I2489 Other forms of acute ischemic heart disease: Secondary | ICD-10-CM | POA: Diagnosis present

## 2023-01-04 DIAGNOSIS — I119 Hypertensive heart disease without heart failure: Secondary | ICD-10-CM | POA: Diagnosis present

## 2023-01-04 DIAGNOSIS — Z7982 Long term (current) use of aspirin: Secondary | ICD-10-CM

## 2023-01-04 DIAGNOSIS — R6510 Systemic inflammatory response syndrome (SIRS) of non-infectious origin without acute organ dysfunction: Secondary | ICD-10-CM | POA: Diagnosis present

## 2023-01-04 DIAGNOSIS — R609 Edema, unspecified: Secondary | ICD-10-CM | POA: Diagnosis not present

## 2023-01-04 DIAGNOSIS — Z7901 Long term (current) use of anticoagulants: Secondary | ICD-10-CM

## 2023-01-04 LAB — HEMOGLOBIN A1C
Hgb A1c MFr Bld: 7.6 % — ABNORMAL HIGH (ref 4.8–5.6)
Mean Plasma Glucose: 171.42 mg/dL

## 2023-01-04 LAB — CBC WITH DIFFERENTIAL/PLATELET
Abs Immature Granulocytes: 0.02 10*3/uL (ref 0.00–0.07)
Basophils Absolute: 0 10*3/uL (ref 0.0–0.1)
Basophils Relative: 0 %
Eosinophils Absolute: 0 10*3/uL (ref 0.0–0.5)
Eosinophils Relative: 0 %
HCT: 35.1 % — ABNORMAL LOW (ref 39.0–52.0)
Hemoglobin: 11.7 g/dL — ABNORMAL LOW (ref 13.0–17.0)
Immature Granulocytes: 0 %
Lymphocytes Relative: 3 %
Lymphs Abs: 0.2 10*3/uL — ABNORMAL LOW (ref 0.7–4.0)
MCH: 31.2 pg (ref 26.0–34.0)
MCHC: 33.3 g/dL (ref 30.0–36.0)
MCV: 93.6 fL (ref 80.0–100.0)
Monocytes Absolute: 0.2 10*3/uL (ref 0.1–1.0)
Monocytes Relative: 2 %
Neutro Abs: 7.6 10*3/uL (ref 1.7–7.7)
Neutrophils Relative %: 95 %
Platelets: 271 10*3/uL (ref 150–400)
RBC: 3.75 MIL/uL — ABNORMAL LOW (ref 4.22–5.81)
RDW: 11.9 % (ref 11.5–15.5)
WBC: 8 10*3/uL (ref 4.0–10.5)
nRBC: 0 % (ref 0.0–0.2)

## 2023-01-04 LAB — GLUCOSE, CAPILLARY
Glucose-Capillary: 136 mg/dL — ABNORMAL HIGH (ref 70–99)
Glucose-Capillary: 117 mg/dL — ABNORMAL HIGH (ref 70–99)

## 2023-01-04 LAB — MENINGITIS/ENCEPHALITIS PANEL (CSF)

## 2023-01-04 LAB — PROTEIN AND GLUCOSE, CSF
Glucose, CSF: 83 mg/dL — ABNORMAL HIGH (ref 40–70)
Total  Protein, CSF: 48 mg/dL — ABNORMAL HIGH (ref 15–45)

## 2023-01-04 LAB — CSF CELL COUNT WITH DIFFERENTIAL
RBC Count, CSF: 1 /mm3 — ABNORMAL HIGH
Tube #: 1
WBC, CSF: 0 /mm3 (ref 0–5)

## 2023-01-04 LAB — CBG MONITORING, ED
Glucose-Capillary: 146 mg/dL — ABNORMAL HIGH (ref 70–99)
Glucose-Capillary: 119 mg/dL — ABNORMAL HIGH (ref 70–99)
Glucose-Capillary: 131 mg/dL — ABNORMAL HIGH (ref 70–99)
Glucose-Capillary: 127 mg/dL — ABNORMAL HIGH (ref 70–99)

## 2023-01-04 LAB — URINALYSIS, W/ REFLEX TO CULTURE (INFECTION SUSPECTED)
Bacteria, UA: NONE SEEN
Bilirubin Urine: NEGATIVE
Glucose, UA: NEGATIVE mg/dL
Hgb urine dipstick: NEGATIVE
Ketones, ur: NEGATIVE mg/dL
Leukocytes,Ua: NEGATIVE
Nitrite: NEGATIVE
Protein, ur: NEGATIVE mg/dL
Specific Gravity, Urine: 1.018 (ref 1.005–1.030)
pH: 5 (ref 5.0–8.0)

## 2023-01-04 LAB — HEPARIN LEVEL (UNFRACTIONATED)
Heparin Unfractionated: 0.27 [IU]/mL — ABNORMAL LOW (ref 0.30–0.70)
Heparin Unfractionated: 0.84 [IU]/mL — ABNORMAL HIGH (ref 0.30–0.70)

## 2023-01-04 LAB — I-STAT CG4 LACTIC ACID, ED
Lactic Acid, Venous: 1.6 mmol/L (ref 0.5–1.9)
Lactic Acid, Venous: 2 mmol/L (ref 0.5–1.9)

## 2023-01-04 LAB — SARS CORONAVIRUS 2 BY RT PCR: SARS Coronavirus 2 by RT PCR: NEGATIVE

## 2023-01-04 LAB — COMPREHENSIVE METABOLIC PANEL
ALT: 29 U/L (ref 0–44)
AST: 40 U/L (ref 15–41)
Albumin: 3.1 g/dL — ABNORMAL LOW (ref 3.5–5.0)
Alkaline Phosphatase: 65 U/L (ref 38–126)
Anion gap: 10 (ref 5–15)
BUN: 21 mg/dL (ref 8–23)
CO2: 19 mmol/L — ABNORMAL LOW (ref 22–32)
Calcium: 8 mg/dL — ABNORMAL LOW (ref 8.9–10.3)
Chloride: 103 mmol/L (ref 98–111)
Creatinine, Ser: 1.34 mg/dL — ABNORMAL HIGH (ref 0.61–1.24)
GFR, Estimated: 54 mL/min — ABNORMAL LOW (ref >60)
Glucose, Bld: 182 mg/dL — ABNORMAL HIGH (ref 70–99)
Potassium: 3.9 mmol/L (ref 3.5–5.1)
Sodium: 132 mmol/L — ABNORMAL LOW (ref 135–145)
Total Bilirubin: 0.7 mg/dL (ref <1.2)
Total Protein: 5.5 g/dL — ABNORMAL LOW (ref 6.5–8.1)

## 2023-01-04 LAB — MRSA NEXT GEN BY PCR, NASAL: MRSA by PCR Next Gen: NOT DETECTED

## 2023-01-04 LAB — TROPONIN I (HIGH SENSITIVITY)
Troponin I (High Sensitivity): 47 ng/L — ABNORMAL HIGH (ref <18)
Troponin I (High Sensitivity): 77 ng/L — ABNORMAL HIGH (ref <18)
Troponin I (High Sensitivity): 3448 ng/L (ref <18)

## 2023-01-04 LAB — PROTIME-INR
INR: 1 (ref 0.8–1.2)
Prothrombin Time: 13.4 s (ref 11.4–15.2)

## 2023-01-04 LAB — LACTIC ACID, PLASMA
Lactic Acid, Venous: 1.7 mmol/L (ref 0.5–1.9)
Lactic Acid, Venous: 1.9 mmol/L (ref 0.5–1.9)
Lactic Acid, Venous: 1.3 mmol/L (ref 0.5–1.9)

## 2023-01-04 LAB — APTT: aPTT: 27 s (ref 24–36)

## 2023-01-04 MED ORDER — PIPERACILLIN-TAZOBACTAM 3.375 G IVPB 30 MIN
3.3750 g | Freq: Once | INTRAVENOUS | Status: AC
  Administered 2023-01-04: 3.375 g via INTRAVENOUS
  Filled 2023-01-04: qty 50

## 2023-01-04 MED ORDER — INSULIN ASPART 100 UNIT/ML IJ SOLN
0.0000 [IU] | INTRAMUSCULAR | Status: DC
  Administered 2023-01-04 – 2023-01-05 (×3): 2 [IU] via SUBCUTANEOUS
  Administered 2023-01-05 (×2): 3 [IU] via SUBCUTANEOUS

## 2023-01-04 MED ORDER — CARBAMAZEPINE ER 200 MG PO TB12
200.0000 mg | ORAL_TABLET | Freq: Four times a day (QID) | ORAL | Status: DC
  Administered 2023-01-04 – 2023-01-09 (×18): 200 mg via ORAL
  Filled 2023-01-04 (×26): qty 1

## 2023-01-04 MED ORDER — SODIUM CHLORIDE 0.9 % IV SOLN: 3.0000 g | Freq: Four times a day (QID) | INTRAVENOUS | Status: DC

## 2023-01-04 MED ORDER — VANCOMYCIN HCL 750 MG/150ML IV SOLN
750.0000 mg | Freq: Two times a day (BID) | INTRAVENOUS | Status: DC
  Administered 2023-01-05 – 2023-01-06 (×3): 750 mg via INTRAVENOUS
  Filled 2023-01-04 (×4): qty 150

## 2023-01-04 MED ORDER — VANCOMYCIN HCL 2000 MG/400ML IV SOLN
2000.0000 mg | Freq: Once | INTRAVENOUS | Status: AC
  Administered 2023-01-04: 2000 mg via INTRAVENOUS
  Filled 2023-01-04 (×2): qty 400

## 2023-01-04 MED ORDER — OXYCODONE HCL 5 MG PO TABS: 5.0000 mg | ORAL_TABLET | ORAL | Status: DC | PRN

## 2023-01-04 MED ORDER — ACETAMINOPHEN 325 MG PO TABS
650.0000 mg | ORAL_TABLET | Freq: Once | ORAL | Status: AC
  Administered 2023-01-04: 650 mg via ORAL
  Filled 2023-01-04: qty 2

## 2023-01-04 MED ORDER — ACETAMINOPHEN 325 MG PO TABS
650.0000 mg | ORAL_TABLET | Freq: Four times a day (QID) | ORAL | Status: DC | PRN
  Filled 2023-01-04: qty 2

## 2023-01-04 MED ORDER — INSULIN ASPART 100 UNIT/ML IJ SOLN
0.0000 [IU] | Freq: Every day | INTRAMUSCULAR | Status: DC
Start: 2023-01-04 — End: 2023-01-09
  Administered 2023-01-06: 2 [IU] via SUBCUTANEOUS

## 2023-01-04 MED ORDER — ROSUVASTATIN CALCIUM 5 MG PO TABS
10.0000 mg | ORAL_TABLET | Freq: Every day | ORAL | Status: DC
  Administered 2023-01-04 – 2023-01-09 (×6): 10 mg via ORAL
  Filled 2023-01-04 (×6): qty 2

## 2023-01-04 MED ORDER — SODIUM CHLORIDE 0.9% FLUSH: 10.0000 mL | INTRAVENOUS | Status: DC | PRN

## 2023-01-04 MED ORDER — HYDRALAZINE HCL 20 MG/ML IJ SOLN
10.0000 mg | Freq: Four times a day (QID) | INTRAMUSCULAR | Status: DC | PRN
  Administered 2023-01-05 – 2023-01-07 (×2): 10 mg via INTRAVENOUS
  Filled 2023-01-04 (×3): qty 1

## 2023-01-04 MED ORDER — ONDANSETRON HCL 4 MG PO TABS: 4.0000 mg | ORAL_TABLET | Freq: Four times a day (QID) | ORAL | Status: DC | PRN

## 2023-01-04 MED ORDER — GABAPENTIN 300 MG PO CAPS
600.0000 mg | ORAL_CAPSULE | Freq: Three times a day (TID) | ORAL | Status: DC
Start: 2023-01-04 — End: 2023-01-04
  Administered 2023-01-04: 600 mg via ORAL
  Filled 2023-01-04: qty 2

## 2023-01-04 MED ORDER — SODIUM CHLORIDE 0.9 % IV SOLN
2.0000 g | Freq: Four times a day (QID) | INTRAVENOUS | Status: DC
  Filled 2023-01-04 (×3): qty 2000

## 2023-01-04 MED ORDER — ACETAMINOPHEN 650 MG RE SUPP
650.0000 mg | Freq: Four times a day (QID) | RECTAL | Status: DC | PRN
Start: 2023-01-04 — End: 2023-01-04

## 2023-01-04 MED ORDER — SODIUM CHLORIDE 0.9% FLUSH
10.0000 mL | Freq: Two times a day (BID) | INTRAVENOUS | Status: DC
  Administered 2023-01-04 – 2023-01-09 (×8): 10 mL

## 2023-01-04 MED ORDER — CHLORHEXIDINE GLUCONATE CLOTH 2 % EX PADS
6.0000 | MEDICATED_PAD | Freq: Every day | CUTANEOUS | Status: DC
  Administered 2023-01-04 – 2023-01-08 (×5): 6 via TOPICAL

## 2023-01-04 MED ORDER — SODIUM CHLORIDE 0.9 % IV SOLN: 1.0000 g | Freq: Four times a day (QID) | INTRAVENOUS | Status: DC

## 2023-01-04 MED ORDER — SODIUM CHLORIDE 0.9 % IV BOLUS
1000.0000 mL | Freq: Once | INTRAVENOUS | Status: AC
  Administered 2023-01-04: 1000 mL via INTRAVENOUS

## 2023-01-04 MED ORDER — LIDOCAINE HCL (PF) 1 % IJ SOLN
5.0000 mL | Freq: Once | INTRAMUSCULAR | Status: AC
Start: 2023-01-04 — End: 2023-01-04
  Administered 2023-01-04: 3 mL

## 2023-01-04 MED ORDER — HEPARIN BOLUS VIA INFUSION
1000.0000 [IU] | Freq: Once | INTRAVENOUS | Status: AC
  Administered 2023-01-04: 1000 [IU] via INTRAVENOUS
  Filled 2023-01-04: qty 1000

## 2023-01-04 MED ORDER — IOHEXOL 350 MG/ML SOLN
75.0000 mL | Freq: Once | INTRAVENOUS | Status: AC | PRN
  Administered 2023-01-04: 75 mL via INTRAVENOUS

## 2023-01-04 MED ORDER — DOCUSATE SODIUM 100 MG PO CAPS: 100.0000 mg | ORAL_CAPSULE | Freq: Two times a day (BID) | ORAL | Status: DC | PRN

## 2023-01-04 MED ORDER — PIPERACILLIN-TAZOBACTAM 3.375 G IVPB: 3.3750 g | Freq: Three times a day (TID) | INTRAVENOUS | Status: DC

## 2023-01-04 MED ORDER — CEFTRIAXONE SODIUM 1 G IJ SOLR
1.0000 g | Freq: Once | INTRAMUSCULAR | Status: AC
  Administered 2023-01-04: 1 g via INTRAVENOUS
  Filled 2023-01-04: qty 10

## 2023-01-04 MED ORDER — DEXTROSE 5 % IV SOLN
10.0000 mg/kg | Freq: Two times a day (BID) | INTRAVENOUS | Status: DC
  Administered 2023-01-04 – 2023-01-05 (×2): 720 mg via INTRAVENOUS
  Filled 2023-01-04 (×5): qty 14.4

## 2023-01-04 MED ORDER — ONDANSETRON HCL 4 MG/2ML IJ SOLN
4.0000 mg | Freq: Four times a day (QID) | INTRAMUSCULAR | Status: DC | PRN
Start: 2023-01-04 — End: 2023-01-09

## 2023-01-04 MED ORDER — ORAL CARE MOUTH RINSE
15.0000 mL | OROMUCOSAL | Status: DC
  Administered 2023-01-04 – 2023-01-09 (×20): 15 mL via OROMUCOSAL

## 2023-01-04 MED ORDER — ORAL CARE MOUTH RINSE: 15.0000 mL | OROMUCOSAL | Status: DC | PRN

## 2023-01-04 MED ORDER — ACETAMINOPHEN 500 MG PO TABS
500.0000 mg | ORAL_TABLET | ORAL | Status: DC | PRN
  Administered 2023-01-04: 500 mg via ORAL
  Filled 2023-01-04: qty 1

## 2023-01-04 MED ORDER — ALBUTEROL SULFATE (2.5 MG/3ML) 0.083% IN NEBU
2.5000 mg | INHALATION_SOLUTION | Freq: Four times a day (QID) | RESPIRATORY_TRACT | Status: DC | PRN
Start: 2023-01-04 — End: 2023-01-09

## 2023-01-04 MED ORDER — NOREPINEPHRINE 4 MG/250ML-% IV SOLN
2.0000 ug/min | INTRAVENOUS | Status: DC
  Administered 2023-01-04: 2 ug/min via INTRAVENOUS
  Administered 2023-01-04: 3 ug/min via INTRAVENOUS
  Administered 2023-01-05: 7 ug/min via INTRAVENOUS
  Filled 2023-01-04 (×3): qty 250

## 2023-01-04 MED ORDER — SODIUM CHLORIDE 0.9 % IV SOLN
2.0000 g | Freq: Two times a day (BID) | INTRAVENOUS | Status: DC
  Administered 2023-01-04 – 2023-01-05 (×2): 2 g via INTRAVENOUS
  Filled 2023-01-04 (×2): qty 20

## 2023-01-04 MED ORDER — HEPARIN BOLUS VIA INFUSION
5000.0000 [IU] | Freq: Once | INTRAVENOUS | Status: AC
Start: 2023-01-04 — End: 2023-01-04
  Administered 2023-01-04: 5000 [IU] via INTRAVENOUS
  Filled 2023-01-04: qty 5000

## 2023-01-04 MED ORDER — HEPARIN (PORCINE) 25000 UT/250ML-% IV SOLN
1600.0000 [IU]/h | INTRAVENOUS | Status: DC
  Administered 2023-01-04: 1250 [IU]/h via INTRAVENOUS
  Administered 2023-01-04: 1100 [IU]/h via INTRAVENOUS
  Administered 2023-01-04 – 2023-01-05 (×2): 1250 [IU]/h via INTRAVENOUS
  Administered 2023-01-06: 1400 [IU]/h via INTRAVENOUS
  Administered 2023-01-07 – 2023-01-09 (×4): 1600 [IU]/h via INTRAVENOUS
  Filled 2023-01-04 (×10): qty 250

## 2023-01-04 MED ORDER — LACTATED RINGERS IV BOLUS
1000.0000 mL | Freq: Once | INTRAVENOUS | Status: AC
  Administered 2023-01-04: 1000 mL via INTRAVENOUS

## 2023-01-04 MED ORDER — SODIUM CHLORIDE 0.9 % IV BOLUS (SEPSIS)
500.0000 mL | Freq: Once | INTRAVENOUS | Status: AC
  Administered 2023-01-04: 500 mL via INTRAVENOUS

## 2023-01-04 MED ORDER — ACETAMINOPHEN 325 MG PO TABS: 650.0000 mg | ORAL_TABLET | ORAL | Status: DC | PRN

## 2023-01-04 MED ORDER — FENTANYL CITRATE PF 50 MCG/ML IJ SOSY
12.5000 ug | PREFILLED_SYRINGE | INTRAMUSCULAR | Status: DC | PRN
  Administered 2023-01-04: 50 ug via INTRAVENOUS
  Filled 2023-01-04: qty 1

## 2023-01-04 MED ORDER — SODIUM CHLORIDE 0.9 % IV SOLN
2.0000 g | Freq: Four times a day (QID) | INTRAVENOUS | Status: DC
  Administered 2023-01-04 – 2023-01-05 (×3): 2 g via INTRAVENOUS
  Filled 2023-01-04 (×4): qty 2000

## 2023-01-04 MED ORDER — INSULIN ASPART 100 UNIT/ML IJ SOLN: 0.0000 [IU] | Freq: Three times a day (TID) | INTRAMUSCULAR | Status: DC

## 2023-01-04 MED ORDER — SODIUM CHLORIDE 0.9 % IV SOLN: INTRAVENOUS | Status: DC

## 2023-01-04 MED ORDER — POLYETHYLENE GLYCOL 3350 17 G PO PACK: 17.0000 g | PACK | Freq: Every day | ORAL | Status: DC | PRN

## 2023-01-04 MED ORDER — ACETAMINOPHEN 325 MG PO TABS
650.0000 mg | ORAL_TABLET | Freq: Four times a day (QID) | ORAL | Status: DC | PRN
Start: 2023-01-04 — End: 2023-01-04
  Administered 2023-01-04: 650 mg via ORAL

## 2023-01-04 MED ORDER — PANTOPRAZOLE SODIUM 40 MG PO TBEC
40.0000 mg | DELAYED_RELEASE_TABLET | Freq: Every day | ORAL | Status: DC
Start: 2023-01-04 — End: 2023-01-09
  Administered 2023-01-04 – 2023-01-09 (×6): 40 mg via ORAL
  Filled 2023-01-04 (×6): qty 1

## 2023-01-04 MED ORDER — SODIUM CHLORIDE 0.9 % IV SOLN
250.0000 mL | INTRAVENOUS | Status: AC
  Administered 2023-01-04: 250 mL via INTRAVENOUS

## 2023-01-04 NOTE — H&P (Signed)
[facility] History and Physical  CHRIST FULLENWIDER WUJ:[id number] DOB: [birth year] DOA: 01/04/2023 PCP: Jerl Mina, MD  Presented from: Home Chief Complaint: Abdominal pain, chills  History of Present Illness: Larry Lane is a 82 y.o. male with PMH significant for DM2, HTN, HLD, trigeminal neuralgia, GERD, arthritis Lives at home with wife. Able to ambulate without assistive devices.  11/5, patient complained of abdominal cramping and leg cramps.  He felt warm and gradually became confused.  EMS was called.  EMS noted a temperature of 99.2, O2 sat 89% on room air, gave him normal saline and brought to the ED shortly after midnight. EDP note patient complained of penile pain.  Per patient's wife, he did not complain of any burning urination, nausea vomiting diarrhea.  In the ED he had a fever of 102.4, tachycardic to low 100s, blood pressure in 100s. Initial labs with WC count 8, hemoglobin 11.7, BUN/creatinine 21/1.34, sodium 132,  lactic acid 1.6 >>2 troponin 47 >>77 Urinalysis showed hazy yellow urine with negative leukocytes, negative nitrite, no bacteria EKG showed sinus tachycardia at 102 bpm, no ST-T wave changes, QTc 434 ms. COVID PCR negative CT head did not show any acute intracranial abnormality.  It showed right suboccipital craniotomy with focal encephalomalacia within the subjacent right cerebellar hemisphere. CT angio chest showed  -questionable left upper lobe and left lower lobe pulmonary embolism, mildly distended right ventricle. -Segmental thickening of the walls of the terminal ileum possibly infectious or inflammatory ileitis with dilatation of the small bowel proximal to the segment resulting in partial or early small bowel obstruction  Blood culture was sent Patient was started on IV Rocephin Also started on IV heparin drip for possible PE Hospitalist service was consulted for in-hospital management.  I received this patient as a carryover  admission this morning. After initial fever last night, patient was given Tylenol and since then his temperature was consistently less than 100, blood pressure in 90s. At the time of my evaluation, patient felt very warm, was having chills, diaphoresis, heart rate in the monitor was 123.  Temperature 103 for which he was given Tylenol.  His wife was at bedside and looked visibly anxious and overwhelmed. History reviewed and detailed as above.   Review of Systems:  All systems were reviewed and were negative unless otherwise mentioned in the HPI   Past medical history: Past Medical History:  Diagnosis Date   Abscess of back    Arthritis    Bacterial infection 2015   Cancer (HCC)    skin    Diabetes mellitus without complication (HCC)    Dysrhythmia    Edema    RIGHT ANKLE   GERD (gastroesophageal reflux disease)    Hypertension    Trigeminal nerve disorder    Trigeminal neuralgia     Past surgical history: Past Surgical History:  Procedure Laterality Date   APPENDECTOMY  2010   CATARACT EXTRACTION W/PHACO Right 01/26/2017   Procedure: CATARACT EXTRACTION PHACO AND INTRAOCULAR LENS PLACEMENT (IOC);  Surgeon: Nevada Crane, MD;  Location: [facility] ORS;  Service: Ophthalmology;  Laterality: Right;  Lot # [id number] H [place]: 00:48.2 AP%: 9.9 CDE: 5.35   I&D of back abscess     TRIGEMINAL NERVE DECOMPRESSION      Social History:  reports that he has never smoked. He has never used smokeless tobacco. He reports that he does not drink alcohol and does not use drugs.  Allergies:  No Known Allergies Patient has no known allergies.  Family history:  Family History  Problem Relation Age of Onset   Heart disease Father      Physical Exam: Vitals:   01/04/23 0633 01/04/23 0634 01/04/23 0747 01/04/23 0835  BP:  (!) 95/53    Pulse: 100     Resp:      Temp:   99.8 F (37.7 C) (!) 103 F (39.4 C)  TempSrc:   Oral Oral  SpO2: 100%     Weight:      Height:       Wt  Readings from Last 3 Encounters:  01/04/23 84 kg  01/26/17 83.9 kg  06/16/15 84 kg   Body mass index is 29.89 kg/m.  General exam: Elderly Caucasian male.  Awake but altered.  Warm to touch.  Visibly diaphoretic Skin: No rashes, lesions or ulcers. HEENT: Atraumatic, normocephalic, no obvious bleeding Lungs: Tachypneic while febrile, clear to auscultation bilaterally CVS: Regular tachycardia present, no murmur GI/Abd soft, mild left lower quadrant tenderness, mild distention present, bowel sound present CNS: Alert, awake, only able to utter few words.  Not restless or agitated Psychiatry: Mood appropriate Extremities: No pedal edema, no calf tenderness   ------------------------------------------------------------------------------------------------------ Assessment/Plan: Sepsis POA Presented with fever, abdominal pain, altered mental status WBC count normal but has noted fever, tachypnea, leukocytosis, AKI -met sepsis parameter CT abdomen with possible infectious or inflammatory ileitis Urinalysis unremarkable, chest imaging without pneumonia Blood culture sent. Patient was started on IV Rocephin earlier. Since the source seems to be abdominal in nature, I will switch to IV Zosyn. Monitor temperature, blood pressure trend.   Start IV hydration.  Repeat lactic acid at noon Recent Labs  Lab 01/04/23 0049 01/04/23 0119 01/04/23 0307  WBC 8.0  --   --   LATICACIDVEN  --  1.6 2.0*   Partial or early bowel obstruction CT abdomen showed segmental thickening of the walls of the terminal ileum possibly infectious or inflammatory ileitis with dilatation of the small bowel proximal to the segment resulting in partial or early small bowel obstruction Expect improvement in the inflammation and obstruction with IV antibiotics. Has no nausea, vomiting.  Start clear liquid only for now if able to tolerate. If symptoms worsen, may need surgical consultation as well.  Possible PE CT angio  showed possible left upper lobe and left lower lobe pulmonary embolism, mildly distended right ventricle. Obtain VQ scan.  Obtain echocardiogram  Elevated troponin Mildly elevated troponin likely due to possible PE versus sepsis. No anginal symptoms EKG without ST-T wave changes Look out for WMA in echocardiogram Recent Labs    01/04/23 0049 01/04/23 0240  TROPONINIHS 47* 77*    Hypotension H/o hypertension Blood pressure currently running low in 90s because of sepsis.  On IV fluid. PTA meds- amlodipine 5 mg daily, losartan 50 mg daily Hold BP meds for now.  Continue to monitor blood pressure.  AKI Per Care Everywhere, baseline creatinine 0.9 in September.   Presented with creatinine elevated to 1.34 Expect improvement with IV fluid. Continue to monitor Recent Labs    01/04/23 0049  BUN 21  CREATININE 1.34*   Hyponatremia Sodium level low likely due to sepsis.  Continue to monitor on fluid Recent Labs  Lab 01/04/23 0049  NA 132*   Type 2 diabetes mellitus A1c ordered PTA meds-insulin pump.  Will ask patient to stop it at this time Start SSI/Accu-Cheks No results found for: "HGBA1C" Recent Labs  Lab 01/04/23 0806  GLUCAP 146*   HLD Continue Crestor  Trigeminal neuralgia  Earlier in the day yesterday 11/5, patient had a routine follow-up with his neurologist for trigeminal neuralgia.  Recommended to increase the dose of carbamazepine. PTA meds- Neurontin 600 mg 3 times daily, carbamazepine 200 mg 4 times daily. Resume both  GERD Continue PPI  Arthritis Pain control Tylenol  ??  Unclear why patient was taking cholestyramine twice daily    Mobility: Independent at baseline.  Goals of care   Code Status: Full Code    DVT prophylaxis: Currently heparin drip.  If PE ruled out on VQ scan, will switch to Lovenox subcu   Antimicrobials: IV Zosyn Fluid: NS 125 mL/h Consultants: None Family Communication: Wife at bedside   Dispo: The patient is from:  Home              Anticipated d/c is to: Home most likely in next 2 to 3 days after clinical improvement  Diet: Diet Order             Diet clear liquid Room service appropriate? Yes; Fluid consistency: Thin  Diet effective now                    ------------------------------------------------------------------------------------- Severity of Illness: The appropriate patient status for this patient is INPATIENT. Inpatient status is judged to be reasonable and necessary in order to provide the required intensity of service to ensure the patient's safety. The patient's presenting symptoms, physical exam findings, and initial radiographic and laboratory data in the context of their chronic comorbidities is felt to place them at high risk for further clinical deterioration. Furthermore, it is not anticipated that the patient will be medically stable for discharge from the hospital within 2 midnights of admission.   * I certify that at the point of admission it is my clinical judgment that the patient will require inpatient hospital care spanning beyond 2 midnights from the point of admission due to high intensity of service, high risk for further deterioration and high frequency of surveillance required.* -------------------------------------------------------------------------------------   Home Meds: Prior to Admission medications   Medication Sig Start Date End Date Taking? Authorizing Provider  acetaminophen (TYLENOL) 500 MG tablet Take 1,000 mg by mouth every 6 (six) hours as needed.   Yes [provider]  amLODipine (NORVASC) 5 MG tablet Take 1 tablet by mouth daily. 05/23/22  Yes [provider]  Apoaequorin 10 MG CAPS Take 1 capsule by mouth daily. Prevagen   Yes [provider]  aspirin EC 81 MG tablet Take 325 mg by mouth daily.   Yes [provider]  carbamazepine (CARBATROL) 200 MG 12 hr capsule Take 200 mg by mouth 4 (four) times daily.   Yes  [provider]  cholestyramine light (PREVALITE) 4 GM/DOSE powder Take 4 g by mouth 2 (two) times daily with a meal. 12/01/22  Yes [provider]  gabapentin (NEURONTIN) 600 MG tablet Take 600 mg by mouth 3 (three) times daily. 12/19/22  Yes [provider]  glucagon (GLUCAGON EMERGENCY) 1 MG injection Inject 1 mg into the skin. 06/18/13  Yes [provider]  insulin lispro (HUMALOG) 100 UNIT/ML injection Inject 20 Units into the skin once. Inject 20 units once per day; can bolus up to 56 units once a day as directed via VGO Pump   Yes [provider]  losartan (COZAAR) 50 MG tablet Take 50 mg daily by mouth.   Yes [provider]  Multiple Vitamin (MULTI-VITAMIN) tablet Take 1 tablet by mouth daily.   Yes  [provider]  omeprazole (PRILOSEC) 20 MG capsule Take 20 mg by mouth daily.   Yes [provider]  rosuvastatin (CRESTOR) 10 MG tablet Take 1 tablet by mouth daily. 11/24/22  Yes [provider]    Labs on Admission:   CBC: Recent Labs  Lab 01/04/23 0049  WBC 8.0  NEUTROABS 7.6  HGB 11.7*  HCT 35.1*  MCV 93.6  PLT 271    Basic Metabolic Panel: Recent Labs  Lab 01/04/23 0049  NA 132*  K 3.9  CL 103  CO2 19*  GLUCOSE 182*  BUN 21  CREATININE 1.34*  CALCIUM 8.0*    Liver Function Tests: Recent Labs  Lab 01/04/23 0049  AST 40  ALT 29  ALKPHOS 65  BILITOT 0.7  PROT 5.5*  ALBUMIN 3.1*   No results for input(s): "LIPASE", "AMYLASE" in the last 168 hours. No results for input(s): "AMMONIA" in the last 168 hours.  Cardiac Enzymes: No results for input(s): "CKTOTAL", "CKMB", "CKMBINDEX", "TROPONINI" in the last 168 hours.  BNP (last 3 results) No results for input(s): "BNP" in the last 8760 hours.  ProBNP (last 3 results) No results for input(s): "PROBNP" in the last 8760 hours.  CBG: Recent Labs  Lab 01/04/23 0806  GLUCAP 146*    Lipase     Component Value Date/Time    LIPASE 147 05/21/2013 2121     Urinalysis    Component Value Date/Time   COLORURINE YELLOW 01/04/2023 0049   APPEARANCEUR HAZY (A) 01/04/2023 0049   APPEARANCEUR Clear 05/21/2013 2121   LABSPEC 1.018 01/04/2023 0049   LABSPEC 1.031 05/21/2013 2121   PHURINE 5.0 01/04/2023 0049   GLUCOSEU NEGATIVE 01/04/2023 0049   GLUCOSEU 50 mg/dL 16/11/9602 5409   HGBUR NEGATIVE 01/04/2023 0049   BILIRUBINUR NEGATIVE 01/04/2023 0049   BILIRUBINUR Negative 05/21/2013 2121   KETONESUR NEGATIVE 01/04/2023 0049   PROTEINUR NEGATIVE 01/04/2023 0049   NITRITE NEGATIVE 01/04/2023 0049   LEUKOCYTESUR NEGATIVE 01/04/2023 0049   LEUKOCYTESUR Negative 05/21/2013 2121     Drugs of Abuse  No results found for: "LABOPIA", "COCAINSCRNUR", "LABBENZ", "AMPHETMU", "THCU", "LABBARB"    Radiological Exams on Admission: CT Angio Chest PE W/Cm &/Or Wo Cm  Result Date: 01/04/2023 CLINICAL DATA:  Pulmonary embolism suspected, high probability. Altered mental status, tachypnea. Sepsis. EXAM: CT ANGIOGRAPHY CHEST CT ABDOMEN AND PELVIS WITH CONTRAST TECHNIQUE: Multidetector CT imaging of the chest was performed using the standard protocol during bolus administration of intravenous contrast. Multiplanar CT image reconstructions and MIPs were obtained to evaluate the vascular anatomy. Multidetector CT imaging of the abdomen and pelvis was performed using the standard protocol during bolus administration of intravenous contrast. RADIATION DOSE REDUCTION: This exam was performed according to the departmental dose-optimization program which includes automated exposure control, adjustment of the mA and/or kV according to patient size and/or use of iterative reconstruction technique. CONTRAST:  75mL OMNIPAQUE IOHEXOL 350 MG/ML SOLN COMPARISON:  05/21/2013. FINDINGS: CTA CHEST FINDINGS Cardiovascular: The heart is borderline enlarged and there is no pericardial effusion. Multi-vessel coronary artery calcifications are noted. There is  atherosclerotic calcification of the aorta without evidence of aneurysm. Pulmonary trunk is normal in caliber. There are questionable pulmonary emboli in the left upper and lower lobes. Evaluation is limited due to mixing artifact and respiratory motion. The right ventricle is distended, not significantly changed from 2015, however possibility right heart strain can not be excluded. Mediastinum/Nodes: No enlarged mediastinal, hilar, or axillary lymph nodes. Thyroid gland, trachea, and esophagus demonstrate no significant  findings. There is a small hiatal hernia. Lungs/Pleura: Mild atelectasis is noted bilaterally. No effusion or pneumothorax is seen. Musculoskeletal: Degenerative changes are present in the thoracic spine. No acute osseous abnormality is seen. Review of the MIP images confirms the above findings. CT ABDOMEN and PELVIS FINDINGS Hepatobiliary: No focal liver abnormality is seen. No gallstones, gallbladder wall thickening, or biliary dilatation. Pancreas: Pancreatic atrophy is noted. No pancreatic ductal dilatation or surrounding inflammatory changes. Spleen: Normal in size without focal abnormality. Adrenals/Urinary Tract: The adrenal glands are within normal limits. The kidneys enhance symmetrically. Subcentimeter hypodensities are present in the kidneys bilaterally which are too small to further characterize. No renal calculus or obstructive uropathy. Bladder is unremarkable. Stomach/Bowel: There is a small hiatal hernia. The stomach is otherwise within normal limits. Segmental thickening of the walls of the terminal ileum is noted with multiple dilated loops of small bowel proximal to this segment abdomen measuring up to 4.1 cm. The appendix is not seen and surgical changes are noted at the cecum. No free air or pneumatosis is seen. Vascular/Lymphatic: Aortic atherosclerosis. No enlarged abdominal or pelvic lymph nodes. Reproductive: Prostate gland is mildly enlarged. Other: A small amount of free  fluid is noted in the pelvis. Musculoskeletal: Degenerative changes are present in the lumbar spine. No acute osseous abnormality is seen. Review of the MIP images confirms the above findings. IMPRESSION: 1. Questionable left upper lobe and left lower lobe pulmonary emboli. Examination is limited due to mixing artifact and respiratory motion. The right ventricle is mildly distended, which is present on a previous exam, however the possibility of right heart strain can not be excluded. Consider repeat evaluation or V/Q scan for confirmation. 2. Segmental thickening of the walls of the terminal ileum, possible infectious or inflammatory ileitis with dilatation of the small bowel proximal to this segment, resulting in partial or early small bowel obstruction. 3. Small hiatal hernia. 4. Coronary artery calcifications. 5. Aortic atherosclerosis. Electronically Signed   By: Thornell Sartorius M.D.   On: 01/04/2023 04:25   CT ABDOMEN PELVIS W CONTRAST  Result Date: 01/04/2023 CLINICAL DATA:  Pulmonary embolism suspected, high probability. Altered mental status, tachypnea. Sepsis. EXAM: CT ANGIOGRAPHY CHEST CT ABDOMEN AND PELVIS WITH CONTRAST TECHNIQUE: Multidetector CT imaging of the chest was performed using the standard protocol during bolus administration of intravenous contrast. Multiplanar CT image reconstructions and MIPs were obtained to evaluate the vascular anatomy. Multidetector CT imaging of the abdomen and pelvis was performed using the standard protocol during bolus administration of intravenous contrast. RADIATION DOSE REDUCTION: This exam was performed according to the departmental dose-optimization program which includes automated exposure control, adjustment of the mA and/or kV according to patient size and/or use of iterative reconstruction technique. CONTRAST:  75mL OMNIPAQUE IOHEXOL 350 MG/ML SOLN COMPARISON:  05/21/2013. FINDINGS: CTA CHEST FINDINGS Cardiovascular: The heart is borderline enlarged and  there is no pericardial effusion. Multi-vessel coronary artery calcifications are noted. There is atherosclerotic calcification of the aorta without evidence of aneurysm. Pulmonary trunk is normal in caliber. There are questionable pulmonary emboli in the left upper and lower lobes. Evaluation is limited due to mixing artifact and respiratory motion. The right ventricle is distended, not significantly changed from 2015, however possibility right heart strain can not be excluded. Mediastinum/Nodes: No enlarged mediastinal, hilar, or axillary lymph nodes. Thyroid gland, trachea, and esophagus demonstrate no significant findings. There is a small hiatal hernia. Lungs/Pleura: Mild atelectasis is noted bilaterally. No effusion or pneumothorax is seen. Musculoskeletal: Degenerative changes are present  in the thoracic spine. No acute osseous abnormality is seen. Review of the MIP images confirms the above findings. CT ABDOMEN and PELVIS FINDINGS Hepatobiliary: No focal liver abnormality is seen. No gallstones, gallbladder wall thickening, or biliary dilatation. Pancreas: Pancreatic atrophy is noted. No pancreatic ductal dilatation or surrounding inflammatory changes. Spleen: Normal in size without focal abnormality. Adrenals/Urinary Tract: The adrenal glands are within normal limits. The kidneys enhance symmetrically. Subcentimeter hypodensities are present in the kidneys bilaterally which are too small to further characterize. No renal calculus or obstructive uropathy. Bladder is unremarkable. Stomach/Bowel: There is a small hiatal hernia. The stomach is otherwise within normal limits. Segmental thickening of the walls of the terminal ileum is noted with multiple dilated loops of small bowel proximal to this segment abdomen measuring up to 4.1 cm. The appendix is not seen and surgical changes are noted at the cecum. No free air or pneumatosis is seen. Vascular/Lymphatic: Aortic atherosclerosis. No enlarged abdominal or  pelvic lymph nodes. Reproductive: Prostate gland is mildly enlarged. Other: A small amount of free fluid is noted in the pelvis. Musculoskeletal: Degenerative changes are present in the lumbar spine. No acute osseous abnormality is seen. Review of the MIP images confirms the above findings. IMPRESSION: 1. Questionable left upper lobe and left lower lobe pulmonary emboli. Examination is limited due to mixing artifact and respiratory motion. The right ventricle is mildly distended, which is present on a previous exam, however the possibility of right heart strain can not be excluded. Consider repeat evaluation or V/Q scan for confirmation. 2. Segmental thickening of the walls of the terminal ileum, possible infectious or inflammatory ileitis with dilatation of the small bowel proximal to this segment, resulting in partial or early small bowel obstruction. 3. Small hiatal hernia. 4. Coronary artery calcifications. 5. Aortic atherosclerosis. Electronically Signed   By: Thornell Sartorius M.D.   On: 01/04/2023 04:25   CT Head Wo Contrast  Result Date: 01/04/2023 CLINICAL DATA:  Altered mental status EXAM: CT HEAD WITHOUT CONTRAST TECHNIQUE: Contiguous axial images were obtained from the base of the skull through the vertex without intravenous contrast. RADIATION DOSE REDUCTION: This exam was performed according to the departmental dose-optimization program which includes automated exposure control, adjustment of the mA and/or kV according to patient size and/or use of iterative reconstruction technique. COMPARISON:  None Available. 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 No evidence of acute intracranial hemorrhage or infarct. No abnormal mass effect or midline shift. No abnormal intra or extra-axial mass lesion. Ventricular size is normal. Vascular: No hyperdense vessel or unexpected calcification. Skull: No  acute fracture Sinuses/Orbits: Paranasal sinuses are clear. Right ocular lens has been removed. Orbits are otherwise unremarkable. Other: Mastoid air cells and middle ear cavities are clear. IMPRESSION: 1. No acute intracranial abnormality. 2. 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 Electronically Signed   By: Helyn Numbers M.D.   On: 01/04/2023 01:44   DG Chest Port 1 View  Result Date: 01/04/2023 CLINICAL DATA:  Syncope EXAM: PORTABLE CHEST 1 VIEW COMPARISON:  05/22/2013 FINDINGS: Lungs are clear.  No pleural effusion or pneumothorax. The heart is normal in size.  Thoracic aortic atherosclerosis. IMPRESSION: No acute cardiopulmonary disease. Electronically Signed   By: Charline Bills M.D.   On: 01/04/2023 01:25     Signed, Lorin Glass, MD [facility] 01/04/2023

## 2023-01-04 NOTE — ED Notes (Signed)
Phlebotomy at bedside.

## 2023-01-04 NOTE — ED Notes (Signed)
Pt is declining and less responsive, MD notified.

## 2023-01-04 NOTE — ED Notes (Signed)
MD notified about troponin.

## 2023-01-04 NOTE — ED Provider Notes (Signed)
[place] EMERGENCY DEPARTMENT AT [facility] Provider Note   CSN: [id number] Arrival date & time: 01/04/23  0038     History  Chief Complaint  Patient presents with   Altered Mental Status    Patient to ED via EMS with complaint of altered mental status x 2 hours. EMS reports patient was warm to touch, with tachypnea  on their arrival with a room air saturation of 89%. EMS gave 300 mL of normal saline and nasal canula O2 3 liters. EMS reports patient appeared more comfortable after and reported penile pain.    SONYA PUCCI is a 82 y.o. male.  The history is provided by the patient, the EMS personnel, medical records and the spouse.  Altered Mental Status CHASKE PASKETT is a 82 y.o. male who presents to the Emergency Department complaining of altered mental status.  Patient presents the emergency department by EMS for evaluation of for altered mental status that started 2 hours prior to ED arrival.  EMS reported that he was warm to the touch and tachypneic with sats of 89% on room air.  He had a temperature to 99.2 for EMS.  He was treated with 300 cc of normal saline and placed on nasal cannula oxygen.  He reported penile pain to EMS.  At time of ED arrival patient states that his trigeminal neuralgia is acting up and he has right-sided facial pain.  He also reports that he passed out earlier in the day.  He denies any fevers, chest pain, difficulty breathing, abdominal pain, dysuria, penile pain, numbness, weakness.   He has a history of diabetes, hypertension.  Additional history available from patient's wife at the bedside after patient's initial ED presentation.   Per wife he saw primary care earlier today due to reports of stomach cramping and bilateral leg cramping.  She states that he has not felt well for about a week.  Tonight he had a large chill and started not making sense, which prompted EMS call.   Home Medications Prior to Admission medications   Medication  Sig Start Date End Date Taking? Authorizing Provider  acetaminophen (TYLENOL) 500 MG tablet Take 1,000 mg by mouth every 6 (six) hours as needed.   Yes [provider]  amLODipine (NORVASC) 5 MG tablet Take 1 tablet by mouth daily. 05/23/22  Yes [provider]  Apoaequorin 10 MG CAPS Take 1 capsule by mouth daily. Prevagen   Yes [provider]  aspirin EC 81 MG tablet Take 325 mg by mouth daily.   Yes [provider]  carbamazepine (CARBATROL) 200 MG 12 hr capsule Take 200 mg by mouth 2 (two) times daily.   Yes [provider]  cholestyramine light (PREVALITE) 4 GM/DOSE powder Take 4 g by mouth 2 (two) times daily with a meal. 12/01/22  Yes [provider]  gabapentin (NEURONTIN) 600 MG tablet Take 600 mg by mouth 3 (three) times daily. 12/19/22  Yes [provider]  glucagon (GLUCAGON EMERGENCY) 1 MG injection Inject 1 mg into the skin. 06/18/13  Yes [provider]  insulin lispro (HUMALOG) 100 UNIT/ML injection Inject 20 Units into the skin once. Inject 20 units once per day; can bolus up to 56 units once a day as directed via VGO Pump   Yes [provider]  losartan (COZAAR) 50 MG tablet Take 50 mg daily by mouth.   Yes [provider]  Multiple Vitamin (MULTI-VITAMIN) tablet Take 1 tablet by mouth daily.  Yes [provider]  omeprazole (PRILOSEC) 20 MG capsule Take 20 mg by mouth daily.   Yes [provider]  rosuvastatin (CRESTOR) 10 MG tablet Take 1 tablet by mouth daily. 11/24/22  Yes [provider]      Allergies    Patient has no known allergies.    Review of Systems   Review of Systems  All other systems reviewed and are negative.   Physical Exam Updated Vital Signs BP (!) 116/104   Pulse (!) 118   Temp 98.7 F (37.1 C) (Rectal)   Resp 20   Ht 5\' 6"  (1.676 m)   Wt 84 kg   SpO2 96%   BMI 29.89 kg/m  Physical Exam Vitals and nursing note reviewed.   Constitutional:      Appearance: He is well-developed.  HENT:     Head: Normocephalic and atraumatic.  Cardiovascular:     Rate and Rhythm: Normal rate and regular rhythm.     Heart sounds: No murmur heard. Pulmonary:     Effort: Pulmonary effort is normal. No respiratory distress.     Breath sounds: Normal breath sounds.  Abdominal:     Palpations: Abdomen is soft.     Tenderness: There is no abdominal tenderness. There is no guarding or rebound.  Genitourinary:    Penis: Normal.      Testes: Normal.  Musculoskeletal:        General: No swelling or tenderness.     Comments: 2+ DP pulses bilaterally.  Skin:    General: Skin is warm and dry.  Neurological:     Mental Status: He is alert and oriented to person, place, and time.     Comments: 5 out of 5 strength in all 4 extremities with sensation to light touch intact in all 4 extremities.  Visual fields grossly intact  Psychiatric:        Behavior: Behavior normal.     ED Results / Procedures / Treatments   Labs (all labs ordered are listed, but only abnormal results are displayed) Labs Reviewed  COMPREHENSIVE METABOLIC PANEL - Abnormal; Notable for the following components:      Result Value   Sodium 132 (*)    CO2 19 (*)    Glucose, Bld 182 (*)    Creatinine, Ser 1.34 (*)    Calcium 8.0 (*)    Total Protein 5.5 (*)    Albumin 3.1 (*)    GFR, Estimated 54 (*)    All other components within normal limits  CBC WITH DIFFERENTIAL/PLATELET - Abnormal; Notable for the following components:   RBC 3.75 (*)    Hemoglobin 11.7 (*)    HCT 35.1 (*)    Lymphs Abs 0.2 (*)    All other components within normal limits  URINALYSIS, W/ REFLEX TO CULTURE (INFECTION SUSPECTED) - Abnormal; Notable for the following components:   APPearance HAZY (*)    All other components within normal limits  I-STAT CG4 LACTIC ACID, ED - Abnormal; Notable for the following components:   Lactic Acid, Venous 2.0 (*)    All other components within  normal limits  TROPONIN I (HIGH SENSITIVITY) - Abnormal; Notable for the following components:   Troponin I (High Sensitivity) 47 (*)    All other components within normal limits  TROPONIN I (HIGH SENSITIVITY) - Abnormal; Notable for the following components:   Troponin I (High Sensitivity) 77 (*)    All other components within normal limits  SARS CORONAVIRUS 2 BY RT  PCR  CULTURE, BLOOD (ROUTINE X 2)  CULTURE, BLOOD (ROUTINE X 2)  PROTIME-INR  APTT  HEPARIN LEVEL (UNFRACTIONATED)  I-STAT CG4 LACTIC ACID, ED    EKG EKG Interpretation Date/Time:  Wednesday January 04 2023 00:46:58 EST Ventricular Rate:  102 PR Interval:  154 QRS Duration:  91 QT Interval:  333 QTC Calculation: 434 R Axis:   61  Text Interpretation: Sinus tachycardia Borderline T wave abnormalities Confirmed by Tilden Fossa [phone]) on 01/04/2023 1:17:14 AM  Radiology CT Angio Chest PE W/Cm &/Or Wo Cm  Result Date: 01/04/2023 CLINICAL DATA:  Pulmonary embolism suspected, high probability. Altered mental status, tachypnea. Sepsis. EXAM: CT ANGIOGRAPHY CHEST CT ABDOMEN AND PELVIS WITH CONTRAST TECHNIQUE: Multidetector CT imaging of the chest was performed using the standard protocol during bolus administration of intravenous contrast. Multiplanar CT image reconstructions and MIPs were obtained to evaluate the vascular anatomy. Multidetector CT imaging of the abdomen and pelvis was performed using the standard protocol during bolus administration of intravenous contrast. RADIATION DOSE REDUCTION: This exam was performed according to the departmental dose-optimization program which includes automated exposure control, adjustment of the mA and/or kV according to patient size and/or use of iterative reconstruction technique. CONTRAST:  75mL OMNIPAQUE IOHEXOL 350 MG/ML SOLN COMPARISON:  05/21/2013. FINDINGS: CTA CHEST FINDINGS Cardiovascular: The heart is borderline enlarged and there is no pericardial effusion. Multi-vessel  coronary artery calcifications are noted. There is atherosclerotic calcification of the aorta without evidence of aneurysm. Pulmonary trunk is normal in caliber. There are questionable pulmonary emboli in the left upper and lower lobes. Evaluation is limited due to mixing artifact and respiratory motion. The right ventricle is distended, not significantly changed from 2015, however possibility right heart strain can not be excluded. Mediastinum/Nodes: No enlarged mediastinal, hilar, or axillary lymph nodes. Thyroid gland, trachea, and esophagus demonstrate no significant findings. There is a small hiatal hernia. Lungs/Pleura: Mild atelectasis is noted bilaterally. No effusion or pneumothorax is seen. Musculoskeletal: Degenerative changes are present in the thoracic spine. No acute osseous abnormality is seen. Review of the MIP images confirms the above findings. CT ABDOMEN and PELVIS FINDINGS Hepatobiliary: No focal liver abnormality is seen. No gallstones, gallbladder wall thickening, or biliary dilatation. Pancreas: Pancreatic atrophy is noted. No pancreatic ductal dilatation or surrounding inflammatory changes. Spleen: Normal in size without focal abnormality. Adrenals/Urinary Tract: The adrenal glands are within normal limits. The kidneys enhance symmetrically. Subcentimeter hypodensities are present in the kidneys bilaterally which are too small to further characterize. No renal calculus or obstructive uropathy. Bladder is unremarkable. Stomach/Bowel: There is a small hiatal hernia. The stomach is otherwise within normal limits. Segmental thickening of the walls of the terminal ileum is noted with multiple dilated loops of small bowel proximal to this segment abdomen measuring up to 4.1 cm. The appendix is not seen and surgical changes are noted at the cecum. No free air or pneumatosis is seen. Vascular/Lymphatic: Aortic atherosclerosis. No enlarged abdominal or pelvic lymph nodes. Reproductive: Prostate gland  is mildly enlarged. Other: A small amount of free fluid is noted in the pelvis. Musculoskeletal: Degenerative changes are present in the lumbar spine. No acute osseous abnormality is seen. Review of the MIP images confirms the above findings. IMPRESSION: 1. Questionable left upper lobe and left lower lobe pulmonary emboli. Examination is limited due to mixing artifact and respiratory motion. The right ventricle is mildly distended, which is present on a previous exam, however the possibility of right heart strain can not be excluded. Consider repeat evaluation or  V/Q scan for confirmation. 2. Segmental thickening of the walls of the terminal ileum, possible infectious or inflammatory ileitis with dilatation of the small bowel proximal to this segment, resulting in partial or early small bowel obstruction. 3. Small hiatal hernia. 4. Coronary artery calcifications. 5. Aortic atherosclerosis. Electronically Signed   By: Thornell Sartorius M.D.   On: 01/04/2023 04:25   CT ABDOMEN PELVIS W CONTRAST  Result Date: 01/04/2023 CLINICAL DATA:  Pulmonary embolism suspected, high probability. Altered mental status, tachypnea. Sepsis. EXAM: CT ANGIOGRAPHY CHEST CT ABDOMEN AND PELVIS WITH CONTRAST TECHNIQUE: Multidetector CT imaging of the chest was performed using the standard protocol during bolus administration of intravenous contrast. Multiplanar CT image reconstructions and MIPs were obtained to evaluate the vascular anatomy. Multidetector CT imaging of the abdomen and pelvis was performed using the standard protocol during bolus administration of intravenous contrast. RADIATION DOSE REDUCTION: This exam was performed according to the departmental dose-optimization program which includes automated exposure control, adjustment of the mA and/or kV according to patient size and/or use of iterative reconstruction technique. CONTRAST:  75mL OMNIPAQUE IOHEXOL 350 MG/ML SOLN COMPARISON:  05/21/2013. FINDINGS: CTA CHEST FINDINGS  Cardiovascular: The heart is borderline enlarged and there is no pericardial effusion. Multi-vessel coronary artery calcifications are noted. There is atherosclerotic calcification of the aorta without evidence of aneurysm. Pulmonary trunk is normal in caliber. There are questionable pulmonary emboli in the left upper and lower lobes. Evaluation is limited due to mixing artifact and respiratory motion. The right ventricle is distended, not significantly changed from 2015, however possibility right heart strain can not be excluded. Mediastinum/Nodes: No enlarged mediastinal, hilar, or axillary lymph nodes. Thyroid gland, trachea, and esophagus demonstrate no significant findings. There is a small hiatal hernia. Lungs/Pleura: Mild atelectasis is noted bilaterally. No effusion or pneumothorax is seen. Musculoskeletal: Degenerative changes are present in the thoracic spine. No acute osseous abnormality is seen. Review of the MIP images confirms the above findings. CT ABDOMEN and PELVIS FINDINGS Hepatobiliary: No focal liver abnormality is seen. No gallstones, gallbladder wall thickening, or biliary dilatation. Pancreas: Pancreatic atrophy is noted. No pancreatic ductal dilatation or surrounding inflammatory changes. Spleen: Normal in size without focal abnormality. Adrenals/Urinary Tract: The adrenal glands are within normal limits. The kidneys enhance symmetrically. Subcentimeter hypodensities are present in the kidneys bilaterally which are too small to further characterize. No renal calculus or obstructive uropathy. Bladder is unremarkable. Stomach/Bowel: There is a small hiatal hernia. The stomach is otherwise within normal limits. Segmental thickening of the walls of the terminal ileum is noted with multiple dilated loops of small bowel proximal to this segment abdomen measuring up to 4.1 cm. The appendix is not seen and surgical changes are noted at the cecum. No free air or pneumatosis is seen.  Vascular/Lymphatic: Aortic atherosclerosis. No enlarged abdominal or pelvic lymph nodes. Reproductive: Prostate gland is mildly enlarged. Other: A small amount of free fluid is noted in the pelvis. Musculoskeletal: Degenerative changes are present in the lumbar spine. No acute osseous abnormality is seen. Review of the MIP images confirms the above findings. IMPRESSION: 1. Questionable left upper lobe and left lower lobe pulmonary emboli. Examination is limited due to mixing artifact and respiratory motion. The right ventricle is mildly distended, which is present on a previous exam, however the possibility of right heart strain can not be excluded. Consider repeat evaluation or V/Q scan for confirmation. 2. Segmental thickening of the walls of the terminal ileum, possible infectious or inflammatory ileitis with dilatation of the small  bowel proximal to this segment, resulting in partial or early small bowel obstruction. 3. Small hiatal hernia. 4. Coronary artery calcifications. 5. Aortic atherosclerosis. Electronically Signed   By: Thornell Sartorius M.D.   On: 01/04/2023 04:25   CT Head Wo Contrast  Result Date: 01/04/2023 CLINICAL DATA:  Altered mental status EXAM: CT HEAD WITHOUT CONTRAST TECHNIQUE: Contiguous axial images were obtained from the base of the skull through the vertex without intravenous contrast. RADIATION DOSE REDUCTION: This exam was performed according to the departmental dose-optimization program which includes automated exposure control, adjustment of the mA and/or kV according to patient size and/or use of iterative reconstruction technique. COMPARISON:  None Available. 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 No evidence of acute intracranial hemorrhage or infarct. No abnormal mass effect or midline shift. No abnormal intra or extra-axial mass lesion. Ventricular size is normal.  Vascular: No hyperdense vessel or unexpected calcification. Skull: No acute fracture Sinuses/Orbits: Paranasal sinuses are clear. Right ocular lens has been removed. Orbits are otherwise unremarkable. Other: Mastoid air cells and middle ear cavities are clear. IMPRESSION: 1. No acute intracranial abnormality. 2. 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 Electronically Signed   By: Helyn Numbers M.D.   On: 01/04/2023 01:44   DG Chest Port 1 View  Result Date: 01/04/2023 CLINICAL DATA:  Syncope EXAM: PORTABLE CHEST 1 VIEW COMPARISON:  05/22/2013 FINDINGS: Lungs are clear.  No pleural effusion or pneumothorax. The heart is normal in size.  Thoracic aortic atherosclerosis. IMPRESSION: No acute cardiopulmonary disease. Electronically Signed   By: Charline Bills M.D.   On: 01/04/2023 01:25    Procedures Procedures   CRITICAL CARE Performed by: Tilden Fossa   Total critical care time: 35 minutes  Critical care time was exclusive of separately billable procedures and treating other patients.  Critical care was necessary to treat or prevent imminent or life-threatening deterioration.  Critical care was time spent personally by me on the following activities: development of treatment plan with patient and/or surrogate as well as nursing, discussions with consultants, evaluation of patient's response to treatment, examination of patient, obtaining history from patient or surrogate, ordering and performing treatments and interventions, ordering and review of laboratory studies, ordering and review of radiographic studies, pulse oximetry and re-evaluation of patient's condition.  Medications Ordered in ED Medications  heparin ADULT infusion 100 units/mL (25000 units/228mL) (1,100 Units/hr Intravenous New Bag/Given 01/04/23 0514)  acetaminophen (TYLENOL) tablet 650 mg (has no administration in time range)  fentaNYL (SUBLIMAZE)  injection 12.5-50 mcg (has no administration in time range)  sodium chloride 0.9 % bolus 500 mL (0 mLs Intravenous Stopped 01/04/23 0229)  acetaminophen (TYLENOL) tablet 650 mg (650 mg Oral Given 01/04/23 0224)  cefTRIAXone (ROCEPHIN) 1 g in sodium chloride 0.9 % 100 mL IVPB (0 g Intravenous Stopped 01/04/23 0249)  iohexol (OMNIPAQUE) 350 MG/ML injection 75 mL (75 mLs Intravenous Contrast Given 01/04/23 0338)  heparin bolus via infusion 5,000 Units (5,000 Units Intravenous Bolus from Bag 01/04/23 0514)  lactated ringers bolus 1,000 mL (1,000 mLs Intravenous New Bag/Given 01/04/23 0534)    ED Course/ Medical Decision Making/ A&P                                 Medical Decision Making Amount and/or Complexity of Data Reviewed Labs: ordered. Radiology: ordered.  Risk OTC drugs.  Prescription drug management. Decision regarding hospitalization.   Patient with history of hypertension, trigeminal neuralgia, diabetes here for evaluation of altered mental status.  At time of ED arrival patient denies any pain, is oriented to person place and time.  He does not have focal deficits.  He was found to be febrile.  CT head is negative for acute abnormality.  Chest x-ray is negative for pneumonia.  Given reported penile pain by EMS he was treated empirically with antibiotics for possible UTI pending workup.  EMS does report hypoxia prior to ED arrival, none in the emergency department.  Troponins are mildly elevated, uptrending.  Patient does not have any chest pain.  Given his hypoxia, rising troponin a CTA PE study was obtained, given wife's report for abdominal pain prior to arrival and vital signs concerning for sepsis a CT abdomen pelvis was obtained.  Imaging is significant for possible PE with possible heart strain, no evidence of pneumonia.  Patient with also possible early bowel obstruction.  No evidence of enteritis or bowel obstruction clinically at this time.  Will start on heparin for possible  pulmonary embolism.  Patient and wife updated Darlis Loan studies and recommendation for admission and they are in agreement with treatment plan.        Final Clinical Impression(s) / ED Diagnoses Final diagnoses:  Elevated troponin  SIRS (systemic inflammatory response syndrome) (HCC)    Rx / DC Orders ED Discharge Orders     None         Tilden Fossa, MD 01/04/23 [phone]

## 2023-01-04 NOTE — ED Notes (Signed)
MD notified about temperature.

## 2023-01-04 NOTE — ED Notes (Signed)
RN expressed concerns to MD about pt declining. MD states ICU is on their way to room .

## 2023-01-04 NOTE — Progress Notes (Addendum)
Pharmacy Antibiotic Note  Larry Lane is a 82 y.o. male admitted on 01/04/2023 with meningitis.  Pharmacy has been consulted for vanc/acyclovir/ceftriaxone/ampicillin-sulbactam dosing. Patient BMI is borderline at 29.82, unknown baseline creatinine but on upper limit of normal at 1.34. WBC within normal limits 8.0, T max 103.1.   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    No Known Allergies  Antimicrobials this admission: Piperacillin-tazobactam 11/6 x1  Microbiology results: 11/6 BCx:  11/6 MRSA PCR:  11/6 CSF:   Thank you for allowing pharmacy to be a part of this patient's care.  Rutherford Nail, PharmD PGY2 Critical Care Pharmacy Resident 01/04/2023 5:06 PM  Addendum: Ampicillin-sulbactam adjusted to ampicillin 2g q6h

## 2023-01-04 NOTE — H&P (Signed)
NAME:  Larry Lane, MRN:  [id number], DOB:  1940-09-13, LOS: 0 ADMISSION DATE:  01/04/2023, CONSULTATION DATE:  01/04/2023 REFERRING MD:  Dahal-TRH, CHIEF COMPLAINT:  hypotension   History of Present Illness:  Larry Lane is a 82 y/o gentleman with a history of trigeminal neuralgia, HTN who presented with episodes of confusion at home and rigors last night. He presented to the ED and was started on empiric ceftriaxone for unknown infection. He underwent LP today for rule out of encephalitis and meningitis after having a prolonged fever ~103 degrees today. Antibiotics were escalated with his fever. He has remained confused today. After his LP his BP has been lower with MAP 55-60. PCCM was consulted. He did not receive his home antihypertensives this morning.   Yesterday he had an appointment with his doctor about trigmeninal neuralgia, and the only med change made was increased carbamazepime from 3 to 4 times daily. Gabapentin dose remained the same.   Pertinent  Medical History  HTN Trigeminal neuralgia  Significant Hospital Events: Including procedures, antibiotic start and stop dates in addition to other pertinent events   11/6 admitted, fevers, antibiotics esclated. LP done, peripheral pressors started after.   Interim History / Subjective:    Objective   Blood pressure (!) 105/49, pulse 82, temperature 98.3 F (36.8 C), temperature source Oral, resp. rate 16, height 5\' 6"  (1.676 m), weight 83.8 kg, SpO2 94%.        Intake/Output Summary (Last 24 hours) at 01/04/2023 1807 Last data filed at 01/04/2023 1800 Gross per 24 hour  Intake 3996.2 ml  Output --  Net 3996.2 ml   Filed Weights   01/04/23 0042 01/04/23 1745  Weight: 84 kg 83.8 kg    Examination: General: critically ill appearing man lying in bed in NAD, sleeping HENT: [place]/AT, eyes anicteric. Mouth very dry. Lungs: breathing comfortably on RA, mild tachypnea, but no accessory muscle use. Laying flat after  LP. Cardiovascular: S1S2, RRR Abdomen: soft, NT Extremities: no edema, no cyanosis Neuro: sleepy but arousable, dysarthric speech but oriented to person and being in [place]. Moving all extremities on command but falls back asleep quickly GU: external foley with amber urine  Trop 77> 3448 LA 2>  1.7  Bicarb 19 BUN 21 Cr 1.34 AST 40 ALT 29 Bili 0.7 WBC 8 H/H 11.7/35.1 Blood cx NGTD CXR personally reviewed> no infiltrates CTA chest: no infiltrates, possible subsegmental PE  UA: 0-5 WBC  EKG: NSR, normal axis. mild ST depressions with TWI in inferolateral leads, Q waves II, III< AVF. Normal intervals.   Resolved Hospital Problem list     Assessment & Plan:  Septic shock- unknown source, concern for possible meningitis. Does not appear to be pulmonary or urinary in origin. No abdominal tenderness or LFT abnormalities. No visible rashes. This degree of encephalopathy would not be from cardiac pathology almost 24 hrs before hypotension sets in.  -agree with broadened antimicrobials-- vanc, ceftriaxone- high dose q12, ampicillin, acyclovir -getting additional IVF -vasopressors as needed to maintain MAP >65 -PICC line  Acute metabolic encephalopathy -protecting his airway, but monitor in ICU -avoid sedating meds, NPO for now -holding gabapentin and carbamazepine until more awake and able to take pills  Troponin elevation, worry about mild ST depressions inferolaterally, but has Q waves there Possible PE -agree with heparin -can start statin once he can take PO -echo -repeat EKG -repeat trop in AM  AKI -maintain adequate perfusion with MAP >65 -strict I/O -agree with IVF boluses -renally dose meds,  avoid nephrotoxic meds  Hypovolemic hyponatremia -avoid hypotonic fluids -monitor  Hyperglycemia, history of DM. A1c 7.6 -SSI PRN -hold PTA insulin regimen  H/o HTN -hold PTA losartan and amlodipine  Anemia -monitor for bleeding -transfuse for Hb <7 or  hemodynamically significant bleeding  Wife updated at bedside. Confirmed full code. He has an advanced directive, but not with her.   Best Practice (right click and "Reselect all SmartList Selections" daily)   Diet/type: NPO DVT prophylaxis: systemic heparin GI prophylaxis: N/A Lines: N/A Foley:  N/A Code Status:  full code Last date of multidisciplinary goals of care discussion [11/6]  Labs   CBC: Recent Labs  Lab 01/04/23 0049  WBC 8.0  NEUTROABS 7.6  HGB 11.7*  HCT 35.1*  MCV 93.6  PLT 271    Basic Metabolic Panel: Recent Labs  Lab 01/04/23 0049  NA 132*  K 3.9  CL 103  CO2 19*  GLUCOSE 182*  BUN 21  CREATININE 1.34*  CALCIUM 8.0*   GFR: Estimated Creatinine Clearance: 45.4 mL/min (A) (by C-G formula based on SCr of 1.34 mg/dL (H)). Recent Labs  Lab 01/04/23 0049 01/04/23 0119 01/04/23 0307 01/04/23 1352  WBC 8.0  --   --   --   LATICACIDVEN  --  1.6 2.0* 1.7    Liver Function Tests: Recent Labs  Lab 01/04/23 0049  AST 40  ALT 29  ALKPHOS 65  BILITOT 0.7  PROT 5.5*  ALBUMIN 3.1*   No results for input(s): "LIPASE", "AMYLASE" in the last 168 hours. No results for input(s): "AMMONIA" in the last 168 hours.  ABG No results found for: "PHART", "PCO2ART", "PO2ART", "HCO3", "TCO2", "ACIDBASEDEF", "O2SAT"   Coagulation Profile: Recent Labs  Lab 01/04/23 0101  INR 1.0    Cardiac Enzymes: No results for input(s): "CKTOTAL", "CKMB", "CKMBINDEX", "TROPONINI" in the last 168 hours.  HbA1C: Hgb A1c MFr Bld  Date/Time Value Ref Range Status  01/04/2023 01:03 AM 7.6 (H) 4.8 - 5.6 % Final    Comment:    (NOTE) Pre diabetes:          5.7%-6.4%  Diabetes:              >6.4%  Glycemic control for   <7.0% adults with diabetes     CBG: Recent Labs  Lab 01/04/23 0806 01/04/23 1113 01/04/23 1417 01/04/23 1635  GLUCAP 146* 119* 131* 127*    Review of Systems:   Unable to be obtained due to mental status.  Past Medical History:   He,  has a past medical history of Abscess of back, Arthritis, Bacterial infection (2015), Cancer (HCC), Diabetes mellitus without complication (HCC), Dysrhythmia, Edema, GERD (gastroesophageal reflux disease), Hypertension, Trigeminal nerve disorder, and Trigeminal neuralgia.   Surgical History:   Past Surgical History:  Procedure Laterality Date   APPENDECTOMY  2010   CATARACT EXTRACTION W/PHACO Right 01/26/2017   Procedure: CATARACT EXTRACTION PHACO AND INTRAOCULAR LENS PLACEMENT (IOC);  Surgeon: Nevada Crane, MD;  Location: [facility] ORS;  Service: Ophthalmology;  Laterality: Right;  Lot # [id number] H [place]: 00:48.2 AP%: 9.9 CDE: 5.35   I&D of back abscess     TRIGEMINAL NERVE DECOMPRESSION       Social History:   reports that he has never smoked. He has never used smokeless tobacco. He reports that he does not drink alcohol and does not use drugs.   Family History:  His family history includes Heart disease in his father.   Allergies No Known Allergies   Home Medications  Prior to Admission medications   Medication Sig Start Date End Date Taking? Authorizing Provider  acetaminophen (TYLENOL) 500 MG tablet Take 1,000 mg by mouth every 6 (six) hours as needed.   Yes [provider]  amLODipine (NORVASC) 5 MG tablet Take 1 tablet by mouth daily. 05/23/22  Yes [provider]  Apoaequorin 10 MG CAPS Take 1 capsule by mouth daily. Prevagen   Yes [provider]  aspirin EC 81 MG tablet Take 325 mg by mouth daily.   Yes [provider]  carbamazepine (CARBATROL) 200 MG 12 hr capsule Take 200 mg by mouth 4 (four) times daily.   Yes [provider]  cholestyramine light (PREVALITE) 4 GM/DOSE powder Take 4 g by mouth 2 (two) times daily with a meal. 12/01/22  Yes [provider]  gabapentin (NEURONTIN) 600 MG tablet Take 600 mg by mouth 3 (three) times daily. 12/19/22  Yes [provider]  glucagon (GLUCAGON EMERGENCY) 1 MG  injection Inject 1 mg into the skin. 06/18/13  Yes [provider]  insulin lispro (HUMALOG) 100 UNIT/ML injection Inject 20 Units into the skin once. Inject 20 units once per day; can bolus up to 56 units once a day as directed via VGO Pump   Yes [provider]  losartan (COZAAR) 50 MG tablet Take 50 mg daily by mouth.   Yes [provider]  Multiple Vitamin (MULTI-VITAMIN) tablet Take 1 tablet by mouth daily.   Yes [provider]  omeprazole (PRILOSEC) 20 MG capsule Take 20 mg by mouth daily.   Yes [provider]  rosuvastatin (CRESTOR) 10 MG tablet Take 1 tablet by mouth daily. 11/24/22  Yes [provider]     Critical care time: 45 min.    Steffanie Dunn, DO 01/04/23 6:59 PM  Pulmonary & Critical Care  For contact information, see Amion. If no response to pager, please call PCCM consult pager. After hours, 7PM- 7AM, please call Elink.

## 2023-01-04 NOTE — ED Notes (Signed)
Wife at bedside screaming to help pt. RN went into pts room and pt was shaking. VS stable and RN stated she will reach out to doctor. MD called RN, no further orders at this time. Care resumed.

## 2023-01-04 NOTE — ED Notes (Signed)
RN expressed concern to MD about pts mental status declining and that RN does not feel comfortable giving oral meds at this time. No further orders placed at this time.

## 2023-01-04 NOTE — Progress Notes (Incomplete)
PHARMACY - ANTICOAGULATION CONSULT NOTE  Pharmacy Consult for Heparin  Indication:  rule out pulmonary embolus  No Known Allergies  Patient Measurements: Height: 5\' 6"  (167.6 cm) Weight: 83.8 kg (184 lb 11.9 oz) IBW/kg (Calculated) : 63.8  Vital Signs: Temp: 98.9 F (37.2 C) (11/06 1945) Temp Source: Oral (11/06 1945) BP: 111/54 (11/06 2230) Pulse Rate: 73 (11/06 2230)  Labs: Recent Labs    01/04/23 0049 01/04/23 0101 01/04/23 0240 01/04/23 1352 01/04/23 2228  HGB 11.7*  --   --   --   --   HCT 35.1*  --   --   --   --   PLT 271  --   --   --   --   APTT  --  27  --   --   --   LABPROT  --  13.4  --   --   --   INR  --  1.0  --   --   --   HEPARINUNFRC  --   --   --  0.27* 0.84*  CREATININE 1.34*  --   --   --   --   TROPONINIHS 47*  --  77* 3,448*  --     Estimated Creatinine Clearance: 45.4 mL/min (A) (by C-G formula based on SCr of 1.34 mg/dL (H)).   Medical History: Past Medical History:  Diagnosis Date   Abscess of back    Arthritis    Bacterial infection 2015   Cancer (HCC)    skin    Diabetes mellitus without complication (HCC)    Dysrhythmia    Edema    RIGHT ANKLE   GERD (gastroesophageal reflux disease)    Hypertension    Trigeminal nerve disorder    Trigeminal neuralgia      Assessment:  82 y/o M with altered mental status, fever, tachypnea. CT anigo with possible PE, recommended considering VQ scan. Starting heparin for now. Above labs reviewed. PTA meds reviewed.   Heparin level supratherapeutic: 0.84, on 1250 units/hr. No issues with infusion or overt s/sx of bleeding. Drawn appropriately  Goal of Therapy:  Heparin level 0.3-0.7 units/ml Monitor platelets by anticoagulation protocol: Yes   Plan:  Decrease heparin drip to 1150 units/hr 8h heparin level Daily CBC/Heparin level Monitor for bleeding  Arabella Merles, PharmD. Clinical Pharmacist 01/04/2023 11:01 PM

## 2023-01-04 NOTE — ED Notes (Signed)
Pt saturated in urine and still in underwear and t- shirt. RN and NT cleaned pt, placed condom cath and changed pt into gown. Wife at bedside. New sheets placed at this time.

## 2023-01-04 NOTE — Progress Notes (Addendum)
eLink Physician-Brief Progress Note Patient Name: Larry Lane DOB: 12-07-1940 MRN: [id number]   Date of Service  01/04/2023  HPI/Events of Note  82 y/o gentleman with a history of trigeminal neuralgia, HTN who presented with episodes of confusion at home and rigors last night.   New onset left leg numbness, cannot raise the leg, wiggling toes but new weakness.  Had LP today.  New from baseline.  eICU Interventions  MRI Lumbar spine stat   0230 -MRI reviewed.  No evidence of discitis/osteomyelitis. 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  Majority of symptoms are in the left lower extremity-somewhat inconsistent with exam findings.  If this persists, may benefit from neurosurgical evaluation in the morning.  No obvious hematoma requiring emergent intervention. 7846 -resume home gabapentin for trigeminal neuralgia.  Able to pass swallow study at bedside.  9629 -has new onset fairly severe rigors.  Poor tolerance to p.o., will order rectal acetaminophen as an alternative for fever.  Add on one-time dose of meperidine.  Intervention Category Minor Interventions: Clinical assessment - ordering diagnostic tests  Tamey Wanek 01/04/2023, 11:47 PM

## 2023-01-04 NOTE — Progress Notes (Signed)
PHARMACY - ANTICOAGULATION CONSULT NOTE  Pharmacy Consult for Heparin  Indication:  rule out pulmonary embolus  No Known Allergies  Patient Measurements: Height: 5\' 6"  (167.6 cm) Weight: 84 kg (185 lb 3 oz) IBW/kg (Calculated) : 63.8  Vital Signs: Temp: 98.1 F (36.7 C) (11/06 0507) Temp Source: Axillary (11/06 0507) BP: 99/43 (11/06 0507) Pulse Rate: 79 (11/06 0507)  Labs: Recent Labs    01/04/23 0049 01/04/23 0101 01/04/23 0240  HGB 11.7*  --   --   HCT 35.1*  --   --   PLT 271  --   --   APTT  --  27  --   LABPROT  --  13.4  --   INR  --  1.0  --   CREATININE 1.34*  --   --   TROPONINIHS 47*  --  77*    Estimated Creatinine Clearance: 45.5 mL/min (A) (by C-G formula based on SCr of 1.34 mg/dL (H)).   Medical History: Past Medical History:  Diagnosis Date   Abscess of back    Arthritis    Bacterial infection 2015   Cancer (HCC)    skin    Diabetes mellitus without complication (HCC)    Dysrhythmia    Edema    RIGHT ANKLE   GERD (gastroesophageal reflux disease)    Hypertension    Trigeminal nerve disorder    Trigeminal neuralgia      Assessment: 82 y/o M with altered mental status, fever, tachypnea. CT anigo with possible PE, recommended considering VQ scan. Starting heparin for now. Above labs reviewed. PTA meds reviewed.   Goal of Therapy:  Heparin level 0.3-0.7 units/ml Monitor platelets by anticoagulation protocol: Yes   Plan:  Heparin 5000 units BOLUS Start heparin drip at 1100 units/hr 1300 Heparin level Daily CBC/Heparin level Monitor for bleeding  Abran Duke, PharmD, BCPS Clinical Pharmacist Phone: [phone]

## 2023-01-04 NOTE — Significant Event (Signed)
Patient came back from lumbar puncture with a blood pressure in 70s.  1 more bolus of normal saline ordered ICU consultation called. Met with ICU attending Dr. Chestine Spore at bedside again. Patient to be transferred to ICU

## 2023-01-04 NOTE — Progress Notes (Signed)
Peripherally Inserted Central Catheter Placement  The IV Nurse has discussed with the patient and/or persons authorized to consent for the patient, the purpose of this procedure and the potential benefits and risks involved with this procedure.  The benefits include less needle sticks, lab draws from the catheter, and the patient may be discharged home with the catheter. Risks include, but not limited to, infection, bleeding, blood clot (thrombus formation), and puncture of an artery; nerve damage and irregular heartbeat and possibility to perform a PICC exchange if needed/ordered by physician.  Alternatives to this procedure were also discussed.  Bard Power PICC patient education guide, fact sheet on infection prevention and patient information card has been provided to patient /or left at bedside. PICC placed by Elenore Paddy RN.    PICC Placement Documentation  PICC Triple Lumen 01/04/23 Right Basilic 37 cm 0 cm (Active)  Indication for Insertion or Continuance of Line Vasoactive infusions 01/04/23 2129  Exposed Catheter (cm) 0 cm 01/04/23 2129  Site Assessment Clean, Dry, Intact 01/04/23 2129  Lumen #1 Status Blood return noted;Flushed;Saline locked 01/04/23 2129  Lumen #2 Status Blood return noted;Flushed;Saline locked 01/04/23 2129  Lumen #3 Status Blood return noted;Flushed;Saline locked 01/04/23 2129  Dressing Type Transparent;Securing device 01/04/23 2129  Dressing Status Clean, Dry, Intact;Antimicrobial disc in place 01/04/23 2129  Line Care Connections checked and tightened 01/04/23 2129  Line Adjustment (NICU/IV Team Only) No 01/04/23 2129  Dressing Intervention New dressing;Adhesive placed at insertion site (IV team only) 01/04/23 2129  Dressing Change Due 01/11/23 01/04/23 2129       Christeen Douglas 01/04/2023, 9:34 PM

## 2023-01-04 NOTE — Progress Notes (Signed)
PHARMACY - ANTICOAGULATION CONSULT NOTE  Pharmacy Consult for Heparin  Indication:  rule out pulmonary embolus  No Known Allergies  Patient Measurements: Height: 5\' 6"  (167.6 cm) Weight: 84 kg (185 lb 3 oz) IBW/kg (Calculated) : 63.8  Vital Signs: Temp: 102 F (38.9 C) (11/06 1420) Temp Source: Oral (11/06 1420) BP: 95/46 (11/06 1430) Pulse Rate: 101 (11/06 1430)  Labs: Recent Labs    01/04/23 0049 01/04/23 0101 01/04/23 0240 01/04/23 1352  HGB 11.7*  --   --   --   HCT 35.1*  --   --   --   PLT 271  --   --   --   APTT  --  27  --   --   LABPROT  --  13.4  --   --   INR  --  1.0  --   --   HEPARINUNFRC  --   --   --  0.27*  CREATININE 1.34*  --   --   --   TROPONINIHS 47*  --  77*  --     Estimated Creatinine Clearance: 45.5 mL/min (A) (by C-G formula based on SCr of 1.34 mg/dL (H)).   Medical History: Past Medical History:  Diagnosis Date   Abscess of back    Arthritis    Bacterial infection 2015   Cancer (HCC)    skin    Diabetes mellitus without complication (HCC)    Dysrhythmia    Edema    RIGHT ANKLE   GERD (gastroesophageal reflux disease)    Hypertension    Trigeminal nerve disorder    Trigeminal neuralgia      Assessment:  82 y/o M with altered mental status, fever, tachypnea. CT anigo with possible PE, recommended considering VQ scan. Starting heparin for now. Above labs reviewed. PTA meds reviewed.   Initial heparin level subtherapeutic: 0.27, on 1100 units/hr. No issues with infusion or overt s/sx of bleeding.   Goal of Therapy:  Heparin level 0.3-0.7 units/ml Monitor platelets by anticoagulation protocol: Yes   Plan:  Heparin 1000 units BOLUS Increase heparin drip to 1250 units/hr 2300 Heparin level Daily CBC/Heparin level Monitor for bleeding  Ruben Im, PharmD Clinical Pharmacist 01/04/2023 3:06 PM Please check AMION for all [facility] Pharmacy numbers

## 2023-01-04 NOTE — ED Notes (Signed)
MD notified about low BP

## 2023-01-04 NOTE — Procedures (Signed)
PROCEDURE SUMMARY:  Successful fluoro-guided LP at L3-L4.  Yielded 12 mL of colorless fluid.  No immediate complications.  Pt tolerated well.   Specimen was sent for labs.  Patient with changes in BP, down to 78/46 at completion of procedure.  Primary team notified.   EBL < 5mL  Hoyt Koch PA-C 01/04/2023 4:23 PM

## 2023-01-04 NOTE — Significant Event (Addendum)
Patient has persistent fever as high as 103 since this am. Last dose of tylenol was 8:30 am. I did not get any notification or page from nursing staff regarding persistent fever. I found out when I reviewed the chart at 2 pm.  I have seen and examined the patient again. Wife at bedside. Patient continues to have high fever and is altered.  Able to mumble but unable to have a meaningful conversation.  Given persistent high fever, need to rule out intracranial infection I personally spoke with IR PA request a lumbar puncture under fluoroscopy. CSF samples to be sent for meningitis/encephalitis panel.  Ordered. After lumbar puncture is done, we will upgrade antimicrobial coverage Spoke with ID Dr. Elinor Parkinson -recommended empiric coverage with IV Rocephin, IV vancomycin, IV ampicillin and IV acyclovir.  I have spoken to the nurse and asked her to give Tylenol more frequently for next 24 hours. Order in place.  Will hold off VQ scan for now as we need to prioritize lumbar puncture

## 2023-01-04 NOTE — Progress Notes (Signed)
Attempted Echocardiogram, patient is on the way to Nuclear Med

## 2023-01-05 ENCOUNTER — Inpatient Hospital Stay (HOSPITAL_COMMUNITY): Payer: Medicare Other

## 2023-01-05 DIAGNOSIS — R9431 Abnormal electrocardiogram [ECG] [EKG]: Secondary | ICD-10-CM | POA: Diagnosis not present

## 2023-01-05 DIAGNOSIS — G9341 Metabolic encephalopathy: Secondary | ICD-10-CM | POA: Diagnosis not present

## 2023-01-05 DIAGNOSIS — A419 Sepsis, unspecified organism: Secondary | ICD-10-CM | POA: Diagnosis not present

## 2023-01-05 DIAGNOSIS — R7989 Other specified abnormal findings of blood chemistry: Secondary | ICD-10-CM

## 2023-01-05 DIAGNOSIS — K529 Noninfective gastroenteritis and colitis, unspecified: Secondary | ICD-10-CM | POA: Diagnosis not present

## 2023-01-05 DIAGNOSIS — R609 Edema, unspecified: Secondary | ICD-10-CM | POA: Diagnosis not present

## 2023-01-05 DIAGNOSIS — R6521 Severe sepsis with septic shock: Secondary | ICD-10-CM | POA: Diagnosis not present

## 2023-01-05 LAB — CSF CELL COUNT WITH DIFFERENTIAL
RBC Count, CSF: 0 /mm3
Tube #: 4
WBC, CSF: 0 /mm3 (ref 0–5)

## 2023-01-05 LAB — HEPARIN LEVEL (UNFRACTIONATED)
Heparin Unfractionated: 0.32 [IU]/mL (ref 0.30–0.70)
Heparin Unfractionated: 0.43 [IU]/mL (ref 0.30–0.70)

## 2023-01-05 LAB — PHOSPHORUS: Phosphorus: 3.8 mg/dL (ref 2.5–4.6)

## 2023-01-05 LAB — ECHOCARDIOGRAM COMPLETE
AR max vel: 1.94 cm2
AV Area VTI: 1.83 cm2
AV Area mean vel: 1.82 cm2
AV Mean grad: 7 mm[Hg]
AV Peak grad: 12.3 mm[Hg]
Ao pk vel: 1.75 m/s
Area-P 1/2: 2.77 cm2
Height: 66 in
S' Lateral: 2.7 cm
Weight: 2962.98 [oz_av]

## 2023-01-05 LAB — GLUCOSE, CAPILLARY
Glucose-Capillary: 129 mg/dL — ABNORMAL HIGH (ref 70–99)
Glucose-Capillary: 128 mg/dL — ABNORMAL HIGH (ref 70–99)
Glucose-Capillary: 166 mg/dL — ABNORMAL HIGH (ref 70–99)
Glucose-Capillary: 178 mg/dL — ABNORMAL HIGH (ref 70–99)
Glucose-Capillary: 98 mg/dL (ref 70–99)
Glucose-Capillary: 162 mg/dL — ABNORMAL HIGH (ref 70–99)

## 2023-01-05 LAB — CBC
HCT: 31 % — ABNORMAL LOW (ref 39.0–52.0)
Hemoglobin: 9.8 g/dL — ABNORMAL LOW (ref 13.0–17.0)
MCH: 31 pg (ref 26.0–34.0)
MCHC: 31.6 g/dL (ref 30.0–36.0)
MCV: 98.1 fL (ref 80.0–100.0)
Platelets: 207 10*3/uL (ref 150–400)
RBC: 3.16 MIL/uL — ABNORMAL LOW (ref 4.22–5.81)
RDW: 12.6 % (ref 11.5–15.5)
WBC: 19.8 10*3/uL — ABNORMAL HIGH (ref 4.0–10.5)
nRBC: 0 % (ref 0.0–0.2)

## 2023-01-05 LAB — BASIC METABOLIC PANEL
Anion gap: 8 (ref 5–15)
BUN: 26 mg/dL — ABNORMAL HIGH (ref 8–23)
CO2: 15 mmol/L — ABNORMAL LOW (ref 22–32)
Calcium: 6.7 mg/dL — ABNORMAL LOW (ref 8.9–10.3)
Chloride: 110 mmol/L (ref 98–111)
Creatinine, Ser: 1.69 mg/dL — ABNORMAL HIGH (ref 0.61–1.24)
GFR, Estimated: 41 mL/min — ABNORMAL LOW (ref >60)
Glucose, Bld: 220 mg/dL — ABNORMAL HIGH (ref 70–99)
Potassium: 3.6 mmol/L (ref 3.5–5.1)
Sodium: 133 mmol/L — ABNORMAL LOW (ref 135–145)

## 2023-01-05 LAB — CORTISOL: Cortisol, Plasma: 11.1 ug/dL

## 2023-01-05 LAB — MAGNESIUM: Magnesium: 1.5 mg/dL — ABNORMAL LOW (ref 1.7–2.4)

## 2023-01-05 LAB — HIV ANTIBODY (ROUTINE TESTING W REFLEX): HIV Screen 4th Generation wRfx: NONREACTIVE

## 2023-01-05 LAB — TROPONIN I (HIGH SENSITIVITY): Troponin I (High Sensitivity): 2150 ng/L (ref <18)

## 2023-01-05 MED ORDER — ACETAMINOPHEN 500 MG PO TABS: 500.0000 mg | ORAL_TABLET | ORAL | Status: DC | PRN

## 2023-01-05 MED ORDER — INSULIN ASPART 100 UNIT/ML IJ SOLN
2.0000 [IU] | Freq: Three times a day (TID) | INTRAMUSCULAR | Status: DC
  Administered 2023-01-05 – 2023-01-06 (×2): 2 [IU] via SUBCUTANEOUS

## 2023-01-05 MED ORDER — SODIUM CHLORIDE 0.9 % IV SOLN
2.0000 g | INTRAVENOUS | Status: AC
  Administered 2023-01-06 – 2023-01-09 (×4): 2 g via INTRAVENOUS
  Filled 2023-01-05 (×4): qty 20

## 2023-01-05 MED ORDER — PERFLUTREN LIPID MICROSPHERE
1.0000 mL | INTRAVENOUS | Status: AC | PRN
  Administered 2023-01-05: 4 mL via INTRAVENOUS

## 2023-01-05 MED ORDER — GABAPENTIN 300 MG PO CAPS: 600.0000 mg | ORAL_CAPSULE | Freq: Three times a day (TID) | ORAL | Status: DC

## 2023-01-05 MED ORDER — POTASSIUM CHLORIDE 10 MEQ/50ML IV SOLN
10.0000 meq | INTRAVENOUS | Status: AC
  Administered 2023-01-05 (×4): 10 meq via INTRAVENOUS
  Filled 2023-01-05 (×2): qty 50

## 2023-01-05 MED ORDER — ACETAMINOPHEN 500 MG PO TABS
500.0000 mg | ORAL_TABLET | ORAL | Status: DC | PRN
  Administered 2023-01-05 – 2023-01-06 (×2): 500 mg via ORAL
  Filled 2023-01-05 (×2): qty 1

## 2023-01-05 MED ORDER — OXYCODONE HCL 5 MG PO TABS
5.0000 mg | ORAL_TABLET | ORAL | Status: DC | PRN
  Administered 2023-01-05 (×2): 5 mg via ORAL
  Filled 2023-01-05 (×2): qty 1

## 2023-01-05 MED ORDER — ACETAMINOPHEN 650 MG RE SUPP
RECTAL | Status: AC
  Filled 2023-01-05: qty 1

## 2023-01-05 MED ORDER — ACETAMINOPHEN 650 MG RE SUPP
650.0000 mg | RECTAL | Status: DC | PRN
  Administered 2023-01-05: 650 mg via RECTAL

## 2023-01-05 MED ORDER — METRONIDAZOLE 500 MG/100ML IV SOLN
500.0000 mg | Freq: Three times a day (TID) | INTRAVENOUS | Status: DC
  Administered 2023-01-05 – 2023-01-06 (×3): 500 mg via INTRAVENOUS
  Filled 2023-01-05 (×3): qty 100

## 2023-01-05 MED ORDER — MAGNESIUM SULFATE 4 GM/100ML IV SOLN
4.0000 g | Freq: Once | INTRAVENOUS | Status: AC
Start: 2023-01-05 — End: 2023-01-05
  Administered 2023-01-05: 4 g via INTRAVENOUS
  Filled 2023-01-05: qty 100

## 2023-01-05 MED ORDER — INSULIN GLARGINE-YFGN 100 UNIT/ML ~~LOC~~ SOLN
5.0000 [IU] | Freq: Every day | SUBCUTANEOUS | Status: DC
  Administered 2023-01-05 – 2023-01-09 (×5): 5 [IU] via SUBCUTANEOUS
  Filled 2023-01-05 (×5): qty 0.05

## 2023-01-05 MED ORDER — MEPERIDINE HCL 25 MG/ML IJ SOLN
12.5000 mg | Freq: Once | INTRAMUSCULAR | Status: AC
  Administered 2023-01-05: 12.5 mg via INTRAVENOUS
  Filled 2023-01-05: qty 1

## 2023-01-05 MED ORDER — ACETAMINOPHEN 650 MG RE SUPP: 650.0000 mg | RECTAL | Status: DC | PRN

## 2023-01-05 MED ORDER — GADOBUTROL 1 MMOL/ML IV SOLN
8.0000 mL | Freq: Once | INTRAVENOUS | Status: AC | PRN
  Administered 2023-01-05: 8 mL via INTRAVENOUS

## 2023-01-05 MED ORDER — GABAPENTIN 300 MG PO CAPS
600.0000 mg | ORAL_CAPSULE | Freq: Three times a day (TID) | ORAL | Status: DC
  Administered 2023-01-05 – 2023-01-09 (×14): 600 mg via ORAL
  Filled 2023-01-05 (×15): qty 2

## 2023-01-05 NOTE — Progress Notes (Signed)
PT Cancellation Note  Patient Details Name: Larry Lane MRN: [id number] DOB: October 18, 1940   Cancelled Treatment:    Reason Eval/Treat Not Completed: Patient at procedure or test/unavailable   Currently having ECHO performed in room. Will check back shortly for comprehensive PT evaluation.  Kathlyn Sacramento, PT, DPT [facility] Physical Therapist Office: [phone] Website: [email or web address]   Berton Mount 01/05/2023, 9:12 AM

## 2023-01-05 NOTE — Progress Notes (Signed)
Echocardiogram 2D Echocardiogram has been performed.  Larry Lane 01/05/2023, 9:33 AM

## 2023-01-05 NOTE — Progress Notes (Signed)
PHARMACY - ANTICOAGULATION CONSULT NOTE  Pharmacy Consult for Heparin  Indication:  rule out pulmonary embolus  No Known Allergies  Patient Measurements: Height: 5\' 6"  (167.6 cm) Weight: 83.8 kg (184 lb 11.9 oz) IBW/kg (Calculated) : 63.8  Vital Signs: Temp: 98.2 F (36.8 C) (11/07 1058) Temp Source: Oral (11/07 1058) BP: 112/68 (11/07 1300) Pulse Rate: 68 (11/07 1300)  Labs: Recent Labs    01/04/23 0049 01/04/23 0101 01/04/23 0240 01/04/23 1352 01/04/23 1352 01/04/23 2228 01/04/23 2328 01/05/23 0355 01/05/23 1141  HGB 11.7*  --   --   --   --   --   --  9.8*  --   HCT 35.1*  --   --   --   --   --   --  31.0*  --   PLT 271  --   --   --   --   --   --  207  --   APTT  --  27  --   --   --   --   --   --   --   LABPROT  --  13.4  --   --   --   --   --   --   --   INR  --  1.0  --   --   --   --   --   --   --   HEPARINUNFRC  --   --   --  0.27*   < > 0.84* 0.32  --  0.43  CREATININE 1.34*  --   --   --   --   --   --  1.69*  --   TROPONINIHS 47*  --  77* 3,448*  --   --   --  2,150*  --    < > = values in this interval not displayed.    Estimated Creatinine Clearance: 36 mL/min (A) (by C-G formula based on SCr of 1.69 mg/dL (H)).   Medical History: Past Medical History:  Diagnosis Date   Abscess of back    Arthritis    Bacterial infection 2015   Cancer (HCC)    skin    Diabetes mellitus without complication (HCC)    Dysrhythmia    Edema    RIGHT ANKLE   GERD (gastroesophageal reflux disease)    Hypertension    Trigeminal nerve disorder    Trigeminal neuralgia      Assessment:  82 y/o M with altered mental status, fever, tachypnea. CT anigo with possible PE. Heparin for now. Above labs reviewed. PTA meds reviewed.   Heparin level therapeutic: 0.43 on 1250 units/hr. This is second therapeutic level on current dose. Will only require daily heparin levels now.   Goal of Therapy:  Heparin level 0.3-0.7 units/ml Monitor platelets by anticoagulation  protocol: Yes   Plan:  Continue heparin drip at 1250 units/hr Daily CBC/Heparin level Monitor for bleeding  Cedric Fishman, PharmD, BCPS, BCCCP Clinical Pharmacist

## 2023-01-05 NOTE — Evaluation (Signed)
Physical Therapy Evaluation Patient Details Name: Larry Lane MRN: [id number] DOB: September 09, 1940 Today's Date: 01/05/2023  History of Present Illness  82 y/o gentleman who presented 11/6 with episodes of confusion at home and rigors; Septic shock concern for meningitis, Acute metabolic encephalopathy, Troponin elevation concern for PE.  With a history of trigeminal neuralgia, HTN   Clinical Impression  Pt admitted with above complications. Independent PTA, does not use assistive device but reports some balance impairments for the past couple of years, no falls but does report near misses. Reports left anterior thigh pain, end range muscle guarding with hip flexion, FABER and hip thrust test, BIL LLE strength grossly 5/5 except Lt knee extension and Lt hip flexion which was inconsistent, possibly due to muscle guarding? Vs tone. Able to fully bear weight and ambulate short distance up to 40 feet with RW for support. Mod assist to rise to EOB, Min assist to stand, and CGA for gait. VSS throughout on RA. Will follow and progress during admission. Pt currently with functional limitations due to the deficits listed below (see PT Problem List). Pt will benefit from acute skilled PT to increase their independence and safety with mobility to allow discharge.           If plan is discharge home, recommend the following: A lot of help with walking and/or transfers;A little help with bathing/dressing/bathroom;Assistance with cooking/housework;Direct supervision/assist for medications management;Direct supervision/assist for financial management;Assist for transportation;Help with stairs or ramp for entrance   Can travel by private vehicle        Equipment Recommendations None recommended by PT  Recommendations for Other Services       Functional Status Assessment Patient has had a recent decline in their functional status and demonstrates the ability to make significant improvements in function in a  reasonable and predictable amount of time.     Precautions / Restrictions Precautions Precautions: Fall Restrictions Weight Bearing Restrictions: No      Mobility  Bed Mobility Overal bed mobility: Needs Assistance Bed Mobility: Supine to Sit, Sit to Supine     Supine to sit: Mod assist, Used rails, HOB elevated Sit to supine: Min assist   General bed mobility comments: Mod assist for trunk support and LLE support to rise to EOB. Min assist for LLE support back into bed. Cues for rail use and sequencing required.    Transfers Overall transfer level: Needs assistance Equipment used: Rolling walker (2 wheels) Transfers: Sit to/from Stand Sit to Stand: Min assist           General transfer comment: Light min assist for boost to stand from EOB. VC for hand placement.    Ambulation/Gait Ambulation/Gait assistance: Contact guard assist Gait Distance (Feet): 40 Feet Assistive device: Rolling walker (2 wheels) Gait Pattern/deviations: Step-through pattern, Decreased stride length, Trunk flexed Gait velocity: dec Gait velocity interpretation: <1.31 ft/sec, indicative of household ambulator   General Gait Details: Educated on safe AD use with RW, cues for upright posture and forward gaze, minor tremor in LEs but no overt buckling noted during bout. VSS  Stairs            Wheelchair Mobility     Tilt Bed    Modified Rankin (Stroke Patients Only)       Balance Overall balance assessment: Needs assistance Sitting-balance support: No upper extremity supported, Feet supported Sitting balance-Leahy Scale: Fair     Standing balance support: Bilateral upper extremity supported, Reliant on assistive device for balance Standing balance-Leahy Scale:  Poor                               Pertinent Vitals/Pain Pain Assessment Pain Assessment: Faces Faces Pain Scale: Hurts little more Pain Location: Lt anterior thigh Pain Descriptors / Indicators:  Sore Pain Intervention(s): Limited activity within patient's tolerance, Monitored during session, Repositioned    Home Living Family/patient expects to be discharged to:: Private residence Living Arrangements: Spouse/significant other Available Help at Discharge: Family Type of Home: House Home Access: Stairs to enter Entrance Stairs-Rails: Doctor, general practice of Steps: 6   Home Layout: One level (1 step down into living room) Home Equipment: Agricultural consultant (2 wheels)      Prior Function Prior Level of Function : Independent/Modified Independent (Near miss falls over the past couple of years)             Mobility Comments: ind, retired Visual merchandiser ADLs Comments: ind     Extremity/Trunk Assessment   Upper Extremity Assessment Upper Extremity Assessment: Defer to OT evaluation    Lower Extremity Assessment Lower Extremity Assessment: LLE deficits/detail;Difficult to assess due to impaired cognition LLE Deficits / Details: Ankle DF, PF, eversion, Knee flexion, hip extension, hip abduction grossly 5/5. However inconsistent with Lt knee extension and hip flexion ranging from 2/5 to 4/5. Seems to be pain limited at times. Unsure if guarding or tone with hip ROM, some pain with FABER and hip thrust. LLE: Unable to fully assess due to pain       Communication   Communication Communication: No apparent difficulties Cueing Techniques: Verbal cues;Gestural cues  Cognition Arousal: Alert Behavior During Therapy: WFL for tasks assessed/performed Overall Cognitive Status: No family/caregiver present to determine baseline cognitive functioning                                          General Comments General comments (skin integrity, edema, etc.): HR 78, BP 120/54, SpO2 100% on RA. No significant change with mobility during session.    Exercises     Assessment/Plan    PT Assessment Patient needs continued PT services  PT Problem List Decreased  strength;Decreased range of motion;Decreased balance;Decreased activity tolerance;Decreased mobility;Decreased cognition;Decreased knowledge of use of DME;Cardiopulmonary status limiting activity;Impaired tone;Pain       PT Treatment Interventions DME instruction;Gait training;Stair training;Functional mobility training;Therapeutic activities;Therapeutic exercise;Balance training;Cognitive remediation;Neuromuscular re-education;Patient/family education    PT Goals (Current goals can be found in the Care Plan section)  Acute Rehab PT Goals Patient Stated Goal: Get well PT Goal Formulation: With patient Time For Goal Achievement: 01/19/23 Potential to Achieve Goals: Good    Frequency Min 1X/week     Co-evaluation PT/OT/SLP Co-Evaluation/Treatment: Yes Reason for Co-Treatment: Complexity of the patient's impairments (multi-system involvement);Necessary to address cognition/behavior during functional activity;For patient/therapist safety;To address functional/ADL transfers PT goals addressed during session: Mobility/safety with mobility;Balance;Proper use of DME         AM-PAC PT "6 Clicks" Mobility  Outcome Measure Help needed turning from your back to your side while in a flat bed without using bedrails?: A Little Help needed moving from lying on your back to sitting on the side of a flat bed without using bedrails?: A Lot Help needed moving to and from a bed to a chair (including a wheelchair)?: A Little Help needed standing up from a chair using your arms (e.g.,  wheelchair or bedside chair)?: A Little Help needed to walk in hospital room?: A Little Help needed climbing 3-5 steps with a railing? : A Little 6 Click Score: 17    End of Session Equipment Utilized During Treatment: Gait belt Activity Tolerance: Patient tolerated treatment well Patient left: in bed;with call bell/phone within reach;with bed alarm set;with nursing/sitter in room Nurse Communication: Mobility  status PT Visit Diagnosis: Unsteadiness on feet (R26.81);Other abnormalities of gait and mobility (R26.89);Muscle weakness (generalized) (M62.81)    Time: 4696-2952 PT Time Calculation (min) (ACUTE ONLY): 24 min   Charges:   PT Evaluation $PT Eval Low Complexity: 1 Low   PT General Charges $$ ACUTE PT VISIT: 1 Visit         Kathlyn [place], PT, DPT [facility]  Rehabilitation Services Physical Therapist Office: [phone] Website: [email or web address]   Berton Mount 01/05/2023, 11:36 AM

## 2023-01-05 NOTE — Progress Notes (Signed)
PT transported to MRI and came back to 8m02 without complications.

## 2023-01-05 NOTE — Evaluation (Signed)
Occupational Therapy Evaluation Patient Details Name: Larry Lane MRN: [id number] DOB: 09-24-1940 Today's Date: 01/05/2023   History of Present Illness 82 y/o gentleman who presented 11/6 with episodes of confusion at home and rigors; Septic shock concern for meningitis, Acute metabolic encephalopathy, Troponin elevation concern for PE.  With a history of trigeminal neuralgia, HTN   Clinical Impression   Pt is typically independent and drives. He is a former Visual merchandiser. He does endorse some imbalance as he is aging. Pt presents with generalized weakness, impaired cognition and decreased standing balance. He demonstrates difficulty lifting L LE OOB, but was able to bear weight and walk with RW with CGA a short distance. L LE pain reports were inconsistent. Pt needs set up to max assist for ADLs. He demonstrates difficulty with sequencing and following some commands. He does not recall events leading up to and immediately following admission. VSS on RA. Pt returned to bed for upcoming test. Anticipate pt will be able to return home with HHOT.      If plan is discharge home, recommend the following: A little help with walking and/or transfers;Assistance with cooking/housework;A lot of help with bathing/dressing/bathroom;Direct supervision/assist for medications management;Direct supervision/assist for financial management;Assist for transportation;Help with stairs or ramp for entrance    Functional Status Assessment  Patient has had a recent decline in their functional status and demonstrates the ability to make significant improvements in function in a reasonable and predictable amount of time.  Equipment Recommendations  Tub/shower seat    Recommendations for Other Services       Precautions / Restrictions Precautions Precautions: Fall Restrictions Weight Bearing Restrictions: No      Mobility Bed Mobility Overal bed mobility: Needs Assistance Bed Mobility: Supine to Sit, Sit to  Supine     Supine to sit: Mod assist, Used rails, HOB elevated Sit to supine: Min assist   General bed mobility comments: Mod assist for trunk support and LLE support to rise to EOB. Min assist for LLE support back into bed. Cues for rail use and sequencing required.    Transfers Overall transfer level: Needs assistance Equipment used: Rolling walker (2 wheels) Transfers: Sit to/from Stand Sit to Stand: Min assist           General transfer comment: Light min assist for boost to stand from EOB. VC for hand placement.      Balance Overall balance assessment: Needs assistance   Sitting balance-Leahy Scale: Fair     Standing balance support: Bilateral upper extremity supported, Reliant on assistive device for balance Standing balance-Leahy Scale: Poor                             ADL either performed or assessed with clinical judgement   ADL Overall ADL's : Needs assistance/impaired Eating/Feeding: Set up;Bed level   Grooming: Sitting;Supervision/safety   Upper Body Bathing: Minimal assistance;Sitting   Lower Body Bathing: Maximal assistance;Sit to/from stand   Upper Body Dressing : Minimal assistance;Sitting   Lower Body Dressing: Maximal assistance;Sit to/from stand   Toilet Transfer: Minimal assistance;Ambulation;Rolling walker (2 wheels)           Functional mobility during ADLs: Minimal assistance;Rolling walker (2 wheels)       Vision Ability to See in Adequate Light: 0 Adequate Patient Visual Report: No change from baseline       Perception         Praxis  Pertinent Vitals/Pain Pain Assessment Pain Assessment: Faces Faces Pain Scale: Hurts little more Pain Location: Lt anterior thigh Pain Descriptors / Indicators: Sore Pain Intervention(s): Monitored during session, Repositioned     Extremity/Trunk Assessment Upper Extremity Assessment Upper Extremity Assessment: Right hand dominant;RUE deficits/detail;LUE  deficits/detail RUE Deficits / Details: edematous LUE Deficits / Details: edematous   Lower Extremity Assessment Lower Extremity Assessment: Defer to PT evaluation   Cervical / Trunk Assessment Cervical / Trunk Assessment: Other exceptions (weakness)   Communication Communication Communication: No apparent difficulties Cueing Techniques: Verbal cues;Gestural cues   Cognition Arousal: Alert Behavior During Therapy: WFL for tasks assessed/performed Overall Cognitive Status: Impaired/Different from baseline Area of Impairment: Memory, Following commands, Problem solving                     Memory: Decreased short-term memory Following Commands: Follows one step commands with increased time     Problem Solving: Difficulty sequencing, Requires verbal cues General Comments: pt with varying descriptions of his current pain or timeline of his symptoms, does not recall many events upon admission, cognition needs further assessment     General Comments      Exercises     Shoulder Instructions      Home Living Family/patient expects to be discharged to:: Private residence Living Arrangements: Spouse/significant other Available Help at Discharge: Family;Available 24 hours/day Type of Home: House Home Access: Stairs to enter [employer] of Steps: 6 Entrance Stairs-Rails: Right;Left Home Layout: One level;Other (Comment) (1 step down to living room)     Bathroom Shower/Tub: Producer, television/film/video: Standard Bathroom Accessibility: Yes   Home Equipment: Agricultural consultant (2 wheels) (wife may have given away)          Prior Functioning/Environment Prior Level of Function : Independent/Modified Independent;Driving;History of Falls (last six months)             Mobility Comments: ind, retired Visual merchandiser ADLs Comments: ind        OT Problem List: Decreased strength;Decreased activity tolerance;Impaired balance (sitting and/or standing);Decreased  cognition;Decreased safety awareness;Decreased knowledge of use of DME or AE;Pain      OT Treatment/Interventions: Self-care/ADL training;DME and/or AE instruction;Therapeutic activities;Cognitive remediation/compensation;Patient/family education;Balance training    OT Goals(Current goals can be found in the care plan section) Acute Rehab OT Goals OT Goal Formulation: With patient Time For Goal Achievement: 01/19/23 Potential to Achieve Goals: Good ADL Goals Pt Will Perform Grooming: with supervision;standing Pt Will Perform Lower Body Bathing: with min assist;sit to/from stand Pt Will Perform Lower Body Dressing: with min assist;sit to/from stand Pt Will Transfer to Toilet: with supervision;ambulating;regular height toilet Pt Will Perform Toileting - Clothing Manipulation and hygiene: with supervision;sit to/from stand Additional ADL Goal #1: Pt will perform bed mobility with supervision in preparation for ADLs. Additional ADL Goal #2: Pt will follow 3 step commands during ADLs and mobility.  OT Frequency: Min 1X/week    Co-evaluation   Reason for Co-Treatment: Complexity of the patient's impairments (multi-system involvement);Necessary to address cognition/behavior during functional activity;For patient/therapist safety;To address functional/ADL transfers PT goals addressed during session: Mobility/safety with mobility;Balance;Proper use of DME        AM-PAC OT "6 Clicks" Daily Activity     Outcome Measure Help from another person eating meals?: None Help from another person taking care of personal grooming?: A Little Help from another person toileting, which includes using toliet, bedpan, or urinal?: A Little Help from another person bathing (including washing, rinsing, drying)?: A Lot Help  from another person to put on and taking off regular upper body clothing?: A Little Help from another person to put on and taking off regular lower body clothing?: A Lot 6 Click Score: 17    End of Session Equipment Utilized During Treatment: Rolling walker (2 wheels);Gait belt Nurse Communication: Mobility status  Activity Tolerance: Patient tolerated treatment well Patient left: in bed;with call bell/phone within reach;with bed alarm set;with nursing/sitter in room  OT Visit Diagnosis: Unsteadiness on feet (R26.81);Other abnormalities of gait and mobility (R26.89);Muscle weakness (generalized) (M62.81);Pain;Other symptoms and signs involving cognitive function;History of falling (Z91.81)                Time: 1001-1025 OT Time Calculation (min): 24 min Charges:  OT General Charges $OT Visit: 1 Visit OT Evaluation $OT Eval Moderate Complexity: 1 Mod Berna Spare, OTR/L Acute Rehabilitation Services Office: [phone]  Evern Bio 01/05/2023, 12:06 PM

## 2023-01-05 NOTE — Progress Notes (Signed)
NAME:  Larry Lane, MRN:  [id number], DOB:  [birth year], LOS: 1 ADMISSION DATE:  01/04/2023, CONSULTATION DATE:  01/04/2023 REFERRING MD:  Larry Lane CHIEF COMPLAINT:  Hypotension   History of Present Illness:  Larry Lane is a 82 y/o gentleman with a history of trigeminal neuralgia, HTN who presented with episodes of confusion at home and rigors last night. He presented to the ED and was started on empiric ceftriaxone for unknown infection. He underwent LP today for rule out of encephalitis and meningitis after having a prolonged fever ~103 degrees today. Antibiotics were escalated with his fever. He has remained confused today. After his LP his BP has been lower with MAP 55-60. PCCM was consulted. He did not receive his home antihypertensives this morning.    Yesterday he had an appointment with his doctor about trigmeninal neuralgia, and the only med change made was increased carbamazepime from 3 to 4 times daily. Gabapentin dose remained the same.  Pertinent  Medical History  HTN  Trigeminal neuralgia  Significant Hospital Events: Including procedures, antibiotic start and stop dates in addition to other pertinent events   11/6 admitted, fevers, antibiotics esclated. LP done, peripheral pressors started after.  11/7 remains on pressors, intermittent fevers  Interim History / Subjective:  He is having some left hip pain and resulting weakness with hip movement in the bed. He did not have hip pain prior to arrival. Otherwise is back to baseline. He had a bowel movement overnight and is not having any abdominal pain or nausea.  Objective   Blood pressure (!) 125/47, pulse 78, temperature (!) 100.7 F (38.2 C), resp. rate 20, height 5\' 6"  (1.676 m), weight 83.8 kg, SpO2 100%.        Intake/Output Summary (Last 24 hours) at 01/05/2023 0619 Last data filed at 01/05/2023 0600 Gross per 24 hour  Intake 5867.77 ml  Output 975 ml  Net 4892.77 ml   Filed Weights   01/04/23 0042 01/04/23  1745  Weight: 84 kg 83.8 kg    Examination: Constitutional:slightly tired but comfortably appearing elderly male. In no acute distress. HENT: Normocephalic, atraumatic,  Eyes: Sclera non-icteric, PERRL, EOM intact Cardio:Regular rate and rhythm.  2+ bilateral radial and dorsalis pedis  pulses. Pulm:Clear to auscultation bilaterally. Normal work of breathing on room air. Abdomen: Soft, non-tender, non-distended, positive bowel sounds. QQV:ZDGLOVFI for extremity edema. Skin:Warm and dry. Neuro:Alert and oriented x3. Intact strength and sensation in bilateral UE. Intact sensation in bilateral LE. Pain limiting hip strength testing in left hip, otherwise intact strength in bilateral LE. CN II-XII intact.  Psych:Pleasant mood and affect.   Resolved Hospital Problem list     Assessment & Plan:  Shock- septic vs cardiogenic cannot rule out sepsis but unclear source, CSF studies notable for mildly elevated protein but no wbcs or organisms and men/enc panel negative. Empirically started acyclovir, ampicillin, ceftriaxone, and vancomycin.  Larry Lane's sign on TTE with PE on CTA so some concerns for cardiogenic shock  Good perfusion this morning with warm extremities and baseline mentation  -Norepinephrine through PICC line with MAP goal >65 -continue ceftriaxone and vancomycin -d/c acyclovir and ampicillin with negative HSV and listeria  -continue heparin for PE -monitor fevers and blood/CSF cultures  Likely pulmonary embolism with troponin elevation sub optimal chest CTA with possible LUL and LLL pulmonary emboli TTE shows RV dysfunction consistent with Larry Lane's sign, LV is hyperdynamic Troponin peak at 3500 with down trend without ischemic EKG changes S1Q3T3 on admission EKG, improved on repeat -  continue heparin gtt -VQ scan ordered  Acute metabolic encephalopathy- as above likely related to sepsis and hypotension, improved to baseline with a little fatigue this morning. CT head  negative.  AKI 2/2 hypotension NAGMA Hypomagnesemia Baseline creatinine 0.9 with rise to 1.69 today. 1 L urine output overnight. Bicarb 15, likely 2/2 IVF -encourage PO hydration  -daily BMP/mg/phos, replete for K>4, Mg>2 -monitor UOP  ?Early SBO on CT Low suspicion for obstruction clinically. BM overnight. Abdomen is soft and non-tender this morning -regular diet ordered  Left hip pain and weakness Exam is reassuring for septic joint, neuromuscular weakness, or lumbar radiculopathy. Most consistent with MSK injury or OA. CT abd/pel w/ contrast did not show any signs of joint effusion or inflammation.  -tylenol for pain prn -PT  -monitor for new or worsening symptoms  Hypovolemic hyponatremia  Sodium 132 on admission and stable at 133 this morning after IVF resuscitation -PO hydration and solute intake -daily BMP   DM A1c 7.6 here -SSI  HTN Home meds include amlidipine and losartan - hold with hypotension  Anemia Baseline hgb around 12 which is consistent with presenting hgb but down to 9.8 after 5 liter positive fluid balance - CTM, transfuse <7  Trigeminal Neuralgia Home meds of carbamazepine with recent increase in dose and gabapentin -continue home meds  Best Practice (right click and "Reselect all SmartList Selections" daily)   Diet/type: Regular consistency (see orders) DVT prophylaxis: systemic heparin GI prophylaxis: PPI Lines: N/A Foley:  N/A Code Status:  full code Last date of multidisciplinary goals of care discussion [01/05/2023]  Labs   CBC: Recent Labs  Lab 01/04/23 0049 01/05/23 0355  WBC 8.0 19.8*  NEUTROABS 7.6  --   HGB 11.7* 9.8*  HCT 35.1* 31.0*  MCV 93.6 98.1  PLT 271 207    Basic Metabolic Panel: Recent Labs  Lab 01/04/23 0049 01/05/23 0355  NA 132* 133*  K 3.9 3.6  CL 103 110  CO2 19* 15*  GLUCOSE 182* 220*  BUN 21 26*  CREATININE 1.34* 1.69*  CALCIUM 8.0* 6.7*  MG  --  1.5*  PHOS  --  3.8   GFR: Estimated  Creatinine Clearance: 36 mL/min (A) (by C-G formula based on SCr of 1.69 mg/dL (H)). Recent Labs  Lab 01/04/23 0049 01/04/23 0119 01/04/23 0307 01/04/23 1352 01/04/23 1834 01/04/23 2228 01/05/23 0355  WBC 8.0  --   --   --   --   --  19.8*  LATICACIDVEN  --    < > 2.0* 1.7 1.9 1.3  --    < > = values in this interval not displayed.    Liver Function Tests: Recent Labs  Lab 01/04/23 0049  AST 40  ALT 29  ALKPHOS 65  BILITOT 0.7  PROT 5.5*  ALBUMIN 3.1*   No results for input(s): "LIPASE", "AMYLASE" in the last 168 hours. No results for input(s): "AMMONIA" in the last 168 hours.  ABG No results found for: "PHART", "PCO2ART", "PO2ART", "HCO3", "TCO2", "ACIDBASEDEF", "O2SAT"   Coagulation Profile: Recent Labs  Lab 01/04/23 0101  INR 1.0    Cardiac Enzymes: No results for input(s): "CKTOTAL", "CKMB", "CKMBINDEX", "TROPONINI" in the last 168 hours.  HbA1C: Hgb A1c MFr Bld  Date/Time Value Ref Range Status  01/04/2023 01:03 AM 7.6 (H) 4.8 - 5.6 % Final    Comment:    (NOTE) Pre diabetes:          5.7%-6.4%  Diabetes:              >  6.4%  Glycemic control for   <7.0% adults with diabetes     CBG: Recent Labs  Lab 01/04/23 1417 01/04/23 1635 01/04/23 1909 01/04/23 2310 01/05/23 0342  GLUCAP 131* 127* 136* 117* 128*    Review of Systems:   Left hip pain.  Past Medical History:  He,  has a past medical history of Abscess of back, Arthritis, Bacterial infection (2015), Cancer (HCC), Diabetes mellitus without complication (HCC), Dysrhythmia, Edema, GERD (gastroesophageal reflux disease), Hypertension, Trigeminal nerve disorder, and Trigeminal neuralgia.   Surgical History:   Past Surgical History:  Procedure Laterality Date   APPENDECTOMY  2010   CATARACT EXTRACTION W/PHACO Right 01/26/2017   Procedure: CATARACT EXTRACTION PHACO AND INTRAOCULAR LENS PLACEMENT (IOC);  Surgeon: [place] Crane, MD;  Location: [facility] ORS;  Service: Ophthalmology;   Laterality: Right;  Lot # [id number] H [place]: 00:48.2 AP%: 9.9 CDE: 5.35   I&D of back abscess     TRIGEMINAL NERVE DECOMPRESSION       Social History:   reports that he has never smoked. He has never used smokeless tobacco. He reports that he does not drink alcohol and does not use drugs.   Family History:  His family history includes Heart disease in his father.   Allergies No Known Allergies   Home Medications  Prior to Admission medications   Medication Sig Start Date End Date Taking? Authorizing Provider  acetaminophen (TYLENOL) 500 MG tablet Take 1,000 mg by mouth every 6 (six) hours as needed.   Yes [provider]  amLODipine (NORVASC) 5 MG tablet Take 1 tablet by mouth daily. 05/23/22  Yes [provider]  Apoaequorin 10 MG CAPS Take 1 capsule by mouth daily. Prevagen   Yes [provider]  aspirin EC 81 MG tablet Take 325 mg by mouth daily.   Yes [provider]  carbamazepine (CARBATROL) 200 MG 12 hr capsule Take 200 mg by mouth 4 (four) times daily.   Yes [provider]  cholestyramine light (PREVALITE) 4 GM/DOSE powder Take 4 g by mouth 2 (two) times daily with a meal. 12/01/22  Yes [provider]  gabapentin (NEURONTIN) 600 MG tablet Take 600 mg by mouth 3 (three) times daily. 12/19/22  Yes [provider]  glucagon (GLUCAGON EMERGENCY) 1 MG injection Inject 1 mg into the skin. 06/18/13  Yes [provider]  insulin lispro (HUMALOG) 100 UNIT/ML injection Inject 20 Units into the skin once. Inject 20 units once per day; can bolus up to 56 units once a day as directed via VGO Pump   Yes [provider]  losartan (COZAAR) 50 MG tablet Take 50 mg daily by mouth.   Yes [provider]  Multiple Vitamin (MULTI-VITAMIN) tablet Take 1 tablet by mouth daily.   Yes [provider]  omeprazole (PRILOSEC) 20 MG capsule Take 20 mg by mouth daily.   Yes [provider]  rosuvastatin  (CRESTOR) 10 MG tablet Take 1 tablet by mouth daily. 11/24/22  Yes [provider]     Critical care time: 86    Rocky Morel, DO Internal Medicine Resident, PGY-2 Pager# [phone] [facility] 01/05/2023 6:19 AM  For contact information, see Amion. If no response to pager, please call PCCM consult pager. After hours, 7PM- 7AM, please call Elink.

## 2023-01-05 NOTE — Evaluation (Addendum)
Speech Language Pathology Evaluation Patient Details Name: CALIBER LANDESS MRN: [id number] DOB: Feb 12, 1941 Today's Date: 01/05/2023 Time: 3295-1884 SLP Time Calculation (min) (ACUTE ONLY): 18 min  Problem List:  Patient Active Problem List   Diagnosis Date Noted   SIRS (systemic inflammatory response syndrome) (HCC) 01/04/2023   Sepsis (HCC) 01/04/2023   Partial obstruction of small intestine (HCC) 01/04/2023   AKI (acute kidney injury) (HCC) 01/04/2023   Septic shock (HCC) 01/04/2023   Hyperglycemia 01/04/2023   Cellulitis and abscess of trunk 01/12/2015   Long term current use of insulin (HCC) 12/08/2013   Diabetes mellitus (HCC) 12/08/2013   Type 2 diabetes mellitus (HCC) 08/27/2013   HLD (hyperlipidemia) 08/27/2013   BP (high blood pressure) 08/27/2013   Past Medical History:  Past Medical History:  Diagnosis Date   Abscess of back    Arthritis    Bacterial infection 2015   Cancer (HCC)    skin    Diabetes mellitus without complication (HCC)    Dysrhythmia    Edema    RIGHT ANKLE   GERD (gastroesophageal reflux disease)    Hypertension    Trigeminal nerve disorder    Trigeminal neuralgia    Past Surgical History:  Past Surgical History:  Procedure Laterality Date   APPENDECTOMY  2010   CATARACT EXTRACTION W/PHACO Right 01/26/2017   Procedure: CATARACT EXTRACTION PHACO AND INTRAOCULAR LENS PLACEMENT (IOC);  Surgeon: [place] Crane, MD;  Location: [facility] ORS;  Service: Ophthalmology;  Laterality: Right;  Lot # [id number] H [place]: 00:48.2 AP%: 9.9 CDE: 5.35   I&D of back abscess     TRIGEMINAL NERVE DECOMPRESSION     HPI:  Patient is a 82 y.o. male who presented on 11/06 with episodes of confusion and rigors at home. PMH is significant for trigeminal neuralgia and HTN. ST ordered for cognitive-linguistic evaluation on 11/06.   Assessment / Plan / Recommendation Clinical Impression  Patient seen by SLP for cognitive-linguistic evaluation. Patient was awake,  alert, and cooperative throughout the session. He presents with mild-moderate cognitive impairments as evidenced by a score of 19/30 on the [facility] Mental Status (SLUMS) examination. He was orientated X 4 and demonstrated relative strengths in clock construction task and listening comprehension. Memory, attention, and problem solving were revealed to be areas of deficit. Patient's performance on spaced retrieval, divergent naming, and quantitative problem solving tasks were impaired. Confounding factors that may have exacerbated the severity of impairment include hearing loss, loud environment, and lack of rest. Patient reported no awareness of memory or attention deficits prior to this hospitalization. ST will continue to follow acutely to provide skilled intervention focused on functional cognitive gain to increase patient independence upon discharge.    SLP Assessment  SLP Recommendation/Assessment: Patient needs continued Speech Lanaguage Pathology Services SLP Visit Diagnosis: Cognitive communication deficit (R41.841)    Recommendations for follow up therapy are one component of a multi-disciplinary discharge planning process, led by the attending physician.  Recommendations may be updated based on patient status, additional functional criteria and insurance authorization.    Follow Up Recommendations   (tbd)    Assistance Recommended at Discharge  Intermittent Supervision/Assistance  Functional Status Assessment Patient has had a recent decline in their functional status and demonstrates the ability to make significant improvements in function in a reasonable and predictable amount of time.  Frequency and Duration min 2x/week  2 weeks      SLP Evaluation Cognition  Overall Cognitive Status: Impaired/Different from baseline Arousal/Alertness: Awake/alert Orientation Level: Oriented  X4 Attention: Focused;Selective Focused Attention: Impaired Focused Attention Impairment:  Verbal complex;Functional complex Selective Attention: Impaired Selective Attention Impairment: Verbal complex;Functional complex Memory: Impaired Memory Impairment: Decreased short term memory;Retrieval deficit Decreased Short Term Memory: Verbal basic;Functional basic Awareness: Appears intact       Comprehension  Auditory Comprehension Overall Auditory Comprehension: Appears within functional limits for tasks assessed Yes/No Questions: Not tested Commands: Not tested Conversation: Complex Interfering Components: Hearing EffectiveTechniques: Increased volume;Repetition Visual Recognition/Discrimination Discrimination: Within Function Limits Reading Comprehension Reading Status: Not tested    Expression Expression Primary Mode of Expression: Verbal Verbal Expression Overall Verbal Expression: Impaired Initiation: No impairment Automatic Speech: Name Level of Generative/Spontaneous Verbalization: Conversation Naming: Impairment Divergent: 50-74% accurate Pragmatics: No impairment Written Expression Dominant Hand: Right Written Expression: Not tested   Oral / Motor  Oral Motor/Sensory Function Overall Oral Motor/Sensory Function: Within functional limits Motor Speech Overall Motor Speech: Appears within functional limits for tasks assessed Respiration: Within functional limits Phonation: Normal Resonance: Within functional limits Articulation: Within functional limitis Intelligibility: Intelligible Motor Planning: Witnin functional limits Motor Speech Errors: Not applicable            Marline Backbone, Senaida Lange., Speech Therapy Student   01/05/2023, 12:41 PM

## 2023-01-05 NOTE — Progress Notes (Signed)
[facility] ADULT ICU REPLACEMENT PROTOCOL   The patient does apply for the [facility] Adult ICU Electrolyte Replacment Protocol based on the criteria listed below:   1.Exclusion criteria: TCTS, ECMO, Dialysis, and Myasthenia Gravis patients 2. Is GFR >/= 30 ml/min? Yes.    Patient's GFR today is 41 3. Is SCr </= 2? Yes.   Patient's SCr is 1.69 mg/dL 4. Did SCr increase >/= 0.5 in 24 hours? No. 5.Pt's weight >40kg  Yes.   6. Abnormal electrolyte(s):   K 3.6, Mg 1.5  7. Electrolytes replaced per protocol 8.  Call MD STAT for K+ </= 2.5, Phos </= 1, or Mag </= 1 Physician:  A. Florestine Avers R Kasee Hantz 01/05/2023 5:01 AM

## 2023-01-06 ENCOUNTER — Inpatient Hospital Stay (HOSPITAL_COMMUNITY): Payer: Medicare Other

## 2023-01-06 ENCOUNTER — Encounter (HOSPITAL_COMMUNITY): Payer: Self-pay | Admitting: Critical Care Medicine

## 2023-01-06 ENCOUNTER — Other Ambulatory Visit (HOSPITAL_COMMUNITY): Payer: Self-pay

## 2023-01-06 DIAGNOSIS — A419 Sepsis, unspecified organism: Secondary | ICD-10-CM | POA: Diagnosis not present

## 2023-01-06 DIAGNOSIS — G9341 Metabolic encephalopathy: Secondary | ICD-10-CM | POA: Diagnosis not present

## 2023-01-06 DIAGNOSIS — R6521 Severe sepsis with septic shock: Secondary | ICD-10-CM | POA: Diagnosis not present

## 2023-01-06 DIAGNOSIS — N179 Acute kidney failure, unspecified: Secondary | ICD-10-CM | POA: Diagnosis not present

## 2023-01-06 LAB — GLUCOSE, CAPILLARY
Glucose-Capillary: 192 mg/dL — ABNORMAL HIGH (ref 70–99)
Glucose-Capillary: 204 mg/dL — ABNORMAL HIGH (ref 70–99)
Glucose-Capillary: 203 mg/dL — ABNORMAL HIGH (ref 70–99)
Glucose-Capillary: 201 mg/dL — ABNORMAL HIGH (ref 70–99)
Glucose-Capillary: 202 mg/dL — ABNORMAL HIGH (ref 70–99)

## 2023-01-06 LAB — CBC
HCT: 28.7 % — ABNORMAL LOW (ref 39.0–52.0)
Hemoglobin: 9.1 g/dL — ABNORMAL LOW (ref 13.0–17.0)
MCH: 31.3 pg (ref 26.0–34.0)
MCHC: 31.7 g/dL (ref 30.0–36.0)
MCV: 98.6 fL (ref 80.0–100.0)
Platelets: 188 10*3/uL (ref 150–400)
RBC: 2.91 MIL/uL — ABNORMAL LOW (ref 4.22–5.81)
RDW: 12.7 % (ref 11.5–15.5)
WBC: 10.2 10*3/uL (ref 4.0–10.5)
nRBC: 0 % (ref 0.0–0.2)

## 2023-01-06 LAB — BASIC METABOLIC PANEL
Anion gap: 3 — ABNORMAL LOW (ref 5–15)
BUN: 23 mg/dL (ref 8–23)
CO2: 17 mmol/L — ABNORMAL LOW (ref 22–32)
Calcium: 7.3 mg/dL — ABNORMAL LOW (ref 8.9–10.3)
Chloride: 115 mmol/L — ABNORMAL HIGH (ref 98–111)
Creatinine, Ser: 1.47 mg/dL — ABNORMAL HIGH (ref 0.61–1.24)
GFR, Estimated: 48 mL/min — ABNORMAL LOW (ref >60)
Glucose, Bld: 212 mg/dL — ABNORMAL HIGH (ref 70–99)
Potassium: 4.2 mmol/L (ref 3.5–5.1)
Sodium: 135 mmol/L (ref 135–145)

## 2023-01-06 LAB — MAGNESIUM: Magnesium: 2.4 mg/dL (ref 1.7–2.4)

## 2023-01-06 LAB — HEPARIN LEVEL (UNFRACTIONATED)
Heparin Unfractionated: 0.2 [IU]/mL — ABNORMAL LOW (ref 0.30–0.70)
Heparin Unfractionated: 0.19 [IU]/mL — ABNORMAL LOW (ref 0.30–0.70)

## 2023-01-06 LAB — VDRL, CSF: VDRL Quant, CSF: NONREACTIVE

## 2023-01-06 MED ORDER — ACETAMINOPHEN 500 MG PO TABS
1000.0000 mg | ORAL_TABLET | Freq: Three times a day (TID) | ORAL | Status: DC
  Administered 2023-01-06 – 2023-01-09 (×8): 1000 mg via ORAL
  Filled 2023-01-06 (×9): qty 2

## 2023-01-06 MED ORDER — INSULIN ASPART 100 UNIT/ML IJ SOLN
3.0000 [IU] | Freq: Three times a day (TID) | INTRAMUSCULAR | Status: DC
  Administered 2023-01-06 – 2023-01-09 (×10): 3 [IU] via SUBCUTANEOUS

## 2023-01-06 MED ORDER — AMLODIPINE BESYLATE 5 MG PO TABS
5.0000 mg | ORAL_TABLET | Freq: Every day | ORAL | Status: DC
  Administered 2023-01-06 – 2023-01-09 (×4): 5 mg via ORAL
  Filled 2023-01-06 (×4): qty 1

## 2023-01-06 MED ORDER — TRAZODONE HCL 50 MG PO TABS
100.0000 mg | ORAL_TABLET | Freq: Every evening | ORAL | Status: DC | PRN
  Administered 2023-01-06: 100 mg via ORAL
  Filled 2023-01-06: qty 2

## 2023-01-06 MED ORDER — METRONIDAZOLE 500 MG PO TABS
500.0000 mg | ORAL_TABLET | Freq: Two times a day (BID) | ORAL | Status: DC
  Administered 2023-01-06 – 2023-01-09 (×6): 500 mg via ORAL
  Filled 2023-01-06 (×6): qty 1

## 2023-01-06 MED ORDER — METRONIDAZOLE 500 MG/100ML IV SOLN: 500.0000 mg | Freq: Two times a day (BID) | INTRAVENOUS | Status: DC

## 2023-01-06 MED ORDER — INSULIN ASPART 100 UNIT/ML IJ SOLN
0.0000 [IU] | Freq: Three times a day (TID) | INTRAMUSCULAR | Status: DC
Start: 2023-01-06 — End: 2023-01-09
  Administered 2023-01-06 – 2023-01-07 (×5): 3 [IU] via SUBCUTANEOUS
  Administered 2023-01-08 (×3): 1 [IU] via SUBCUTANEOUS
  Administered 2023-01-09: 4 [IU] via SUBCUTANEOUS

## 2023-01-06 NOTE — TOC Benefit Eligibility Note (Addendum)
Patient Product/process development scientist completed.    The patient is insured through [facility]. Patient has Medicare and is not eligible for a copay card, but may be able to apply for patient assistance, if available.    Ran test claim for Eliquis 5 mg and the current 30 day co-pay is $112.23 due to being in Coverage Gap (donut hole).  Ran test claim for Savaysa 30 mg and Not on Formulary   This test claim was processed through [facility]- copay amounts may vary at other pharmacies due to [employer], or as the patient moves through the different stages of their insurance plan.     Roland Earl, CPHT Pharmacy Technician III Certified Patient Advocate [facility] Patient Advocate Team Direct Number: [phone]  Fax: [phone]

## 2023-01-06 NOTE — Progress Notes (Signed)
PHARMACY - ANTICOAGULATION CONSULT NOTE  Pharmacy Consult for heparin Indication: pulmonary embolus  Labs: Recent Labs    01/04/23 0049 01/04/23 0101 01/04/23 0240 01/04/23 1352 01/04/23 2228 01/04/23 2328 01/05/23 0355 01/05/23 1141 01/06/23 0410  HGB 11.7*  --   --   --   --   --  9.8*  --  9.1*  HCT 35.1*  --   --   --   --   --  31.0*  --  28.7*  PLT 271  --   --   --   --   --  207  --  188  APTT  --  27  --   --   --   --   --   --   --   LABPROT  --  13.4  --   --   --   --   --   --   --   INR  --  1.0  --   --   --   --   --   --   --   HEPARINUNFRC  --   --   --  0.27*   < > 0.32  --  0.43 0.20*  CREATININE 1.34*  --   --   --   --   --  1.69*  --  1.47*  TROPONINIHS 47*  --  77* 3,448*  --   --  2,150*  --   --    < > = values in this interval not displayed.    Assessment: 82yo male subtherapeutic on heparin after two levels at goal; no infusion issues or signs of bleeding per RN.  Goal of Therapy:  Heparin level 0.3-0.7 units/ml   Plan:  Increase heparin infusion by 2 units/kg/hr to 1400 units/hr. Check level in 8 hours.   Vernard Gambles, PharmD, BCPS 01/06/2023 4:56 AM

## 2023-01-06 NOTE — Progress Notes (Addendum)
eLink Physician-Brief Progress Note Patient Name: Larry Lane DOB: [birth year] MRN: [id number]   Date of Service  01/06/2023  HPI/Events of Note  Insomnia  eICU Interventions  Trazodone PRN   0315 - adequate PO intake and overall positive fluid balance. DC IVF  Intervention Category Minor Interventions: Routine modifications to care plan (e.g. PRN medications for pain, fever)  Rema Lievanos 01/06/2023, 12:37 AM

## 2023-01-06 NOTE — Progress Notes (Signed)
PHARMACY - ANTICOAGULATION CONSULT NOTE  Pharmacy Consult for Heparin  Indication:  rule out pulmonary embolus  No Known Allergies  Patient Measurements: Height: 5\' 6"  (167.6 cm) Weight: 86.4 kg (190 lb 7.6 oz) IBW/kg (Calculated) : 63.8  Vital Signs: Temp: 98.7 F (37.1 C) (11/08 1649) Temp Source: Oral (11/08 1649) BP: 161/82 (11/08 1649) Pulse Rate: 88 (11/08 1649)  Labs: Recent Labs    01/04/23 0049 01/04/23 0101 01/04/23 0240 01/04/23 1352 01/04/23 2228 01/05/23 0355 01/05/23 1141 01/06/23 0410 01/06/23 1836  HGB 11.7*  --   --   --   --  9.8*  --  9.1*  --   HCT 35.1*  --   --   --   --  31.0*  --  28.7*  --   PLT 271  --   --   --   --  207  --  188  --   APTT  --  27  --   --   --   --   --   --   --   LABPROT  --  13.4  --   --   --   --   --   --   --   INR  --  1.0  --   --   --   --   --   --   --   HEPARINUNFRC  --   --   --  0.27*   < >  --  0.43 0.20* 0.19*  CREATININE 1.34*  --   --   --   --  1.69*  --  1.47*  --   TROPONINIHS 47*  --  77* 3,448*  --  2,150*  --   --   --    < > = values in this interval not displayed.    Estimated Creatinine Clearance: 42 mL/min (A) (by C-G formula based on SCr of 1.47 mg/dL (H)).   Assessment:  82 y/o M with altered mental status, fever, tachypnea. CT anigo with possible PE. Heparin for now. Above labs reviewed. PTA meds reviewed.   Heparin level remains subtherapeutic (0.19) on infusion at 1400 units/hr. No issues with line or bleeding reported per RN.   Goal of Therapy:  Heparin level 0.3-0.7 units/ml Monitor platelets by anticoagulation protocol: Yes   Plan:  Increase heparin drip to 1600 units/hr Will f/u 8hr heparin level  Christoper Fabian, PharmD, BCPS Please see amion for complete clinical pharmacist phone list 01/06/2023 7:30 PM

## 2023-01-06 NOTE — Progress Notes (Signed)
Presents with AMS, fever, abdominal pain,hx of DM2, HTN, HLD, trigeminal neuralgia, GERD, arthritis.   01/06/23 0820  TOC Brief Assessment  Insurance and Status Reviewed  Patient has primary care physician Yes  Home environment has been reviewed From home with spouse  Prior level of function: PTA independent with ADL's  Prior/Current Home Services No current home services  Social Determinants of Health Reivew SDOH reviewed no interventions necessary  Readmission risk has been reviewed No  Transition of care needs transition of care needs identified, TOC will continue to follow (PT/O recommending home health services and DME:tub shower seat)   TOC team following and  will assist with TOC needs... Gae Gallop RN, [place] [phone]

## 2023-01-06 NOTE — Progress Notes (Addendum)
NAME:  Larry Lane, MRN:  [id number], DOB:  Jul 18, 1940, LOS: 2 ADMISSION DATE:  01/04/2023, CONSULTATION DATE:  01/04/2023 REFERRING MD:  Larry Lane CHIEF COMPLAINT:  Hypotension   History of Present Illness:  Mr. Larry Lane is a 82 y/o gentleman with a history of trigeminal neuralgia, HTN who presented with episodes of confusion at home and rigors last night. He presented to the ED and was started on empiric ceftriaxone for unknown infection. He underwent LP today for rule out of encephalitis and meningitis after having a prolonged fever ~103 degrees today. Antibiotics were escalated with his fever. He has remained confused today. After his LP his BP has been lower with MAP 55-60. PCCM was consulted. He did not receive his home antihypertensives this morning.    Yesterday he had an appointment with his doctor about trigmeninal neuralgia, and the only med change made was increased carbamazepime from 3 to 4 times daily. Gabapentin dose remained the same.  Pertinent  Medical History  HTN  Trigeminal neuralgia  Significant Hospital Events: Including procedures, antibiotic start and stop dates in addition to other pertinent events   11/6 admitted, fevers, antibiotics esclated. LP done, peripheral pressors started after.  11/7 remains on pressors, intermittent fevers 11/8 off pressors   Interim History / Subjective:  He is still having left anterior leg pain with associated weakness due to the pain.  This is not worsening but it is bothersome to him.  He walked with PT yesterday using a rolling walker without significant issue.  Continues to have good motor and sensory function in the remainder of his leg.  Objective   Blood pressure (!) 141/66, pulse 96, temperature 98.5 F (36.9 C), temperature source Oral, resp. rate 18, height 5\' 6"  (1.676 m), weight 86.4 kg, SpO2 100%.        Intake/Output Summary (Last 24 hours) at 01/06/2023 0608 Last data filed at 01/06/2023 0601 Gross per 24 hour   Intake 4524.83 ml  Output 2050 ml  Net 2474.83 ml   Filed Weights   01/04/23 0042 01/04/23 1745 01/06/23 0500  Weight: 84 kg 83.8 kg 86.4 kg    Examination: Constitutional:slightly tired but comfortably appearing elderly male. In no acute distress. HENT: Normocephalic, atraumatic Cardio:Regular rate and rhythm.  2+ bilateral radial and dorsalis pedis  pulses. Pulm:Clear to auscultation bilaterally. Normal work of breathing on room air. Abdomen: Soft, non-tender, non-distended, positive bowel sounds. WUJ:[id number] for extremity edema. Skin:Warm and dry. Neuro:Alert and oriented x3. Intact strength and sensation in bilateral UE. Intact sensation in bilateral LE. Pain limiting hip strength testing in left hip, otherwise intact strength in bilateral LE. CN II-XII intact.  Psych:Pleasant mood and affect.   Resolved Hospital Problem list   Acute metabolic encephalopathy Early SBO  Assessment & Plan:  Septic Shock, resolved Likely culture negative sepsis with still unclear source, CSF studies notable for mildly elevated protein but no wbcs or organisms and men/enc panel negative. Empirically started acyclovir, ampicillin, ceftriaxone, and vancomycin on admission. d/c acyclovir and ampicillin with negative HSV and listeria  McConnell's sign on TTE with PE on CTA so some concerns for cardiogenic shock but most consistent  Continues to have good perfusion and is off norepinephrine since yesterday at 2 PM -continue ceftriaxone and vancomycin -monitor fevers and blood/CSF cultures  Likely pulmonary embolism with troponin elevation sub optimal chest CTA with possible LUL and LLL pulmonary emboli TTE shows RV dysfunction consistent with McConnell's sign, LV is hyperdynamic Troponin peak at 3500 with down trend  without ischemic EKG changes S1Q3T3 on admission EKG, improved on repeat No DVT on LEVD -transition to doac, edoxaban PA needed, contraindications to eliquis and xarelto with  Carbamezapine  -continue on heparin drip for now  AKI 2/2 hypotension NAGMA, hyperchloremic metabolic acidosis, improving Hypomagnesemia Baseline creatinine 0.9 with rise to 1.69 and down to 1.47 today.  -encourage PO hydration  -daily BMP/mg/phos, replete for K>4, Mg>2 -monitor UOP  Left hip pain and weakness Remains stable from yesterday. Most concerned about the pain but it is not radicular and there is no overlying skin changes.  Exam is reassuring for septic joint, neuromuscular weakness, or lumbar radiculopathy. Most consistent with MSK injury or OA. CT abd/pel w/ contrast did not show any signs of joint effusion or inflammation.  -tylenol scheduled and oxy prn -PT  -monitor for new or worsening symptoms -xr hip/femur/knee to r/u fx  Hypovolemic hyponatremia  -PO hydration and solute intake -daily BMP   DM A1c 7.6 here -SSI  HTN Home meds include amlodipine and losartan - resume amlodipine  Anemia - CTM, transfuse <7  Trigeminal Neuralgia Home meds of carbamazepine with recent increase in dose and gabapentin -continue home meds  Stable for transfer to med tele.  Best Practice (right click and "Reselect all SmartList Selections" daily)   Diet/type: Regular consistency (see orders) DVT prophylaxis: systemic heparin GI prophylaxis: PPI Lines: N/A Foley:  N/A Code Status:  full code Last date of multidisciplinary goals of care discussion [01/06/2023]  Labs   CBC: Recent Labs  Lab 01/04/23 0049 01/05/23 0355 01/06/23 0410  WBC 8.0 19.8* 10.2  NEUTROABS 7.6  --   --   HGB 11.7* 9.8* 9.1*  HCT 35.1* 31.0* 28.7*  MCV 93.6 98.1 98.6  PLT 271 207 188    Basic Metabolic Panel: Recent Labs  Lab 01/04/23 0049 01/05/23 0355 01/06/23 0410  NA 132* 133* 135  K 3.9 3.6 4.2  CL 103 110 115*  CO2 19* 15* 17*  GLUCOSE 182* 220* 212*  BUN 21 26* 23  CREATININE 1.34* 1.69* 1.47*  CALCIUM 8.0* 6.7* 7.3*  MG  --  1.5* 2.4  PHOS  --  3.8  --     GFR: Estimated Creatinine Clearance: 42 mL/min (A) (by C-G formula based on SCr of 1.47 mg/dL (H)). Recent Labs  Lab 01/04/23 0049 01/04/23 0119 01/04/23 0307 01/04/23 1352 01/04/23 1834 01/04/23 2228 01/05/23 0355 01/06/23 0410  WBC 8.0  --   --   --   --   --  19.8* 10.2  LATICACIDVEN  --    < > 2.0* 1.7 1.9 1.3  --   --    < > = values in this interval not displayed.    Liver Function Tests: Recent Labs  Lab 01/04/23 0049  AST 40  ALT 29  ALKPHOS 65  BILITOT 0.7  PROT 5.5*  ALBUMIN 3.1*   No results for input(s): "LIPASE", "AMYLASE" in the last 168 hours. No results for input(s): "AMMONIA" in the last 168 hours.  ABG No results found for: "PHART", "PCO2ART", "PO2ART", "HCO3", "TCO2", "ACIDBASEDEF", "O2SAT"   Coagulation Profile: Recent Labs  Lab 01/04/23 0101  INR 1.0    Cardiac Enzymes: No results for input(s): "CKTOTAL", "CKMB", "CKMBINDEX", "TROPONINI" in the last 168 hours.  HbA1C: Hgb A1c MFr Bld  Date/Time Value Ref Range Status  01/04/2023 01:03 AM 7.6 (H) 4.8 - 5.6 % Final    Comment:    (NOTE) Pre diabetes:  5.7%-6.4%  Diabetes:              >6.4%  Glycemic control for   <7.0% adults with diabetes     CBG: Recent Labs  Lab 01/05/23 0342 01/05/23 0722 01/05/23 1059 01/05/23 1613 01/05/23 2040  GLUCAP 128* 166* 178* 98 162*    Review of Systems:   Left hip pain.  Past Medical History:  He,  has a past medical history of Abscess of back, Arthritis, Bacterial infection (2015), Cancer (HCC), Diabetes mellitus without complication (HCC), Dysrhythmia, Edema, GERD (gastroesophageal reflux disease), Hypertension, Trigeminal nerve disorder, and Trigeminal neuralgia.   Surgical History:   Past Surgical History:  Procedure Laterality Date   APPENDECTOMY  2010   CATARACT EXTRACTION W/PHACO Right 01/26/2017   Procedure: CATARACT EXTRACTION PHACO AND INTRAOCULAR LENS PLACEMENT (IOC);  Surgeon: Nevada Crane, MD;   Location: [facility] ORS;  Service: Ophthalmology;  Laterality: Right;  Lot # [id number] H [place]: 00:48.2 AP%: 9.9 CDE: 5.35   I&D of back abscess     TRIGEMINAL NERVE DECOMPRESSION       Social History:   reports that he has never smoked. He has never used smokeless tobacco. He reports that he does not drink alcohol and does not use drugs.   Family History:  His family history includes Heart disease in his father.   Allergies No Known Allergies   Home Medications  Prior to Admission medications   Medication Sig Start Date End Date Taking? Authorizing Provider  acetaminophen (TYLENOL) 500 MG tablet Take 1,000 mg by mouth every 6 (six) hours as needed.   Yes [provider]  amLODipine (NORVASC) 5 MG tablet Take 1 tablet by mouth daily. 05/23/22  Yes [provider]  Apoaequorin 10 MG CAPS Take 1 capsule by mouth daily. Prevagen   Yes [provider]  aspirin EC 81 MG tablet Take 325 mg by mouth daily.   Yes [provider]  carbamazepine (CARBATROL) 200 MG 12 hr capsule Take 200 mg by mouth 4 (four) times daily.   Yes [provider]  cholestyramine light (PREVALITE) 4 GM/DOSE powder Take 4 g by mouth 2 (two) times daily with a meal. 12/01/22  Yes [provider]  gabapentin (NEURONTIN) 600 MG tablet Take 600 mg by mouth 3 (three) times daily. 12/19/22  Yes [provider]  glucagon (GLUCAGON EMERGENCY) 1 MG injection Inject 1 mg into the skin. 06/18/13  Yes [provider]  insulin lispro (HUMALOG) 100 UNIT/ML injection Inject 20 Units into the skin once. Inject 20 units once per day; can bolus up to 56 units once a day as directed via VGO Pump   Yes [provider]  losartan (COZAAR) 50 MG tablet Take 50 mg daily by mouth.   Yes [provider]  Multiple Vitamin (MULTI-VITAMIN) tablet Take 1 tablet by mouth daily.   Yes [provider]  omeprazole (PRILOSEC) 20 MG capsule Take 20 mg by mouth daily.    Yes [provider]  rosuvastatin (CRESTOR) 10 MG tablet Take 1 tablet by mouth daily. 11/24/22  Yes [provider]     Critical care time: 2    Rocky Morel, DO Internal Medicine Resident, PGY-2 Pager# [phone] [facility] 01/06/2023 6:08 AM  For contact information, see Amion. If no response to pager, please call PCCM consult pager. After hours, 7PM- 7AM, please call Elink.

## 2023-01-06 NOTE — Plan of Care (Signed)

## 2023-01-06 NOTE — Progress Notes (Addendum)
Larry Lane is a 82 y/o gentleman with a history of HTN, trigeminal neuralgia who presented with confusion and fevers, found to have septic shock. Today he still has left leg/ thigh pain- worse with movement, some at rest.   BP (!) 140/68   Pulse 82   Temp 98.2 F (36.8 C) (Oral)   Resp 16   Ht 5\' 6"  (1.676 m)   Wt 86.4 kg   SpO2 100%   BMI 30.74 kg/m  Ill appearing man lying in bed in NAD Awake, alert, answering questions appropriately. Moving all extremities.  S1S2, RRR Abd soft, NT Left leg- no rash, no edema or bruising. Able to flex hip. No knee warmth, erythema, or edema. Has well healed TKA scar.  Skin warm, dry, no rashes.  Bicarb 17 BUN 23 Cr 1.47 WBC 10.2 H/H 9.1/28.7  Blood cultures: NGTD CSF culture: NGTD  Assessment & plan: Septic shock; shock resolved. Most likely due to ileitis; no other sources have been identified.  -stop vanc with negative cultures at 48h; con't ceftriaxone and flagyl -follow cultures until they are finalized -off pressors today  HTN -can resume amlodipine today -hold losartan   AKI, improving -strict I/O  -renally dose meds,   Acute submassive PE; unprovoked. 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  -switch to eliquis  Left leg pain; no evidence of joint inflammation or myositis around hip on CT including his pelvis. No history of trauma. Suspect pulled muscle with pain most significant with movement, much better at rest. -scheduled tylenol -topical lidocaine -oxycodone PRN -hip, knee, femur xrays -con't working with PT  DM, uncontrolled hyperglycemia -increase mealtime insulin to 3 units TIDAC -SSI PRN -con't semglee 5 units daily -goal BG 140-180  HLD -con't PTA statin  Trigeminal neuralgia -con't PTA gabapentin and carbamazepine  Acute metabolic encephalopathy due to sepsis resolved -PT, OT -remove PICC  Transfer to the floor today. TRH to reassume care tomorrow.    Steffanie Dunn, DO 01/06/23 9:10 AM [facility]  For contact information, see Amion. If no response to pager, please call PCCM consult pager. After hours, 7PM- 7AM, please call Elink.

## 2023-01-06 NOTE — Progress Notes (Addendum)
Physical Therapy Treatment Patient Details Name: Larry Lane MRN: [id number] DOB: 1940/04/20 Today's Date: 01/06/2023   History of Present Illness 82 y/o gentleman who presented 11/6 with episodes of confusion at home and rigors; Septic shock concern for meningitis, Acute metabolic encephalopathy, Troponin elevation concern for PE; submassive PE+.  With a history of trigeminal neuralgia, HTN    PT Comments  Notable decline in function and cognition from prior visit with pt yesterday. Disoriented to location and situation, a bit anxious and restless. Urinary incontinence, malodorous, assisted with pericare. Pt with additional weakness left hip flexion and knee extension Mod assist to stand and take small lateral steps along bed with Lt knee buckling, unable to bear weight due to weakness. Unable to walk this date due to LLE weakness and confusion. Wife in room, very supportive. Pain now radiates from anterior thigh to distal shin anteriorly. Recommendations updated for post acute rehab due to level of difficulty with mobility. Will update as appropriate and continue to follow and progress acutely. Patient will benefit from intensive inpatient follow up therapy, >3 hours/day. Patient will continue to benefit from skilled physical therapy services to further improve independence with functional mobility.    If plan is discharge home, recommend the following: A lot of help with walking and/or transfers;Assistance with cooking/housework;Direct supervision/assist for medications management;Direct supervision/assist for financial management;Assist for transportation;Help with stairs or ramp for entrance;A lot of help with bathing/dressing/bathroom;Supervision due to cognitive status   Can travel by private vehicle     No  Equipment Recommendations  None recommended by PT (TBD next venue)    Recommendations for Other Services Rehab consult     Precautions / Restrictions Precautions Precautions:  Fall Restrictions Weight Bearing Restrictions: No     Mobility  Bed Mobility Overal bed mobility: Needs Assistance Bed Mobility: Supine to Sit, Sit to Supine     Supine to sit: Used rails, Min assist, HOB elevated Sit to supine: Min assist   General bed mobility comments: Min assist to rise to EOB, max VC, supported LLE into and out of bed. Requires extra time. Increased Lt thigh pain while sitting unless he unload Lt leg.    Transfers Overall transfer level: Needs assistance Equipment used: Rolling walker (2 wheels) Transfers: Sit to/from Stand, Bed to chair/wheelchair/BSC Sit to Stand: Mod assist   Step pivot transfers: Mod assist       General transfer comment: Mod assist for boost to stand, limited WB through LLE. Lt knee buckles. Poor awareness. Cues for RW for support. Step pivot towards left along bed, requried mod assist for RW control and Lt knee block due to buckling.    Ambulation/Gait               General Gait Details: Deferred due to confusion and Lt knee buckling   Stairs             Wheelchair Mobility     Tilt Bed    Modified Rankin (Stroke Patients Only)       Balance Overall balance assessment: Needs assistance Sitting-balance support: No upper extremity supported, Feet supported Sitting balance-Leahy Scale: Fair     Standing balance support: Bilateral upper extremity supported, Reliant on assistive device for balance Standing balance-Leahy Scale: Poor Standing balance comment: Lt knee buckles                            Cognition Arousal: Alert Behavior During Therapy: Anxious Overall Cognitive  Status: Impaired/Different from baseline Area of Impairment: Memory, Following commands, Problem solving, Orientation, Safety/judgement                 Orientation Level: Disoriented to, Place, Situation   Memory: Decreased short-term memory Following Commands: Follows one step commands with increased  time Safety/Judgement: Decreased awareness of safety, Decreased awareness of deficits   Problem Solving: Difficulty sequencing, Requires verbal cues, Slow processing, Decreased initiation, Requires tactile cues General Comments: Oriented to self, month and year. Unsure of location, situation.        Exercises      General Comments General comments (skin integrity, edema, etc.): Distal strength preserved in ankles, no change since yesterday, however Lt knee and hip noticably weaker, unable to lift LLE from bed and buckles when attempting to bear weight.      Pertinent Vitals/Pain Pain Assessment Pain Assessment: Faces Faces Pain Scale: Hurts even more Pain Location: Lt anterior thigh, radiating down anterior shin to ankle now Pain Descriptors / Indicators: Sore, Radiating Pain Intervention(s): Monitored during session, Repositioned, Limited activity within patient's tolerance    Home Living                          Prior Function            PT Goals (current goals can now be found in the care plan section) Acute Rehab PT Goals Patient Stated Goal: Get well PT Goal Formulation: With patient Time For Goal Achievement: 01/19/23 Potential to Achieve Goals: Good Progress towards PT goals: Progressing toward goals    Frequency    Min 1X/week      PT Plan      Co-evaluation              AM-PAC PT "6 Clicks" Mobility   Outcome Measure  Help needed turning from your back to your side while in a flat bed without using bedrails?: A Little Help needed moving from lying on your back to sitting on the side of a flat bed without using bedrails?: A Lot Help needed moving to and from a bed to a chair (including a wheelchair)?: A Lot Help needed standing up from a chair using your arms (e.g., wheelchair or bedside chair)?: A Lot Help needed to walk in hospital room?: Total Help needed climbing 3-5 steps with a railing? : Total 6 Click Score: 11    End of  Session Equipment Utilized During Treatment: Gait belt Activity Tolerance: Other (comment) (weakness) Patient left: in bed;with call bell/phone within reach;with bed alarm set;with family/visitor present (Chair mode) Nurse Communication: Mobility status PT Visit Diagnosis: Unsteadiness on feet (R26.81);Other abnormalities of gait and mobility (R26.89);Muscle weakness (generalized) (M62.81);Difficulty in walking, not elsewhere classified (R26.2);Pain Pain - Right/Left: Left Pain - part of body:  (thigh and leg, anteriorly)     Time: 1610-9604 PT Time Calculation (min) (ACUTE ONLY): 28 min  Charges:    $Therapeutic Activity: 23-37 mins PT General Charges $$ ACUTE PT VISIT: 1 Visit                     Kathlyn Sacramento, PT, DPT [facility] Services Physical Therapist Office: [phone] Website: [email or web address]    Berton Mount 01/06/2023, 5:14 PM

## 2023-01-07 ENCOUNTER — Other Ambulatory Visit: Payer: Self-pay

## 2023-01-07 ENCOUNTER — Encounter (HOSPITAL_COMMUNITY): Payer: Self-pay | Admitting: Critical Care Medicine

## 2023-01-07 DIAGNOSIS — A419 Sepsis, unspecified organism: Secondary | ICD-10-CM | POA: Diagnosis not present

## 2023-01-07 DIAGNOSIS — R6521 Severe sepsis with septic shock: Secondary | ICD-10-CM | POA: Diagnosis not present

## 2023-01-07 DIAGNOSIS — I2609 Other pulmonary embolism with acute cor pulmonale: Secondary | ICD-10-CM

## 2023-01-07 DIAGNOSIS — K529 Noninfective gastroenteritis and colitis, unspecified: Secondary | ICD-10-CM | POA: Diagnosis not present

## 2023-01-07 LAB — BASIC METABOLIC PANEL
Anion gap: 8 (ref 5–15)
BUN: 14 mg/dL (ref 8–23)
CO2: 18 mmol/L — ABNORMAL LOW (ref 22–32)
Calcium: 8.3 mg/dL — ABNORMAL LOW (ref 8.9–10.3)
Chloride: 112 mmol/L — ABNORMAL HIGH (ref 98–111)
Creatinine, Ser: 1.02 mg/dL (ref 0.61–1.24)
GFR, Estimated: >60 mL/min (ref >60)
Glucose, Bld: 180 mg/dL — ABNORMAL HIGH (ref 70–99)
Potassium: 3.6 mmol/L (ref 3.5–5.1)
Sodium: 138 mmol/L (ref 135–145)

## 2023-01-07 LAB — GLUCOSE, CAPILLARY
Glucose-Capillary: 208 mg/dL — ABNORMAL HIGH (ref 70–99)
Glucose-Capillary: 208 mg/dL — ABNORMAL HIGH (ref 70–99)
Glucose-Capillary: 207 mg/dL — ABNORMAL HIGH (ref 70–99)
Glucose-Capillary: 149 mg/dL — ABNORMAL HIGH (ref 70–99)

## 2023-01-07 LAB — MAGNESIUM: Magnesium: 2.1 mg/dL (ref 1.7–2.4)

## 2023-01-07 LAB — CBC
HCT: 26.4 % — ABNORMAL LOW (ref 39.0–52.0)
Hemoglobin: 8.6 g/dL — ABNORMAL LOW (ref 13.0–17.0)
MCH: 30.6 pg (ref 26.0–34.0)
MCHC: 32.6 g/dL (ref 30.0–36.0)
MCV: 94 fL (ref 80.0–100.0)
Platelets: 193 10*3/uL (ref 150–400)
RBC: 2.81 MIL/uL — ABNORMAL LOW (ref 4.22–5.81)
RDW: 12.5 % (ref 11.5–15.5)
WBC: 7.2 10*3/uL (ref 4.0–10.5)
nRBC: 0 % (ref 0.0–0.2)

## 2023-01-07 LAB — HEPARIN LEVEL (UNFRACTIONATED): Heparin Unfractionated: 0.3 [IU]/mL (ref 0.30–0.70)

## 2023-01-07 MED ORDER — WARFARIN SODIUM 5 MG PO TABS
5.0000 mg | ORAL_TABLET | Freq: Once | ORAL | Status: AC
  Administered 2023-01-07: 5 mg via ORAL
  Filled 2023-01-07: qty 1

## 2023-01-07 MED ORDER — WARFARIN - PHARMACIST DOSING INPATIENT: Freq: Every day | Status: DC

## 2023-01-07 NOTE — Progress Notes (Signed)
Inpatient Rehab Admissions Coordinator:    I met with pt. And wife to discuss potential CIR admit. They are interested and wife can provide 24/7 support at d/c. Pt. Will need OT to see and update rec to AIR and then I will send case to insurance.   Megan Salon, MS, CCC-SLP Rehab Admissions Coordinator  [phone] (celll) [phone] (office)

## 2023-01-07 NOTE — Plan of Care (Signed)
  Problem: Education: Goal: Ability to describe self-care measures that may prevent or decrease complications (Diabetes Survival Skills Education) will improve Outcome: Progressing   Problem: Coping: Goal: Ability to adjust to condition or change in health will improve Outcome: Progressing   Problem: Skin Integrity: Goal: Risk for impaired skin integrity will decrease Outcome: Progressing   

## 2023-01-07 NOTE — Progress Notes (Signed)
PROGRESS NOTE    Larry Lane  ION:[id number] DOB: 22-Jan-1941 DOA: 01/04/2023 PCP: Jerl Mina, MD    Brief Narrative:   Larry Lane is a 82 y.o. male with past medical history significant for HTN, HLD, DM2, trigeminal neuralgia, GERD, osteoarthritis who presented to [facility] ED on 01/04/2023 with complaints of abdominal pain.  Spouse also noted patient seemed confused with rigors night prior to ED presentation.  Denied urinary symptoms, no nausea/vomiting/diarrhea.  Initially admitted to the hospitalist service but significant decompensation with low blood pressure, tachycardia, elevated fever prompted referral to pulmonary/critical care medicine and ultimately was admitted to the intensive care unit same day.  Initial workup in the ED with temperature 103.0 F, HR 118, BP 74/62, SpO2 95% on room air.  WBC 8.0, hemoglobin 11.7, platelet count 271.  Sodium 132, potassium 3.9, chloride 103, CO2 19, glucose 182, BUN 21, creatinine 1.34.  AST 40, ALT 29, total bilirubin 0.7.  High sensitivity troponin 47>77.  INR 1.0.  Urinalysis unrevealing.  Chest x-ray with no acute cardiopulmonary disease process.  CT head without contrast with no acute intracranial [name], noted right suboccipital craniotomy with focal encephalomalacia within right cerebellar hemisphere.  CT angiogram chest with questionable left upper lobe and left lower lobe pulmonary emboli, RV mildly distended consistent with right heart strain, coronary artery calcifications, aortic atherosclerosis.  CT abdomen/pelvis segmental thickening walls of the terminal ileum possible infectious versus inflammatory ileitis with dilation of the small bowel proximal to the segment, small hiatal hernia.  Blood cultures x 2 were obtained.  Patient was started on empiric antibiotics.  Initially admitted to the hospital service but given decompensation was transferred to the ICU same day due to septic shock with requirement of vasopressors.  Significant  Hospital events: 11/6: Initially admitted to hospitalist service, transferred to ICU for septic shock requiring vasopressors, underwent lumbar puncture with CSF analysis unrevealing. 11/7: Continues to require vasopressors, intermittent fevers 11/8: Vasopressors titrated off; left knee x-ray unrevealing 11/9: Transferred back to hospitalist service   Assessment & Plan:   Septic shock 2/2 ileitis Patient presenting to ED with abdominal pain.  Noted to be febrile, tachycardic, hypotensive on admission.  Initially requiring vasopressor support and admitted to the intensive care unit.  Patient was started on empiric antibiotics due to suspected ileitis.  Underwent a lumbar puncture due to confusion/encephalopathy which was unrevealing.  Chest x-ray/CT angiogram chest negative for focal consolidation.  Left knee x-ray with no acute finding.  Urinalysis unrevealing.  MRSA PCR negative. -- Blood cultures x 2: No growth x 2 days -- Culture CSF: No growth x 2 days -- Ceftriaxone 2 g IV every 24 hours -- Metronidazole 500 mg PO every 12 hours -- Supportive care  Elevated troponin secondary to type II demand ischemia High sensitive troponin elevated at 47>77> 3448> 2150.  Etiology likely secondary to type II demand ischemia in the setting of septic shock as above from underlying ileitis versus submassive pulmonary embolism.  Acute renal failure Patient presenting with a creatinine of 1.34, peaked at 1.69.  Etiology likely secondary to prerenal azotemia in the setting of dehydration and poor oral intake in the days preceding hospitalization versus ATN from septic shock/hypotension.  Supported with IV fluid hydration, vasopressors. -- Cr 1.34>>1.69>1.47>1.02 --Holding home losartan -- Continue encourage increased oral intake -- Avoid nephrotoxins, renal dose all medications -- BMP daily  Submassive pulmonary embolism CT angiogram chest with left upper lobe/left lower lobe pulmonary emboli, RV mildly  distended consistent with right heart  strain.  Troponin elevated.  TTE with LVEF 70 either 75%, LV with no regional wall motion abnormalities, RV systolic function mildly reduced, hypokinetic segments with hyperdynamic apex consistent with McConnell sign, RV size moderately enlarged, mild TR, IVC normal in size. -- Continue heparin drip, pharmacy consulted for dosing/monitoring -- Unable to transition to NOAC/DOAC/enoxaparin due to interactions with carbamazepine -- Start warfarin today, goal INR 2-3; will need to maintain heparin drip until INR therapeutic  Acute metabolic encephalopathy: Resolved Patient presenting to ED with confusion, notably secondary to septic shock with hypoperfusion as above.  CT head, CSF analysis unrevealing.  Mental status now back to his typical baseline. -- Supportive care  Type 2 diabetes mellitus with hyperglycemia Hemoglobin A1c 7.6, not optimally controlled.  Home regimen includes Humalog 20 units once daily -- Semglee 5 units [place] daily -- NovoLog 3 units TIDAC -- SSI for coverage -- CBGs qAC/HS  Essential hypertension Home regimen includes amlodipine 5 mg p.o. daily, losartan 50 mg p.o. daily. -- Amlodipine 5 mg p.o. daily -- Continue to hold home losartan for now  Hyperlipidemia -- Crestor 10 mg p.o. daily  Trigeminal neuralgia -- Carbamazepine 200 mg p.o. 3 times daily -- Gabapentin 600 mg p.o. 3 times daily  GERD -- Protonix 40 mg p.o. daily (substituted for home omeprazole)  Weakness/debility/deconditioning/gait disturbance Seen by PT with recommendation of CIR admission on discharge -- rehab MD consult   DVT prophylaxis: SCDs Start: 01/04/23 1657 warfarin (COUMADIN) tablet 5 mg    Code Status: Full Code Family Communication:   Disposition Plan:  Level of care: Telemetry Medical Status is: Inpatient Remains inpatient appropriate because: Heparin drip, starting warfarin today for pulmonary embolism    Consultants:  PCCM - signed off  11/9  Procedures:  Lumbar puncture  Antimicrobials:  Vancomycin 11/6 - 11/7 Ceftriaxone 11/5>> Metronidazole 11/7>> Ampicillin 01/03/10/7 Acyclovir 01/03/10/6   Subjective: Patient seen examined bedside, resting calmly.  Lying in bed.  Spouse present.  Continues with poor appetite.  Confusion now resolved and mental status back at baseline.  Discussed with patient and spouse regarding initiation of warfarin given limited options due to his home carbamazepine use.  Patient reports he was not able to do much with physical therapy yesterday due to feeling "fatigued".  Spouse reports he did much better 2 days ago.  Seen by PT with recommendation of CIR on discharge.  Continues on IV antibiotics.  Cultures remain negative today.  No other specific questions or concerns at this time.  Denies headache, no dizziness, no chest pain, no palpitations, no shortness of breath, no current abdominal pain, no current fever, no chills/night sweats, no nausea/vomiting/diarrhea, no focal weakness, no cough/congestion, no paresthesias.  No acute events overnight per nursing staff.  Objective: Vitals:   01/07/23 0422 01/07/23 0623 01/07/23 0636 01/07/23 0743  BP: (!) 169/78 (!) 164/81 (!) 164/81 (!) 143/69  Pulse: 76 73  94  Resp:    19  Temp: 97.7 F (36.5 C)   98.6 F (37 C)  TempSrc: Oral   Oral  SpO2: 96% 98%  99%  Weight:  85.7 kg    Height:        Intake/Output Summary (Last 24 hours) at 01/07/2023 0949 Last data filed at 01/07/2023 0825 Gross per 24 hour  Intake 623.75 ml  Output 400 ml  Net 223.75 ml   Filed Weights   01/04/23 1745 01/06/23 0500 01/07/23 0623  Weight: 83.8 kg 86.4 kg 85.7 kg    Examination:  Physical Exam:  GEN: NAD, alert and oriented x 3, elderly, ill in appearance HEENT: NCAT, PERRL, EOMI, sclera clear, MMM PULM: CTAB w/o wheezes/crackles, normal respiratory effort, on room air CV: RRR w/o M/G/R GI: abd soft, NTND, NABS, no R/G/M MSK: no peripheral edema, muscle  strength globally intact 5/5 bilateral upper/lower extremities NEURO: CN II-XII intact, no focal deficits, sensation to light touch intact PSYCH: normal mood/affect Integumentary: No concerning rashes/lesions/wounds noted on exposed skin surfaces    Data Reviewed: I have personally reviewed following labs and imaging studies  CBC: Recent Labs  Lab 01/04/23 0049 01/05/23 0355 01/06/23 0410 01/07/23 0503  WBC 8.0 19.8* 10.2 7.2  NEUTROABS 7.6  --   --   --   HGB 11.7* 9.8* 9.1* 8.6*  HCT 35.1* 31.0* 28.7* 26.4*  MCV 93.6 98.1 98.6 94.0  PLT 271 207 188 193   Basic Metabolic Panel: Recent Labs  Lab 01/04/23 0049 01/05/23 0355 01/06/23 0410 01/07/23 0503  NA 132* 133* 135 138  K 3.9 3.6 4.2 3.6  CL 103 110 115* 112*  CO2 19* 15* 17* 18*  GLUCOSE 182* 220* 212* 180*  BUN 21 26* 23 14  CREATININE 1.34* 1.69* 1.47* 1.02  CALCIUM 8.0* 6.7* 7.3* 8.3*  MG  --  1.5* 2.4 2.1  PHOS  --  3.8  --   --    GFR: Estimated Creatinine Clearance: 60.3 mL/min (by C-G formula based on SCr of 1.02 mg/dL). Liver Function Tests: Recent Labs  Lab 01/04/23 0049  AST 40  ALT 29  ALKPHOS 65  BILITOT 0.7  PROT 5.5*  ALBUMIN 3.1*   No results for input(s): "LIPASE", "AMYLASE" in the last 168 hours. No results for input(s): "AMMONIA" in the last 168 hours. Coagulation Profile: Recent Labs  Lab 01/04/23 0101  INR 1.0   Cardiac Enzymes: No results for input(s): "CKTOTAL", "CKMB", "CKMBINDEX", "TROPONINI" in the last 168 hours. BNP (last 3 results) No results for input(s): "PROBNP" in the last 8760 hours. HbA1C: No results for input(s): "HGBA1C" in the last 72 hours. CBG: Recent Labs  Lab 01/06/23 1100 01/06/23 1601 01/06/23 2026 01/06/23 2247 01/07/23 0806  GLUCAP 204* 203* 201* 202* 208*   Lipid Profile: No results for input(s): "CHOL", "HDL", "LDLCALC", "TRIG", "CHOLHDL", "LDLDIRECT" in the last 72 hours. Thyroid Function Tests: No results for input(s): "TSH",  "T4TOTAL", "FREET4", "T3FREE", "THYROIDAB" in the last 72 hours. Anemia Panel: No results for input(s): "VITAMINB12", "FOLATE", "FERRITIN", "TIBC", "IRON", "RETICCTPCT" in the last 72 hours. Sepsis Labs: Recent Labs  Lab 01/04/23 0307 01/04/23 1352 01/04/23 1834 01/04/23 2228  LATICACIDVEN 2.0* 1.7 1.9 1.3    Recent Results (from the past 240 hour(s))  Blood Culture (routine x 2)     Status: None (Preliminary result)   Collection Time: 01/04/23  1:01 AM   Specimen: BLOOD  Result Value Ref Range Status   Specimen Description BLOOD SITE NOT SPECIFIED  Final   Special Requests   Final    BOTTLES DRAWN AEROBIC AND ANAEROBIC Blood Culture results may not be optimal due to an excessive volume of blood received in culture bottles   Culture   Final    NO GROWTH 3 DAYS Performed at [facility], [address] [place], [place] [address]    Report Status PENDING  Incomplete  Blood Culture (routine x 2)     Status: None (Preliminary result)   Collection Time: 01/04/23  2:17 AM   Specimen: BLOOD  Result Value Ref Range Status  Specimen Description BLOOD BLOOD LEFT ARM  Final   Special Requests   Final    BOTTLES DRAWN AEROBIC AND ANAEROBIC Blood Culture adequate volume   Culture   Final    NO GROWTH 3 DAYS Performed at [facility], [address], [place], [place] [address]    Report Status PENDING  Incomplete  SARS Coronavirus 2 by RT PCR (hospital order, performed in [facility] hospital lab) *cepheid single result test* Anterior Nasal Swab     Status: None   Collection Time: 01/04/23  3:23 AM   Specimen: Anterior Nasal Swab  Result Value Ref Range Status   SARS Coronavirus 2 by RT PCR NEGATIVE NEGATIVE Final    Comment: Performed at [facility], [address], [place], [place] [address]  CSF culture w Gram Stain     Status: None (Preliminary result)   Collection Time: 01/04/23  2:15 PM   Specimen: CSF; Cerebrospinal Fluid  Result Value Ref Range Status    Specimen Description CSF  Final   Special Requests NONE  Final   Gram Stain NO WBC SEEN NO ORGANISMS SEEN CYTOSPIN SMEAR   Final   Culture   Final    NO GROWTH 2 DAYS Performed at [facility], [address], [place], [place] [address]    Report Status PENDING  Incomplete  Culture, Fungus without Smear     Status: None (Preliminary result)   Collection Time: 01/04/23  2:15 PM   Specimen: PATH Cytology CSF; Cerebrospinal Fluid  Result Value Ref Range Status   Specimen Description CSF  Final   Special Requests NONE  Final   Culture   Final    NO FUNGUS ISOLATED AFTER 2 DAYS Performed at [facility], [address], [place], [place] [address]    Report Status PENDING  Incomplete  MRSA Next Gen by PCR, Nasal     Status: None   Collection Time: 01/04/23  5:48 PM   Specimen: Nasal Mucosa; Nasal Swab  Result Value Ref Range Status   MRSA by PCR Next Gen NOT DETECTED NOT DETECTED Final    Comment: (NOTE) The GeneXpert MRSA Assay (FDA approved for NASAL specimens only), is one component of a comprehensive MRSA colonization surveillance program. It is not intended to diagnose MRSA infection nor to guide or monitor treatment for MRSA infections. Test performance is not FDA approved in patients less than 83 years old. Performed at [facility], [address], [place], [place] [address]          Radiology Studies: DG Knee Complete 4 Views Left  Result Date: 01/06/2023 CLINICAL DATA:  Left thigh pain without known injury. EXAM: LEFT KNEE - COMPLETE 4+ VIEW COMPARISON:  None Available. FINDINGS: Status post left medial hemiarthroplasty. No fracture or dislocation is noted. Vascular calcifications are noted. IMPRESSION: No acute abnormality seen. Electronically Signed   By: Lupita Raider M.D.   On: 01/06/2023 14:28   DG FEMUR MIN 2 VIEWS LEFT  Result Date: 01/06/2023 CLINICAL DATA:  Left leg pain without known injury. EXAM: LEFT FEMUR 2 VIEWS COMPARISON:  None  Available. FINDINGS: There is no evidence of fracture or other focal bone lesions. Soft tissues are unremarkable. Status post left medial hemiarthroplasty. IMPRESSION: No acute abnormality seen. Electronically Signed   By: Lupita Raider M.D.   On: 01/06/2023 14:26   DG HIP UNILAT WITH PELVIS MIN 4 VIEWS LEFT  Result Date: 01/06/2023 CLINICAL DATA:  Left hip pain without known injury. EXAM: DG HIP (WITH OR WITHOUT PELVIS) 4+V LEFT COMPARISON:  None Available. FINDINGS: There is no evidence of hip fracture or dislocation. Minimal osteophyte formation of left hip is noted. 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 No acute abnormality seen. Electronically Signed   By: Lupita Raider M.D.   On: 01/06/2023 14:25   VAS [place] LOWER EXTREMITY VENOUS (DVT)  Result Date: 01/05/2023  Lower Venous DVT Study Patient Name:  OXFORD FOGLER  Date of Exam:   01/05/2023 Medical Rec #: [id number]        Accession #:    [id number] Date of Birth: [birth year]       Patient Gender: M Patient Age:   69 years Exam Location:  [facility] Procedure:      VAS [place] LOWER EXTREMITY VENOUS (DVT) Referring Phys: Karie Fetch --------------------------------------------------------------------------------  Indications: Edema, and pulmonary embolism.  Risk Factors: Confirmed PE. Comparison Study: No prior exam. Performing Technologist: Fernande Bras  Examination Guidelines: A complete evaluation includes B-mode imaging, spectral Doppler, color Doppler, and power Doppler as needed of all accessible portions of each vessel. Bilateral testing is considered an integral part of a complete examination. Limited examinations for reoccurring indications may be performed as noted. The reflux portion of the exam is performed with the patient in reverse Trendelenburg.  +---------+---------------+---------+-----------+----------+--------------+ RIGHT    CompressibilityPhasicitySpontaneityPropertiesThrombus Aging  +---------+---------------+---------+-----------+----------+--------------+ CFV      Full           Yes      Yes                                 +---------+---------------+---------+-----------+----------+--------------+ SFJ      Full                                                        +---------+---------------+---------+-----------+----------+--------------+ FV Prox  Full                                                        +---------+---------------+---------+-----------+----------+--------------+ FV Mid   Full                                                        +---------+---------------+---------+-----------+----------+--------------+ FV DistalFull                                                        +---------+---------------+---------+-----------+----------+--------------+ PFV      Full                                                        +---------+---------------+---------+-----------+----------+--------------+  POP      Full           Yes      Yes                                 +---------+---------------+---------+-----------+----------+--------------+ PTV      Full                                                        +---------+---------------+---------+-----------+----------+--------------+ PERO     Full                                                        +---------+---------------+---------+-----------+----------+--------------+   +---------+---------------+---------+-----------+----------+--------------+ LEFT     CompressibilityPhasicitySpontaneityPropertiesThrombus Aging +---------+---------------+---------+-----------+----------+--------------+ CFV      Full           Yes      Yes                                 +---------+---------------+---------+-----------+----------+--------------+ SFJ      Full                                                         +---------+---------------+---------+-----------+----------+--------------+ FV Prox  Full                                                        +---------+---------------+---------+-----------+----------+--------------+ FV Mid   Full                                                        +---------+---------------+---------+-----------+----------+--------------+ FV DistalFull                                                        +---------+---------------+---------+-----------+----------+--------------+ PFV      Full                                                        +---------+---------------+---------+-----------+----------+--------------+ POP      Full           Yes      Yes                                 +---------+---------------+---------+-----------+----------+--------------+  PTV      Full                                                        +---------+---------------+---------+-----------+----------+--------------+ PERO     Full                                                        +---------+---------------+---------+-----------+----------+--------------+ Patient stated he's experiencing pain on anterior mid portion on left thigh. Upon scanning an isolated lymph node was observed. No images documented.    Summary: BILATERAL: - No evidence of deep vein thrombosis seen in the lower extremities, bilaterally. -No evidence of popliteal cyst, bilaterally.   *See table(s) above for measurements and observations. Electronically signed by Heath Lark on 01/05/2023 at 6:03:20 PM.    Final         Scheduled Meds:  acetaminophen  1,000 mg Oral Q8H   amLODipine  5 mg Oral Daily   carbamazepine  200 mg Oral QID   Chlorhexidine Gluconate Cloth  6 each Topical Daily   gabapentin  600 mg Oral TID   insulin aspart  0-5 Units Subcutaneous QHS   insulin aspart  0-9 Units Subcutaneous TID WC   insulin aspart  3 Units Subcutaneous TID WC   insulin  glargine-yfgn  5 Units Subcutaneous Daily   metroNIDAZOLE  500 mg Oral Q12H   mouth rinse  15 mL Mouth Rinse 4 times per day   pantoprazole  40 mg Oral Daily   rosuvastatin  10 mg Oral Daily   sodium chloride flush  10-40 mL Intracatheter Q12H   warfarin  5 mg Oral ONCE-1600   Warfarin - Pharmacist Dosing Inpatient   Does not apply q1600   Continuous Infusions:  cefTRIAXone (ROCEPHIN)  IV Stopped (01/06/23 1158)   heparin 1,600 Units/hr (01/07/23 0310)     LOS: 3 days    Time spent: 58 minutes spent on chart review, discussion with nursing staff, consultants, updating family and interview/physical exam; more than 50% of that time was spent in counseling and/or coordination of care.    Alvira Philips [place], DO [facility] Available via Epic secure chat 7am-7pm After these hours, please refer to coverage provider listed on [email or web address] 01/07/2023, 9:49 AM

## 2023-01-07 NOTE — Progress Notes (Addendum)
PHARMACY - ANTICOAGULATION CONSULT NOTE  Pharmacy Consult for Heparin + Warfarin Indication:  rule out pulmonary embolus  No Known Allergies  Patient Measurements: Height: 5\' 6"  (167.6 cm) Weight: 85.7 kg (188 lb 15 oz) IBW/kg (Calculated) : 63.8  Vital Signs: Temp: 98.6 F (37 C) (11/09 0743) Temp Source: Oral (11/09 0743) BP: 143/69 (11/09 0743) Pulse Rate: 94 (11/09 0743)  Labs: Recent Labs    01/04/23 1352 01/04/23 2228 01/05/23 0355 01/05/23 1141 01/06/23 0410 01/06/23 1836 01/07/23 0503  HGB  --    < > 9.8*  --  9.1*  --  8.6*  HCT  --   --  31.0*  --  28.7*  --  26.4*  PLT  --   --  207  --  188  --  193  HEPARINUNFRC 0.27*   < >  --    < > 0.20* 0.19* 0.30  CREATININE  --   --  1.69*  --  1.47*  --  1.02  TROPONINIHS 3,448*  --  2,150*  --   --   --   --    < > = values in this interval not displayed.    Estimated Creatinine Clearance: 60.3 mL/min (by C-G formula based on SCr of 1.02 mg/dL).   Assessment:  82 y/o M with altered mental status, fever, tachypnea. CT anigo with possible PE. Heparin for now. Above labs reviewed. PTA meds reviewed. Pharmacy now consulted to initiate warfarin. Will continue heparin bridge per MD.   Significant DDI with warfarin: carbamazepine (decreases INR), metronidazole (increases INR)  11/9: HL 0.30, therapeutic on 1600 units/hr. No issues with infusion running or signs of bleeding noted per RN. Hgb down trending to 8.6, PLT stable at 193. Baseline INR 1.0 on 11/6.   Goal of Therapy:  INR: 2-3 Heparin level 0.3-0.7 units/ml Monitor platelets by anticoagulation protocol: Yes   Plan:  Continue heparin drip at 1600 units/hr Warfarin 5 mg x1 Monitor HL, INR, and CBC daily Monitor for s/sx of bleeding  Enos Fling, PharmD PGY-1 Acute Care Pharmacy Resident 01/07/2023 9:19 AM

## 2023-01-07 NOTE — Plan of Care (Signed)

## 2023-01-08 DIAGNOSIS — I2609 Other pulmonary embolism with acute cor pulmonale: Secondary | ICD-10-CM | POA: Diagnosis not present

## 2023-01-08 DIAGNOSIS — A419 Sepsis, unspecified organism: Secondary | ICD-10-CM | POA: Diagnosis not present

## 2023-01-08 DIAGNOSIS — K529 Noninfective gastroenteritis and colitis, unspecified: Secondary | ICD-10-CM | POA: Diagnosis not present

## 2023-01-08 DIAGNOSIS — R6521 Severe sepsis with septic shock: Secondary | ICD-10-CM | POA: Diagnosis not present

## 2023-01-08 LAB — CSF CULTURE W GRAM STAIN
Culture: NO GROWTH
Gram Stain: NONE SEEN

## 2023-01-08 LAB — BASIC METABOLIC PANEL
Anion gap: 11 (ref 5–15)
BUN: 10 mg/dL (ref 8–23)
CO2: 17 mmol/L — ABNORMAL LOW (ref 22–32)
Calcium: 8.3 mg/dL — ABNORMAL LOW (ref 8.9–10.3)
Chloride: 110 mmol/L (ref 98–111)
Creatinine, Ser: 0.87 mg/dL (ref 0.61–1.24)
GFR, Estimated: >60 mL/min (ref >60)
Glucose, Bld: 133 mg/dL — ABNORMAL HIGH (ref 70–99)
Potassium: 3.6 mmol/L (ref 3.5–5.1)
Sodium: 138 mmol/L (ref 135–145)

## 2023-01-08 LAB — CBC
HCT: 30.4 % — ABNORMAL LOW (ref 39.0–52.0)
Hemoglobin: 10.1 g/dL — ABNORMAL LOW (ref 13.0–17.0)
MCH: 31.2 pg (ref 26.0–34.0)
MCHC: 33.2 g/dL (ref 30.0–36.0)
MCV: 93.8 fL (ref 80.0–100.0)
Platelets: 209 10*3/uL (ref 150–400)
RBC: 3.24 MIL/uL — ABNORMAL LOW (ref 4.22–5.81)
RDW: 12.2 % (ref 11.5–15.5)
WBC: 7.4 10*3/uL (ref 4.0–10.5)
nRBC: 0 % (ref 0.0–0.2)

## 2023-01-08 LAB — PROTIME-INR
INR: 1.6 — ABNORMAL HIGH (ref 0.8–1.2)
Prothrombin Time: 18.9 s — ABNORMAL HIGH (ref 11.4–15.2)

## 2023-01-08 LAB — GLUCOSE, CAPILLARY
Glucose-Capillary: 128 mg/dL — ABNORMAL HIGH (ref 70–99)
Glucose-Capillary: 137 mg/dL — ABNORMAL HIGH (ref 70–99)
Glucose-Capillary: 129 mg/dL — ABNORMAL HIGH (ref 70–99)
Glucose-Capillary: 102 mg/dL — ABNORMAL HIGH (ref 70–99)

## 2023-01-08 LAB — HEPARIN LEVEL (UNFRACTIONATED): Heparin Unfractionated: 0.41 [IU]/mL (ref 0.30–0.70)

## 2023-01-08 LAB — MAGNESIUM: Magnesium: 1.8 mg/dL (ref 1.7–2.4)

## 2023-01-08 MED ORDER — POTASSIUM CHLORIDE CRYS ER 20 MEQ PO TBCR
40.0000 meq | EXTENDED_RELEASE_TABLET | Freq: Once | ORAL | Status: AC
  Administered 2023-01-08: 40 meq via ORAL
  Filled 2023-01-08: qty 2

## 2023-01-08 MED ORDER — WARFARIN SODIUM 2.5 MG PO TABS
2.5000 mg | ORAL_TABLET | Freq: Once | ORAL | Status: AC
  Administered 2023-01-08: 2.5 mg via ORAL
  Filled 2023-01-08: qty 1

## 2023-01-08 NOTE — Progress Notes (Signed)
PROGRESS NOTE    Larry Lane  YQM:[id number] DOB: Jun 09, 1940 DOA: 01/04/2023 PCP: Jerl Mina, MD    Brief Narrative:   Larry Lane is a 82 y.o. male with past medical history significant for HTN, HLD, DM2, trigeminal neuralgia, GERD, osteoarthritis who presented to [facility] ED on 01/04/2023 with complaints of abdominal pain.  Spouse also noted patient seemed confused with rigors night prior to ED presentation.  Denied urinary symptoms, no nausea/vomiting/diarrhea.  Initially admitted to the hospitalist service but significant decompensation with low blood pressure, tachycardia, elevated fever prompted referral to pulmonary/critical care medicine and ultimately was admitted to the intensive care unit same day.  Initial workup in the ED with temperature 103.0 F, HR 118, BP 74/62, SpO2 95% on room air.  WBC 8.0, hemoglobin 11.7, platelet count 271.  Sodium 132, potassium 3.9, chloride 103, CO2 19, glucose 182, BUN 21, creatinine 1.34.  AST 40, ALT 29, total bilirubin 0.7.  High sensitivity troponin 47>77.  INR 1.0.  Urinalysis unrevealing.  Chest x-ray with no acute cardiopulmonary disease process.  CT head without contrast with no acute intracranial [name], noted right suboccipital craniotomy with focal encephalomalacia within right cerebellar hemisphere.  CT angiogram chest with questionable left upper lobe and left lower lobe pulmonary emboli, RV mildly distended consistent with right heart strain, coronary artery calcifications, aortic atherosclerosis.  CT abdomen/pelvis segmental thickening walls of the terminal ileum possible infectious versus inflammatory ileitis with dilation of the small bowel proximal to the segment, small hiatal hernia.  Blood cultures x 2 were obtained.  Patient was started on empiric antibiotics.  Initially admitted to the hospital service but given decompensation was transferred to the ICU same day due to septic shock with requirement of vasopressors.  Significant  Hospital events: 11/6: Initially admitted to hospitalist service, transferred to ICU for septic shock requiring vasopressors, underwent lumbar puncture with CSF analysis unrevealing. 11/7: Continues to require vasopressors, intermittent fevers 11/8: Vasopressors titrated off; left knee x-ray unrevealing 11/9: Transferred back to hospitalist service   Assessment & Plan:   Septic shock 2/2 ileitis Patient presenting to ED with abdominal pain.  Noted to be febrile, tachycardic, hypotensive on admission.  Initially requiring vasopressor support and admitted to the intensive care unit.  Patient was started on empiric antibiotics due to suspected ileitis.  Underwent a lumbar puncture due to confusion/encephalopathy which was unrevealing.  Chest x-ray/CT angiogram chest negative for focal consolidation.  Left knee x-ray with no acute finding.  Urinalysis unrevealing.  MRSA PCR negative. -- Blood cultures x 2: No growth x 4 days -- Culture CSF: No growth x 3 days -- Ceftriaxone 2 g IV every 24 hours -- Metronidazole 500 mg PO every 12 hours -- Supportive care  Elevated troponin secondary to type II demand ischemia High sensitive troponin elevated at 47>77> 3448> 2150.  Etiology likely secondary to type II demand ischemia in the setting of septic shock as above from underlying ileitis versus submassive pulmonary embolism.  Acute renal failure: resolved Patient presenting with a creatinine of 1.34, peaked at 1.69.  Etiology likely secondary to prerenal azotemia in the setting of dehydration and poor oral intake in the days preceding hospitalization versus ATN from septic shock/hypotension.  Supported with IV fluid hydration, vasopressors. -- Cr 1.34>>1.69>1.47>1.02>0.87 -- Holding home losartan -- Continue encourage increased oral intake -- Avoid nephrotoxins, renal dose all medications -- BMP daily  Submassive pulmonary embolism CT angiogram chest with left upper lobe/left lower lobe pulmonary  emboli, RV mildly distended consistent with  right heart strain.  Troponin elevated.  TTE with LVEF 70 either 75%, LV with no regional wall motion abnormalities, RV systolic function mildly reduced, hypokinetic segments with hyperdynamic apex consistent with McConnell sign, RV size moderately enlarged, mild TR, IVC normal in size. -- Continue heparin drip, pharmacy consulted for dosing/monitoring -- Unable to transition to NOAC/DOAC/enoxaparin due to interactions with carbamazepine -- Continue warfarin, INR 1.6 today (goal 2-3); will need to maintain heparin drip until INR therapeutic -- INR daily  Acute metabolic encephalopathy: Resolved Patient presenting to ED with confusion, notably secondary to septic shock with hypoperfusion as above.  CT head, CSF analysis unrevealing.  Mental status now back to his typical baseline. -- Supportive care  Type 2 diabetes mellitus with hyperglycemia Hemoglobin A1c 7.6, not optimally controlled.  Home regimen includes Humalog 20 units once daily -- Semglee 5 units [place] daily -- NovoLog 3 units TIDAC -- SSI for coverage -- CBGs qAC/HS  Essential hypertension Home regimen includes amlodipine 5 mg p.o. daily, losartan 50 mg p.o. daily. -- Amlodipine 5 mg p.o. daily -- Continue to hold home losartan for now  Hyperlipidemia -- Crestor 10 mg p.o. daily  Trigeminal neuralgia -- Carbamazepine 200 mg p.o. 3 times daily -- Gabapentin 600 mg p.o. 3 times daily  GERD -- Protonix 40 mg p.o. daily (substituted for home omeprazole)  Weakness/debility/deconditioning/gait disturbance Seen by PT with recommendation of CIR admission on discharge -- rehab MD consult   DVT prophylaxis: SCDs Start: 01/04/23 1657 warfarin (COUMADIN) tablet 2.5 mg    Code Status: Full Code Family Communication:   Disposition Plan:  Level of care: Telemetry Medical Status is: Inpatient Remains inpatient appropriate because: Heparin drip, warfarin subtherapeutic; pending  CIR  Consultants:  PCCM - signed off 11/9  Procedures:  Lumbar puncture  Antimicrobials:  Vancomycin 11/6 - 11/7 Ceftriaxone 11/5>> Metronidazole 11/7>> Ampicillin 01/03/10/7 Acyclovir 01/03/10/6   Subjective: Patient seen examined bedside, resting calmly.  Lying in bed.  Spouse present.  Continues with poor appetite.  Remains on IV antibiotics.  INR subtherapeutic, just darted warfarin yesterday.  Difficulty obtaining labs last night, but lab tech able to obtain this morning.  No other specific questions or concerns at this time.  Denies headache, no dizziness, no chest pain, no palpitations, no shortness of breath, no current abdominal pain, no current fever, no chills/night sweats, no nausea/vomiting/diarrhea, no focal weakness, no cough/congestion, no paresthesias.  No acute events overnight per nursing staff.  Objective: Vitals:   01/08/23 0125 01/08/23 0437 01/08/23 0525 01/08/23 0752  BP: (!) 157/77 (!) 150/78  (!) 182/82  Pulse: 78 72  79  Resp: 14 18  18   Temp:  97.7 F (36.5 C)  98.3 F (36.8 C)  TempSrc:  Oral  Oral  SpO2: 98% 98%  100%  Weight:   84.9 kg   Height:        Intake/Output Summary (Last 24 hours) at 01/08/2023 0914 Last data filed at 01/07/2023 1505 Gross per 24 hour  Intake 297.39 ml  Output --  Net 297.39 ml   Filed Weights   01/06/23 0500 01/07/23 0623 01/08/23 0525  Weight: 86.4 kg 85.7 kg 84.9 kg    Examination:  Physical Exam: GEN: NAD, alert and oriented x 3, elderly, ill in appearance HEENT: NCAT, PERRL, EOMI, sclera clear, MMM PULM: CTAB w/o wheezes/crackles, normal respiratory effort, on room air CV: RRR w/o M/G/R GI: abd soft, NTND, NABS, no R/G/M MSK: no peripheral edema, muscle strength globally intact 5/5 bilateral upper/lower extremities  NEURO: CN II-XII intact, no focal deficits, sensation to light touch intact PSYCH: normal mood/affect Integumentary: No concerning rashes/lesions/wounds noted on exposed skin  surfaces    Data Reviewed: I have personally reviewed following labs and imaging studies  CBC: Recent Labs  Lab 01/04/23 0049 01/05/23 0355 01/06/23 0410 01/07/23 0503 01/08/23 0819  WBC 8.0 19.8* 10.2 7.2 7.4  NEUTROABS 7.6  --   --   --   --   HGB 11.7* 9.8* 9.1* 8.6* 10.1*  HCT 35.1* 31.0* 28.7* 26.4* 30.4*  MCV 93.6 98.1 98.6 94.0 93.8  PLT 271 207 188 193 209   Basic Metabolic Panel: Recent Labs  Lab 01/04/23 0049 01/05/23 0355 01/06/23 0410 01/07/23 0503 01/08/23 0819  NA 132* 133* 135 138 138  K 3.9 3.6 4.2 3.6 3.6  CL 103 110 115* 112* 110  CO2 19* 15* 17* 18* 17*  GLUCOSE 182* 220* 212* 180* 133*  BUN 21 26* 23 14 10   CREATININE 1.34* 1.69* 1.47* 1.02 0.87  CALCIUM 8.0* 6.7* 7.3* 8.3* 8.3*  MG  --  1.5* 2.4 2.1 1.8  PHOS  --  3.8  --   --   --    GFR: Estimated Creatinine Clearance: 70.3 mL/min (by C-G formula based on SCr of 0.87 mg/dL). Liver Function Tests: Recent Labs  Lab 01/04/23 0049  AST 40  ALT 29  ALKPHOS 65  BILITOT 0.7  PROT 5.5*  ALBUMIN 3.1*   No results for input(s): "LIPASE", "AMYLASE" in the last 168 hours. No results for input(s): "AMMONIA" in the last 168 hours. Coagulation Profile: Recent Labs  Lab 01/04/23 0101 01/08/23 0819  INR 1.0 1.6*   Cardiac Enzymes: No results for input(s): "CKTOTAL", "CKMB", "CKMBINDEX", "TROPONINI" in the last 168 hours. BNP (last 3 results) No results for input(s): "PROBNP" in the last 8760 hours. HbA1C: No results for input(s): "HGBA1C" in the last 72 hours. CBG: Recent Labs  Lab 01/07/23 0806 01/07/23 1200 01/07/23 1607 01/07/23 2012 01/08/23 0746  GLUCAP 208* 208* 207* 149* 128*   Lipid Profile: No results for input(s): "CHOL", "HDL", "LDLCALC", "TRIG", "CHOLHDL", "LDLDIRECT" in the last 72 hours. Thyroid Function Tests: No results for input(s): "TSH", "T4TOTAL", "FREET4", "T3FREE", "THYROIDAB" in the last 72 hours. Anemia Panel: No results for input(s): "VITAMINB12",  "FOLATE", "FERRITIN", "TIBC", "IRON", "RETICCTPCT" in the last 72 hours. Sepsis Labs: Recent Labs  Lab 01/04/23 0307 01/04/23 1352 01/04/23 1834 01/04/23 2228  LATICACIDVEN 2.0* 1.7 1.9 1.3    Recent Results (from the past 240 hour(s))  Blood Culture (routine x 2)     Status: None (Preliminary result)   Collection Time: 01/04/23  1:01 AM   Specimen: BLOOD  Result Value Ref Range Status   Specimen Description BLOOD SITE NOT SPECIFIED  Final   Special Requests   Final    BOTTLES DRAWN AEROBIC AND ANAEROBIC Blood Culture results may not be optimal due to an excessive volume of blood received in culture bottles   Culture   Final    NO GROWTH 4 DAYS Performed at [facility], [address] [place], [place] [address]    Report Status PENDING  Incomplete  Blood Culture (routine x 2)     Status: None (Preliminary result)   Collection Time: 01/04/23  2:17 AM   Specimen: BLOOD  Result Value Ref Range Status   Specimen Description BLOOD BLOOD LEFT ARM  Final   Special Requests   Final    BOTTLES DRAWN AEROBIC AND ANAEROBIC Blood  Culture adequate volume   Culture   Final    NO GROWTH 4 DAYS Performed at [facility], [address], [place], [place] [address]    Report Status PENDING  Incomplete  SARS Coronavirus 2 by RT PCR (hospital order, performed in [facility] hospital lab) *cepheid single result test* Anterior Nasal Swab     Status: None   Collection Time: 01/04/23  3:23 AM   Specimen: Anterior Nasal Swab  Result Value Ref Range Status   SARS Coronavirus 2 by RT PCR NEGATIVE NEGATIVE Final    Comment: Performed at [facility], [address], [place], [place] [address]  CSF culture w Gram Stain     Status: None (Preliminary result)   Collection Time: 01/04/23  2:15 PM   Specimen: CSF; Cerebrospinal Fluid  Result Value Ref Range Status   Specimen Description CSF  Final   Special Requests NONE  Final   Gram Stain NO WBC SEEN NO ORGANISMS SEEN CYTOSPIN  SMEAR   Final   Culture   Final    NO GROWTH 3 DAYS Performed at [facility], [address] [place], [place] [address]    Report Status PENDING  Incomplete  Culture, Fungus without Smear     Status: None (Preliminary result)   Collection Time: 01/04/23  2:15 PM   Specimen: PATH Cytology CSF; Cerebrospinal Fluid  Result Value Ref Range Status   Specimen Description CSF  Final   Special Requests NONE  Final   Culture   Final    NO GROWTH 4 DAYS Performed at [facility], [address] [place], [place] [address]    Report Status PENDING  Incomplete  MRSA Next Gen by PCR, Nasal     Status: None   Collection Time: 01/04/23  5:48 PM   Specimen: Nasal Mucosa; Nasal Swab  Result Value Ref Range Status   MRSA by PCR Next Gen NOT DETECTED NOT DETECTED Final    Comment: (NOTE) The GeneXpert MRSA Assay (FDA approved for NASAL specimens only), is one component of a comprehensive MRSA colonization surveillance program. It is not intended to diagnose MRSA infection nor to guide or monitor treatment for MRSA infections. Test performance is not FDA approved in patients less than 28 years old. Performed at [facility], [address], [place], [place] [address]          Radiology Studies: DG Knee Complete 4 Views Left  Result Date: 01/06/2023 CLINICAL DATA:  Left thigh pain without known injury. EXAM: LEFT KNEE - COMPLETE 4+ VIEW COMPARISON:  None Available. FINDINGS: Status post left medial hemiarthroplasty. No fracture or dislocation is noted. Vascular calcifications are noted. IMPRESSION: No acute abnormality seen. Electronically Signed   By: Lupita Raider M.D.   On: 01/06/2023 14:28   DG FEMUR MIN 2 VIEWS LEFT  Result Date: 01/06/2023 CLINICAL DATA:  Left leg pain without known injury. EXAM: LEFT FEMUR 2 VIEWS COMPARISON:  None Available. FINDINGS: There is no evidence of fracture or other focal bone lesions. Soft tissues are unremarkable. Status post left  medial hemiarthroplasty. IMPRESSION: No acute abnormality seen. Electronically Signed   By: Lupita Raider M.D.   On: 01/06/2023 14:26   DG HIP UNILAT WITH PELVIS MIN 4 VIEWS LEFT  Result Date: 01/06/2023 CLINICAL DATA:  Left hip pain without known injury. EXAM: DG HIP (WITH OR WITHOUT PELVIS) 4+V LEFT COMPARISON:  None Available. FINDINGS: There is no evidence of  hip fracture or dislocation. Minimal osteophyte formation of left hip is noted. 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 No acute abnormality seen. Electronically Signed   By: Lupita Raider M.D.   On: 01/06/2023 14:25        Scheduled Meds:  acetaminophen  1,000 mg Oral Q8H   amLODipine  5 mg Oral Daily   carbamazepine  200 mg Oral QID   Chlorhexidine Gluconate Cloth  6 each Topical Daily   gabapentin  600 mg Oral TID   insulin aspart  0-5 Units Subcutaneous QHS   insulin aspart  0-9 Units Subcutaneous TID WC   insulin aspart  3 Units Subcutaneous TID WC   insulin glargine-yfgn  5 Units Subcutaneous Daily   metroNIDAZOLE  500 mg Oral Q12H   mouth rinse  15 mL Mouth Rinse 4 times per day   pantoprazole  40 mg Oral Daily   rosuvastatin  10 mg Oral Daily   sodium chloride flush  10-40 mL Intracatheter Q12H   warfarin  2.5 mg Oral ONCE-1600   Warfarin - Pharmacist Dosing Inpatient   Does not apply q1600   Continuous Infusions:  cefTRIAXone (ROCEPHIN)  IV Stopped (01/07/23 1141)   heparin 1,600 Units/hr (01/07/23 1941)     LOS: 4 days    Time spent: 56 minutes spent on chart review, discussion with nursing staff, consultants, updating family and interview/physical exam; more than 50% of that time was spent in counseling and/or coordination of care.    Alvira Philips [place], DO [facility] Available via Epic secure chat 7am-7pm After these hours, please refer to coverage provider listed on [email or web address] 01/08/2023, 9:14 AM

## 2023-01-08 NOTE — Plan of Care (Signed)

## 2023-01-08 NOTE — Progress Notes (Addendum)
PHARMACY - ANTICOAGULATION CONSULT NOTE  Pharmacy Consult for Heparin + Warfarin Indication:  rule out pulmonary embolus  No Known Allergies  Patient Measurements: Height: 5\' 6"  (167.6 cm) Weight: 84.9 kg (187 lb 2.7 oz) IBW/kg (Calculated) : 63.8  Vital Signs: Temp: 98.3 F (36.8 C) (11/10 0752) Temp Source: Oral (11/10 0752) BP: 182/82 (11/10 0752) Pulse Rate: 79 (11/10 0752)  Labs: Recent Labs    01/06/23 0410 01/06/23 1836 01/07/23 0503 01/08/23 0819  HGB 9.1*  --  8.6* 10.1*  HCT 28.7*  --  26.4* 30.4*  PLT 188  --  193 209  LABPROT  --   --   --  18.9*  INR  --   --   --  1.6*  HEPARINUNFRC 0.20* 0.19* 0.30 0.41  CREATININE 1.47*  --  1.02  --     Estimated Creatinine Clearance: 60 mL/min (by C-G formula based on SCr of 1.02 mg/dL).   Assessment:  82 y/o M with altered mental status, fever, tachypnea. CT anigo with possible PE. Heparin for now. Above labs reviewed. PTA meds reviewed. Pharmacy now consulted to initiate warfarin. Will continue heparin bridge per MD.   Significant DDI with warfarin: carbamazepine (decreases INR), metronidazole (increases INR)  11/10: HL 0.41, therapeutic on 1600 units/hr. No issues with infusion running or signs of bleeding noted. INR increased to 1.6 after warfarin 5 mg x1. Noted patient missed one dose of carbamazepine on 11/9. Hgb increased to 10.1, Plt stable at 209.  Goal of Therapy:  INR: 2-3 Heparin level 0.3-0.7 units/ml Monitor platelets by anticoagulation protocol: Yes   Plan:  Continue heparin drip at 1600 units/hr Warfarin 2.5 mg x1, lower dose due to significant increase in INR Monitor HL, INR, and CBC daily Monitor for s/sx of bleeding  Enos Fling, PharmD PGY-1 Acute Care Pharmacy Resident 01/08/2023 9:02 AM

## 2023-01-08 NOTE — Plan of Care (Signed)
  Problem: Coping: Goal: Ability to adjust to condition or change in health will improve Outcome: Progressing   Problem: Fluid Volume: Goal: Ability to maintain a balanced intake and output will improve Outcome: Progressing   Problem: Skin Integrity: Goal: Risk for impaired skin integrity will decrease Outcome: Progressing   Problem: Education: Goal: Knowledge of General Education information will improve Description: Including pain rating scale, medication(s)/side effects and non-pharmacologic comfort measures Outcome: Progressing   Problem: Coping: Goal: Level of anxiety will decrease Outcome: Progressing   Problem: Safety: Goal: Ability to remain free from injury will improve Outcome: Progressing

## 2023-01-08 NOTE — Progress Notes (Signed)
Occupational Therapy Treatment Patient Details Name: Larry Lane MRN: [id number] DOB: 1940-07-01 Today's Date: 01/08/2023   History of present illness 82 y/o gentleman who presented 11/6 with episodes of confusion at home and rigors; Septic shock concern for meningitis, Acute metabolic encephalopathy, Troponin elevation concern for PE; submassive PE+.  With a history of trigeminal neuralgia, HTN   OT comments  OT session focused on training in techniques for increased safety and independence with ADLs, bed mobility during/in preparation for functional tasks, and functional transfers. Pt currently demonstrates ability to complete UB ADLs with Set up to Min assist, LB ADLs with Max assist, and Stand-pivot transfers with a RW with Min assist. Pt participated well and states he feels better today than yesterday. Pt is making progress toward OT goals. Pt will benefit from continued acute skilled OT services to address deficits, decrease caregiver burden, and increase safety and independence with functional tasks. Discharge plan updated this day based on pt progress and current needs. Post acute discharge, pt will benefit from intensive inpatient skilled rehab services > 3 hours per day to maximize rehab potential and facility safe discharge home with wife.       If plan is discharge home, recommend the following:  Two people to help with walking and/or transfers;A lot of help with bathing/dressing/bathroom;Assistance with cooking/housework;Assist for transportation;Help with stairs or ramp for entrance;Direct supervision/assist for medications management;Direct supervision/assist for financial management   Equipment Recommendations  Other (comment) (defer to next level of care)    Recommendations for Other Services Rehab consult    Precautions / Restrictions Precautions Precautions: Fall Restrictions Weight Bearing Restrictions: No       Mobility Bed Mobility Overal bed mobility: Needs  Assistance Bed Mobility: Supine to Sit, Sit to Supine     Supine to sit: Used rails, Min assist, HOB elevated Sit to supine: Min assist        Transfers Overall transfer level: Needs assistance Equipment used: Rolling walker (2 wheels) Transfers: Sit to/from Stand, Bed to chair/wheelchair/BSC Sit to Stand: Min assist, From elevated surface     Step pivot transfers: Min assist, From elevated surface (with use of RW)     General transfer comment: Pt fatiguing quickly in standing and during step-pivot transfer with Left knee buckling.     Balance Overall balance assessment: Needs assistance Sitting-balance support: No upper extremity supported, Feet supported Sitting balance-Leahy Scale: Fair     Standing balance support: Bilateral upper extremity supported, During functional activity, Reliant on assistive device for balance Standing balance-Leahy Scale: Poor Standing balance comment: Lt knee buckles                           ADL either performed or assessed with clinical judgement   ADL Overall ADL's : Needs assistance/impaired Eating/Feeding: Set up;Sitting   Grooming: Supervision/safety;Sitting   Upper Body Bathing: Minimal assistance;Sitting   Lower Body Bathing: Maximal assistance;Sit to/from stand   Upper Body Dressing : Minimal assistance;Sitting   Lower Body Dressing: Maximal assistance;Sit to/from stand   Toilet Transfer: Minimal assistance;Ambulation;BSC/3in1;Rolling walker (2 wheels);Cueing for safety;Cueing for sequencing (step-pivot transfer)   Toileting- Clothing Manipulation and Hygiene: Maximal assistance;Sit to/from stand         General ADL Comments: Pt presents with decreased activity tolerance during functional tasks.    Extremity/Trunk Assessment Upper Extremity Assessment Upper Extremity Assessment: Right hand dominant (overall B UE strength 4- to 4/5)   Lower Extremity Assessment Lower Extremity Assessment: Defer  to PT  evaluation        Vision       Perception     Praxis      Cognition Arousal: Alert Behavior During Therapy: WFL for tasks assessed/performed Overall Cognitive Status: Impaired/Different from baseline Area of Impairment: Memory, Following commands, Problem solving, Orientation, Safety/judgement                     Memory: Decreased short-term memory Following Commands: Follows one step commands consistently, Follows one step commands with increased time Safety/Judgement: Decreased awareness of safety, Decreased awareness of deficits   Problem Solving: Slow processing, Difficulty sequencing, Requires verbal cues General Comments: AAOx4 and pleasant throughout session.        Exercises      Shoulder Instructions       General Comments Pt's wife present throughout session. Other family present at beginning of session. OT educated pt and his wife in differences/similarities between inpatient rehab and short-term rehab in SNF with both verbalizing understanding.    Pertinent Vitals/ Pain       Pain Assessment Pain Assessment: Faces Faces Pain Scale: Hurts whole lot (8 in standing and with movement; 0 to 2 at rest in supine and siiting) Pain Location: Left anterior thigh, radiating down anterior shin; pain greatest at Left knee; worse in standing and with movement Pain Descriptors / Indicators: Sore, Radiating Pain Intervention(s): Limited activity within patient's tolerance, Monitored during session, Repositioned  Home Living                                          Prior Functioning/Environment              Frequency  Min 1X/week        Progress Toward Goals  OT Goals(current goals can now be found in the care plan section)  Progress towards OT goals: Progressing toward goals  Acute Rehab OT Goals OT Goal Formulation: With patient/family  Plan      Co-evaluation                 AM-PAC OT "6 Clicks" Daily Activity      Outcome Measure   Help from another person eating meals?: A Little Help from another person taking care of personal grooming?: A Little Help from another person toileting, which includes using toliet, bedpan, or urinal?: A Lot Help from another person bathing (including washing, rinsing, drying)?: A Lot Help from another person to put on and taking off regular upper body clothing?: A Little Help from another person to put on and taking off regular lower body clothing?: A Lot 6 Click Score: 15    End of Session Equipment Utilized During Treatment: Gait belt;Rolling walker (2 wheels)  OT Visit Diagnosis: Unsteadiness on feet (R26.81);Other abnormalities of gait and mobility (R26.89);Muscle weakness (generalized) (M62.81);Pain;Other symptoms and signs involving cognitive function;History of falling (Z91.81)   Activity Tolerance Patient tolerated treatment well   Patient Left in bed;with call bell/phone within reach;with family/visitor present   Nurse Communication Mobility status        Time: 1610-9604 OT Time Calculation (min): 40 min  Charges: OT General Charges $OT Visit: 1 Visit OT Treatments $Self Care/Home Management : 38-52 mins  Brandice Busser "Orson Eva., OTR/L, MA Acute Rehab [phone]   Lendon Colonel 01/08/2023, 5:01 PM

## 2023-01-09 ENCOUNTER — Other Ambulatory Visit: Payer: Self-pay

## 2023-01-09 ENCOUNTER — Inpatient Hospital Stay (HOSPITAL_COMMUNITY)
Admission: AD | Admit: 2023-01-09 | Discharge: 2023-01-17 | DRG: 945 | Disposition: A | Discharge status: Home / Self Care | Payer: Medicare Other | Source: Intra-hospital | Attending: Physical Medicine and Rehabilitation | Admitting: Physical Medicine and Rehabilitation

## 2023-01-09 ENCOUNTER — Encounter (HOSPITAL_COMMUNITY): Payer: Self-pay | Admitting: Physical Medicine and Rehabilitation

## 2023-01-09 DIAGNOSIS — Z85828 Personal history of other malignant neoplasm of skin: Secondary | ICD-10-CM | POA: Diagnosis not present

## 2023-01-09 DIAGNOSIS — Z79899 Other long term (current) drug therapy: Secondary | ICD-10-CM | POA: Diagnosis not present

## 2023-01-09 DIAGNOSIS — G500 Trigeminal neuralgia: Secondary | ICD-10-CM | POA: Diagnosis present

## 2023-01-09 DIAGNOSIS — M7989 Other specified soft tissue disorders: Secondary | ICD-10-CM | POA: Diagnosis present

## 2023-01-09 DIAGNOSIS — I10 Essential (primary) hypertension: Secondary | ICD-10-CM | POA: Diagnosis present

## 2023-01-09 DIAGNOSIS — D638 Anemia in other chronic diseases classified elsewhere: Secondary | ICD-10-CM | POA: Diagnosis present

## 2023-01-09 DIAGNOSIS — K219 Gastro-esophageal reflux disease without esophagitis: Secondary | ICD-10-CM | POA: Diagnosis present

## 2023-01-09 DIAGNOSIS — I2489 Other forms of acute ischemic heart disease: Secondary | ICD-10-CM | POA: Diagnosis present

## 2023-01-09 DIAGNOSIS — K529 Noninfective gastroenteritis and colitis, unspecified: Secondary | ICD-10-CM | POA: Diagnosis not present

## 2023-01-09 DIAGNOSIS — G5721 Lesion of femoral nerve, right lower limb: Secondary | ICD-10-CM | POA: Diagnosis present

## 2023-01-09 DIAGNOSIS — S7412XD Injury of femoral nerve at hip and thigh level, left leg, subsequent encounter: Secondary | ICD-10-CM | POA: Diagnosis not present

## 2023-01-09 DIAGNOSIS — Z01810 Encounter for preprocedural cardiovascular examination: Secondary | ICD-10-CM | POA: Diagnosis not present

## 2023-01-09 DIAGNOSIS — R6521 Severe sepsis with septic shock: Secondary | ICD-10-CM | POA: Diagnosis not present

## 2023-01-09 DIAGNOSIS — D62 Acute posthemorrhagic anemia: Secondary | ICD-10-CM | POA: Diagnosis not present

## 2023-01-09 DIAGNOSIS — R5381 Other malaise: Secondary | ICD-10-CM | POA: Diagnosis present

## 2023-01-09 DIAGNOSIS — Z7982 Long term (current) use of aspirin: Secondary | ICD-10-CM

## 2023-01-09 DIAGNOSIS — I50810 Right heart failure, unspecified: Secondary | ICD-10-CM | POA: Diagnosis not present

## 2023-01-09 DIAGNOSIS — Z961 Presence of intraocular lens: Secondary | ICD-10-CM | POA: Diagnosis present

## 2023-01-09 DIAGNOSIS — A419 Sepsis, unspecified organism: Principal | ICD-10-CM | POA: Diagnosis present

## 2023-01-09 DIAGNOSIS — R195 Other fecal abnormalities: Secondary | ICD-10-CM

## 2023-01-09 DIAGNOSIS — R008 Other abnormalities of heart beat: Secondary | ICD-10-CM | POA: Diagnosis not present

## 2023-01-09 DIAGNOSIS — I2510 Atherosclerotic heart disease of native coronary artery without angina pectoris: Secondary | ICD-10-CM | POA: Diagnosis present

## 2023-01-09 DIAGNOSIS — E119 Type 2 diabetes mellitus without complications: Secondary | ICD-10-CM | POA: Diagnosis present

## 2023-01-09 DIAGNOSIS — E785 Hyperlipidemia, unspecified: Secondary | ICD-10-CM | POA: Diagnosis present

## 2023-01-09 DIAGNOSIS — Z8249 Family history of ischemic heart disease and other diseases of the circulatory system: Secondary | ICD-10-CM | POA: Diagnosis not present

## 2023-01-09 DIAGNOSIS — N179 Acute kidney failure, unspecified: Secondary | ICD-10-CM | POA: Diagnosis not present

## 2023-01-09 DIAGNOSIS — R6520 Severe sepsis without septic shock: Secondary | ICD-10-CM | POA: Diagnosis not present

## 2023-01-09 DIAGNOSIS — I700 Atherosclerosis of aorta: Secondary | ICD-10-CM | POA: Diagnosis present

## 2023-01-09 DIAGNOSIS — I2699 Other pulmonary embolism without acute cor pulmonale: Secondary | ICD-10-CM | POA: Diagnosis not present

## 2023-01-09 DIAGNOSIS — Z794 Long term (current) use of insulin: Secondary | ICD-10-CM | POA: Diagnosis not present

## 2023-01-09 DIAGNOSIS — D649 Anemia, unspecified: Secondary | ICD-10-CM | POA: Diagnosis not present

## 2023-01-09 DIAGNOSIS — K922 Gastrointestinal hemorrhage, unspecified: Secondary | ICD-10-CM | POA: Diagnosis not present

## 2023-01-09 LAB — CBC
HCT: 28 % — ABNORMAL LOW (ref 39.0–52.0)
Hemoglobin: 9.5 g/dL — ABNORMAL LOW (ref 13.0–17.0)
MCH: 31.4 pg (ref 26.0–34.0)
MCHC: 33.9 g/dL (ref 30.0–36.0)
MCV: 92.4 fL (ref 80.0–100.0)
Platelets: 232 10*3/uL (ref 150–400)
RBC: 3.03 MIL/uL — ABNORMAL LOW (ref 4.22–5.81)
RDW: 12.1 % (ref 11.5–15.5)
WBC: 7.6 10*3/uL (ref 4.0–10.5)
nRBC: 0 % (ref 0.0–0.2)

## 2023-01-09 LAB — CULTURE, BLOOD (ROUTINE X 2)
Culture: NO GROWTH
Culture: NO GROWTH
Special Requests: ADEQUATE

## 2023-01-09 LAB — GLUCOSE, CAPILLARY
Glucose-Capillary: 106 mg/dL — ABNORMAL HIGH (ref 70–99)
Glucose-Capillary: 146 mg/dL — ABNORMAL HIGH (ref 70–99)
Glucose-Capillary: 182 mg/dL — ABNORMAL HIGH (ref 70–99)
Glucose-Capillary: 155 mg/dL — ABNORMAL HIGH (ref 70–99)

## 2023-01-09 LAB — PROTIME-INR
INR: 2.9 — ABNORMAL HIGH (ref 0.8–1.2)
Prothrombin Time: 30.5 s — ABNORMAL HIGH (ref 11.4–15.2)

## 2023-01-09 LAB — HEPARIN LEVEL (UNFRACTIONATED): Heparin Unfractionated: 0.44 [IU]/mL (ref 0.30–0.70)

## 2023-01-09 MED ORDER — OXYCODONE HCL 5 MG PO TABS
5.0000 mg | ORAL_TABLET | ORAL | Status: DC | PRN
  Administered 2023-01-12 – 2023-01-16 (×5): 5 mg via ORAL
  Filled 2023-01-09 (×5): qty 1

## 2023-01-09 MED ORDER — INSULIN GLARGINE-YFGN 100 UNIT/ML ~~LOC~~ SOLN: 5.0000 [IU] | Freq: Every day | SUBCUTANEOUS | Status: DC

## 2023-01-09 MED ORDER — METRONIDAZOLE 500 MG PO TABS: 500.0000 mg | ORAL_TABLET | Freq: Two times a day (BID) | ORAL | Status: DC

## 2023-01-09 MED ORDER — ONDANSETRON HCL 4 MG/2ML IJ SOLN: 4.0000 mg | Freq: Four times a day (QID) | INTRAMUSCULAR | Status: DC | PRN

## 2023-01-09 MED ORDER — GABAPENTIN 300 MG PO CAPS
600.0000 mg | ORAL_CAPSULE | Freq: Three times a day (TID) | ORAL | Status: DC
  Administered 2023-01-09 – 2023-01-17 (×23): 600 mg via ORAL
  Filled 2023-01-09 (×23): qty 2

## 2023-01-09 MED ORDER — INSULIN ASPART 100 UNIT/ML IJ SOLN
0.0000 [IU] | Freq: Every day | INTRAMUSCULAR | Status: DC
  Administered 2023-01-12 – 2023-01-15 (×2): 2 [IU] via SUBCUTANEOUS

## 2023-01-09 MED ORDER — POLYETHYLENE GLYCOL 3350 17 G PO PACK: 17.0000 g | PACK | Freq: Every day | ORAL | Status: DC | PRN

## 2023-01-09 MED ORDER — FLEET ENEMA RE ENEM: 1.0000 | ENEMA | Freq: Once | RECTAL | Status: DC | PRN

## 2023-01-09 MED ORDER — METHOCARBAMOL 500 MG PO TABS: 500.0000 mg | ORAL_TABLET | Freq: Four times a day (QID) | ORAL | Status: DC | PRN

## 2023-01-09 MED ORDER — INSULIN ASPART 100 UNIT/ML IJ SOLN
3.0000 [IU] | Freq: Three times a day (TID) | INTRAMUSCULAR | Status: DC
  Administered 2023-01-09 – 2023-01-17 (×15): 3 [IU] via SUBCUTANEOUS

## 2023-01-09 MED ORDER — GUAIFENESIN-DM 100-10 MG/5ML PO SYRP: 10.0000 mL | ORAL_SOLUTION | Freq: Four times a day (QID) | ORAL | Status: DC | PRN

## 2023-01-09 MED ORDER — INSULIN GLARGINE-YFGN 100 UNIT/ML ~~LOC~~ SOLN
5.0000 [IU] | Freq: Every day | SUBCUTANEOUS | Status: DC
  Administered 2023-01-10 – 2023-01-12 (×3): 5 [IU] via SUBCUTANEOUS
  Filled 2023-01-09 (×3): qty 0.05

## 2023-01-09 MED ORDER — AMLODIPINE BESYLATE 5 MG PO TABS
5.0000 mg | ORAL_TABLET | Freq: Every day | ORAL | Status: DC
  Administered 2023-01-10 – 2023-01-17 (×8): 5 mg via ORAL
  Filled 2023-01-09 (×8): qty 1

## 2023-01-09 MED ORDER — ONDANSETRON HCL 4 MG PO TABS
4.0000 mg | ORAL_TABLET | Freq: Four times a day (QID) | ORAL | Status: DC | PRN
Start: 2023-01-09 — End: 2023-01-17

## 2023-01-09 MED ORDER — BISACODYL 10 MG RE SUPP: 10.0000 mg | Freq: Every day | RECTAL | Status: DC | PRN

## 2023-01-09 MED ORDER — SODIUM CHLORIDE 0.9% FLUSH: 10.0000 mL | INTRAVENOUS | Status: DC | PRN

## 2023-01-09 MED ORDER — PANTOPRAZOLE SODIUM 40 MG PO TBEC
40.0000 mg | DELAYED_RELEASE_TABLET | Freq: Every day | ORAL | Status: DC
  Administered 2023-01-10 – 2023-01-17 (×8): 40 mg via ORAL
  Filled 2023-01-09 (×8): qty 1

## 2023-01-09 MED ORDER — CARBAMAZEPINE ER 200 MG PO TB12
200.0000 mg | ORAL_TABLET | Freq: Four times a day (QID) | ORAL | Status: DC
  Administered 2023-01-09 – 2023-01-17 (×30): 200 mg via ORAL
  Filled 2023-01-09 (×34): qty 1

## 2023-01-09 MED ORDER — INSULIN ASPART 100 UNIT/ML IJ SOLN
0.0000 [IU] | Freq: Three times a day (TID) | INTRAMUSCULAR | Status: DC
  Administered 2023-01-09 – 2023-01-10 (×3): 2 [IU] via SUBCUTANEOUS
  Administered 2023-01-10: 1 [IU] via SUBCUTANEOUS
  Administered 2023-01-11: 2 [IU] via SUBCUTANEOUS
  Administered 2023-01-11: 3 [IU] via SUBCUTANEOUS
  Administered 2023-01-11 – 2023-01-12 (×3): 2 [IU] via SUBCUTANEOUS
  Administered 2023-01-12 – 2023-01-13 (×2): 3 [IU] via SUBCUTANEOUS
  Administered 2023-01-13: 2 [IU] via SUBCUTANEOUS
  Administered 2023-01-14 (×2): 3 [IU] via SUBCUTANEOUS
  Administered 2023-01-14: 1 [IU] via SUBCUTANEOUS
  Administered 2023-01-15: 3 [IU] via SUBCUTANEOUS
  Administered 2023-01-15 (×2): 2 [IU] via SUBCUTANEOUS
  Administered 2023-01-16: 3 [IU] via SUBCUTANEOUS
  Administered 2023-01-16: 2 [IU] via SUBCUTANEOUS
  Administered 2023-01-16: 3 [IU] via SUBCUTANEOUS
  Administered 2023-01-17 (×2): 2 [IU] via SUBCUTANEOUS

## 2023-01-09 MED ORDER — ALUM & MAG HYDROXIDE-SIMETH 200-200-20 MG/5ML PO SUSP
30.0000 mL | ORAL | Status: DC | PRN
  Administered 2023-01-12 – 2023-01-16 (×4): 30 mL via ORAL
  Filled 2023-01-09 (×4): qty 30

## 2023-01-09 MED ORDER — DIPHENHYDRAMINE HCL 25 MG PO CAPS: 25.0000 mg | ORAL_CAPSULE | Freq: Four times a day (QID) | ORAL | Status: DC | PRN

## 2023-01-09 MED ORDER — WARFARIN - PHARMACIST DOSING INPATIENT: Freq: Every day | Status: DC

## 2023-01-09 MED ORDER — METRONIDAZOLE 500 MG PO TABS
500.0000 mg | ORAL_TABLET | Freq: Two times a day (BID) | ORAL | Status: AC
  Administered 2023-01-09: 500 mg via ORAL
  Filled 2023-01-09: qty 1

## 2023-01-09 MED ORDER — INSULIN ASPART 100 UNIT/ML IJ SOLN: 3.0000 [IU] | Freq: Three times a day (TID) | INTRAMUSCULAR | Status: DC

## 2023-01-09 MED ORDER — ACETAMINOPHEN 325 MG PO TABS
325.0000 mg | ORAL_TABLET | ORAL | Status: DC | PRN
  Administered 2023-01-16: 650 mg via ORAL
  Filled 2023-01-09: qty 2

## 2023-01-09 NOTE — H&P (Incomplete)
Physical Medicine and Rehabilitation Admission H&P   CC: Functional deficits secondary to sepsis, PE  HPI: Larry Lane is a 82 year old male brought in by EMS to [facility] emergency department on 01/04/2023.  According to the patient's wife, the patient had not been feeling well for approximately 1 week.  He had complaints of stomach cramping, bilateral leg cramping and to presentation, shaking chill with altered mental status.  This prompted her to call 911.  Patient was tachycardic with SaO2 of 89% on arrival.  Rate fever.  Chest x-ray was negative for pneumonia.  CT head negative for acute abnormality.  Troponins were mildly elevated.  CTA PE study was obtained and significant for possible PE with possible heart strain and no evidence of pneumonia.  Heparin infusion started.  He developed worsening fever to 103.  Urgent lumbar puncture performed by interventional radiology and empiric coverage with multiple antimicrobials and acyclovir were started.  He was admitted to the ICU and critical care medicine consulted.  Required vasopressor support.  Has a history of trigeminal neuralgia on gabapentin and Tegretol.  These were held initially.  The left acute kidney injury with creatinine 1.69 and BUN of 26.  High-sensitivity troponin 2150 on 11/07.  Norepinephrine discontinued 11/07.  TTE showed RV dysfunction consistent with McConnell sign, LV is hyperdynamic.  Transferred to medicine service with telemetry on 11-08.  Films of left knee performed unrevealing of infectious source.  CSF culture no growth at 2 days.  Continued on ceftriaxone 2 g IV every 24 hours and metronidazole 500 mg every 12 hours.  Serum creatinine has trended down and now normal.  Unable to transition to DOAC secondary to interactions with carbamazepine.  He was started on warfarin on 11/09 with a goal INR of 2-3.  Status now back to baseline.  He has a history of type 2 diabetes mellitus and on sliding scale insulin, meal coverage and  long-acting insulin.  His home amlodipine has been restarted and losartan held due to kidney function.  This time Crestor restarted.  Received last dose of metronidazole this evening, 11/11.  Was ambulated in the hallway with mobility specialist with contact-guard assist and rolling walker.  Remittent left knee buckling.The patient requires inpatient medicine and rehabilitation evaluations and services for ongoing dysfunction secondary to septic shock, PE, debility.  ROS Past Medical History:  Diagnosis Date   Abscess of back    Arthritis    Bacterial infection 2015   Cancer (HCC)    skin    Diabetes mellitus without complication (HCC)    Dysrhythmia    Edema    RIGHT ANKLE   GERD (gastroesophageal reflux disease)    Hypertension    Trigeminal nerve disorder    Trigeminal neuralgia    Past Surgical History:  Procedure Laterality Date   APPENDECTOMY  2010   CATARACT EXTRACTION W/PHACO Right 01/26/2017   Procedure: CATARACT EXTRACTION PHACO AND INTRAOCULAR LENS PLACEMENT (IOC);  Surgeon: Nevada Crane, MD;  Location: [facility] ORS;  Service: Ophthalmology;  Laterality: Right;  Lot # [id number] H [place]: 00:48.2 AP%: 9.9 CDE: 5.35   I&D of back abscess     TRIGEMINAL NERVE DECOMPRESSION     Family History  Problem Relation Age of Onset   Heart disease Father    Social History:  reports that he has never smoked. He has never used smokeless tobacco. He reports that he does not drink alcohol and does not use drugs. Allergies: No Known Allergies Medications Prior to Admission  Medication Sig Dispense Refill   acetaminophen (TYLENOL) 500 MG tablet Take 1,000 mg by mouth every 6 (six) hours as needed.     amLODipine (NORVASC) 5 MG tablet Take 1 tablet by mouth daily.     Apoaequorin 10 MG CAPS Take 1 capsule by mouth daily. Prevagen     aspirin EC 81 MG tablet Take 325 mg by mouth daily.     carbamazepine (CARBATROL) 200 MG 12 hr capsule Take 200 mg by mouth 4 (four) times daily.      cholestyramine light (PREVALITE) 4 GM/DOSE powder Take 4 g by mouth 2 (two) times daily with a meal.     gabapentin (NEURONTIN) 600 MG tablet Take 600 mg by mouth 3 (three) times daily.     glucagon (GLUCAGON EMERGENCY) 1 MG injection Inject 1 mg into the skin.     insulin lispro (HUMALOG) 100 UNIT/ML injection Inject 20 Units into the skin once. Inject 20 units once per day; can bolus up to 56 units once a day as directed via VGO Pump     losartan (COZAAR) 50 MG tablet Take 50 mg daily by mouth.     Multiple Vitamin (MULTI-VITAMIN) tablet Take 1 tablet by mouth daily.     omeprazole (PRILOSEC) 20 MG capsule Take 20 mg by mouth daily.     rosuvastatin (CRESTOR) 10 MG tablet Take 1 tablet by mouth daily.        Home: Home Living Family/patient expects to be discharged to:: Private residence Living Arrangements: Spouse/significant other Available Help at Discharge: Family, Available 24 hours/day Type of Home: House Home Access: Stairs to enter [employer] of Steps: 6 Entrance Stairs-Rails: Right, Left Home Layout: One level, Other (Comment) (1 step down to living room) Bathroom Shower/Tub: Health visitor: Standard Bathroom Accessibility: Yes Home Equipment: Agricultural consultant (2 wheels) (wife may have given away)   Functional History: Prior Function Prior Level of Function : Independent/Modified Independent, Driving, History of Falls (last six months) Mobility Comments: ind, retired Visual merchandiser ADLs Comments: ind  Functional Status:  Mobility: Bed Mobility Overal bed mobility: Needs Assistance Bed Mobility: Supine to Sit Supine to sit: Used rails, Min assist, HOB elevated Sit to supine: Min assist General bed mobility comments: Min A for LLE elevation, but pt able to advance to EOB. Cues for technique and use of rail and pt able to elevate trunk with increased time/effort, but no physical assist Transfers Overall transfer level: Needs assistance Equipment  used: Rolling walker (2 wheels) Transfers: Sit to/from Stand Sit to Stand: Contact guard assist Bed to/from chair/wheelchair/BSC transfer type:: Step pivot Step pivot transfers: Min assist, From elevated surface (with use of RW) General transfer comment: cues for safe hand placement and increased time to reach full upright posture Ambulation/Gait Ambulation/Gait assistance: Contact guard assist, Min assist Gait Distance (Feet): 20 Feet Assistive device: Rolling walker (2 wheels) Gait Pattern/deviations: Step-through pattern, Decreased stride length, Trunk flexed General Gait Details: Pt demonstrating slow steps with cues for sequencing and RW management. L knee buckling in stance requiring intermittent min A for blocking, but pt able to support with BUE to prevent LOB. Cues for pacing at end of gait trial due to pt walking quicker with increased instability Gait velocity: dec Gait velocity interpretation: <1.31 ft/sec, indicative of household ambulator Pre-gait activities: weight shifting    ADL: ADL Overall ADL's : Needs assistance/impaired Eating/Feeding: Set up, Sitting Grooming: Supervision/safety, Sitting Upper Body Bathing: Minimal assistance, Sitting Lower Body Bathing: Maximal assistance, Sit to/from stand  Upper Body Dressing : Minimal assistance, Sitting Lower Body Dressing: Maximal assistance, Sit to/from stand Toilet Transfer: Minimal assistance, Ambulation, BSC/3in1, Rolling walker (2 wheels), Cueing for safety, Cueing for sequencing (step-pivot transfer) Toileting- Clothing Manipulation and Hygiene: Maximal assistance, Sit to/from stand Functional mobility during ADLs: Minimal assistance, Rolling walker (2 wheels) General ADL Comments: Pt presents with decreased activity tolerance during functional tasks.  Cognition: Cognition Overall Cognitive Status: Impaired/Different from baseline Arousal/Alertness: Awake/alert Orientation Level: Oriented X4 Attention: Focused,  Selective Focused Attention: Impaired Focused Attention Impairment: Verbal complex, Functional complex Selective Attention: Impaired Selective Attention Impairment: Verbal complex, Functional complex Memory: Impaired Memory Impairment: Decreased short term memory, Retrieval deficit Decreased Short Term Memory: Verbal basic, Functional basic Awareness: Appears intact Cognition Arousal: Alert Behavior During Therapy: WFL for tasks assessed/performed Overall Cognitive Status: Impaired/Different from baseline Area of Impairment: Memory, Following commands, Problem solving, Orientation, Safety/judgement Orientation Level: Disoriented to, Place, Situation Memory: Decreased short-term memory Following Commands: Follows one step commands consistently, Follows one step commands with increased time Safety/Judgement: Decreased awareness of safety, Decreased awareness of deficits Problem Solving: Slow processing, Difficulty sequencing, Requires verbal cues General Comments: Increased cues during session and wife reporting that pt is [facility]  Physical Exam: Blood pressure (!) 171/93, pulse 87, temperature 98.2 F (36.8 C), temperature source Oral, resp. rate 16, height 5\' 6"  (1.676 m), weight 84.9 kg, SpO2 97%. Physical Exam  Results for orders placed or performed during the hospital encounter of 01/04/23 (from the past 48 hour(s))  Glucose, capillary     Status: Abnormal   Collection Time: 01/07/23  4:07 PM  Result Value Ref Range   Glucose-Capillary 207 (H) 70 - 99 mg/dL    Comment: Glucose reference range applies only to samples taken after fasting for at least 8 hours.  Glucose, capillary     Status: Abnormal   Collection Time: 01/07/23  8:12 PM  Result Value Ref Range   Glucose-Capillary 149 (H) 70 - 99 mg/dL    Comment: Glucose reference range applies only to samples taken after fasting for at least 8 hours.  Glucose, capillary     Status: Abnormal   Collection Time: 01/08/23  7:46 AM   Result Value Ref Range   Glucose-Capillary 128 (H) 70 - 99 mg/dL    Comment: Glucose reference range applies only to samples taken after fasting for at least 8 hours.  CBC     Status: Abnormal   Collection Time: 01/08/23  8:19 AM  Result Value Ref Range   WBC 7.4 4.0 - 10.5 K/uL   RBC 3.24 (L) 4.22 - 5.81 MIL/uL   Hemoglobin 10.1 (L) 13.0 - 17.0 g/dL   HCT 16.1 (L) 09.6 - 04.5 %   MCV 93.8 80.0 - 100.0 fL   MCH 31.2 26.0 - 34.0 pg   MCHC 33.2 30.0 - 36.0 g/dL   RDW 40.9 81.1 - 91.4 %   Platelets 209 150 - 400 K/uL   nRBC 0.0 0.0 - 0.2 %    Comment: Performed at [facility], [address] [place], [place] [address]  Heparin level (unfractionated)     Status: None   Collection Time: 01/08/23  8:19 AM  Result Value Ref Range   Heparin Unfractionated 0.41 0.30 - 0.70 IU/mL    Comment: (NOTE) The clinical reportable range upper limit is being lowered to >1.10 to align with the FDA approved guidance for the current laboratory assay.  If heparin results are below expected values, and patient dosage has  been confirmed, suggest follow up testing of antithrombin III levels. Performed at [facility], [address], [place], [place] [address]   Protime-INR     Status: Abnormal   Collection Time: 01/08/23  8:19 AM  Result Value Ref Range   Prothrombin Time 18.9 (H) 11.4 - 15.2 seconds   INR 1.6 (H) 0.8 - 1.2    Comment: (NOTE) INR goal varies based on device and disease states. Performed at [facility], [address], [place], [place] [address]   Basic metabolic panel     Status: Abnormal   Collection Time: 01/08/23  8:19 AM  Result Value Ref Range   Sodium 138 135 - 145 mmol/L   Potassium 3.6 3.5 - 5.1 mmol/L   Chloride 110 98 - 111 mmol/L   CO2 17 (L) 22 - 32 mmol/L   Glucose, Bld 133 (H) 70 - 99 mg/dL    Comment: Glucose reference range applies only to samples taken after fasting for at least 8 hours.   BUN 10 8 - 23 mg/dL   Creatinine, Ser 5.28  0.61 - 1.24 mg/dL   Calcium 8.3 (L) 8.9 - 10.3 mg/dL   GFR, Estimated >41 >32 mL/min    Comment: (NOTE) Calculated using the CKD-EPI Creatinine Equation (2021)    Anion gap 11 5 - 15    Comment: Performed at [facility], [address] [place], [place] [address]  Magnesium     Status: None   Collection Time: 01/08/23  8:19 AM  Result Value Ref Range   Magnesium 1.8 1.7 - 2.4 mg/dL    Comment: Performed at [facility], [address] [place], [place] [address]  Glucose, capillary     Status: Abnormal   Collection Time: 01/08/23 11:43 AM  Result Value Ref Range   Glucose-Capillary 137 (H) 70 - 99 mg/dL    Comment: Glucose reference range applies only to samples taken after fasting for at least 8 hours.  Glucose, capillary     Status: Abnormal   Collection Time: 01/08/23  5:11 PM  Result Value Ref Range   Glucose-Capillary 129 (H) 70 - 99 mg/dL    Comment: Glucose reference range applies only to samples taken after fasting for at least 8 hours.  Glucose, capillary     Status: Abnormal   Collection Time: 01/08/23  9:37 PM  Result Value Ref Range   Glucose-Capillary 102 (H) 70 - 99 mg/dL    Comment: Glucose reference range applies only to samples taken after fasting for at least 8 hours.  CBC     Status: Abnormal   Collection Time: 01/09/23  6:33 AM  Result Value Ref Range   WBC 7.6 4.0 - 10.5 K/uL   RBC 3.03 (L) 4.22 - 5.81 MIL/uL   Hemoglobin 9.5 (L) 13.0 - 17.0 g/dL   HCT 66.4 (L) 40.3 - 47.4 %   MCV 92.4 80.0 - 100.0 fL   MCH 31.4 26.0 - 34.0 pg   MCHC 33.9 30.0 - 36.0 g/dL   RDW 25.9 56.3 - 87.5 %   Platelets 232 150 - 400 K/uL   nRBC 0.0 0.0 - 0.2 %    Comment: Performed at [facility], [address] [place], [place] [address]  Heparin level (unfractionated)     Status: None   Collection Time: 01/09/23  6:33 AM  Result Value Ref Range   Heparin Unfractionated 0.44 0.30 - 0.70 IU/mL    Comment: (  NOTE) The clinical reportable range upper limit is  being lowered to >1.10 to align with the FDA approved guidance for the current laboratory assay.  If heparin results are below expected values, and patient dosage has  been confirmed, suggest follow up testing of antithrombin III levels. Performed at [facility], [address], [place], [place] [address]   Protime-INR     Status: Abnormal   Collection Time: 01/09/23  6:33 AM  Result Value Ref Range   Prothrombin Time 30.5 (H) 11.4 - 15.2 seconds   INR 2.9 (H) 0.8 - 1.2    Comment: (NOTE) INR goal varies based on device and disease states. Performed at [facility], [address], [place], [place] [address]   Glucose, capillary     Status: Abnormal   Collection Time: 01/09/23  7:29 AM  Result Value Ref Range   Glucose-Capillary 106 (H) 70 - 99 mg/dL    Comment: Glucose reference range applies only to samples taken after fasting for at least 8 hours.  Glucose, capillary     Status: Abnormal   Collection Time: 01/09/23 11:29 AM  Result Value Ref Range   Glucose-Capillary 146 (H) 70 - 99 mg/dL    Comment: Glucose reference range applies only to samples taken after fasting for at least 8 hours.   No results found.    Blood pressure (!) 171/93, pulse 87, temperature 98.2 F (36.8 C), temperature source Oral, resp. rate 16, height 5\' 6"  (1.676 m), weight 84.9 kg, SpO2 97%.  Medical Problem List and Plan: 1. Functional deficits secondary to ***  -patient may *** shower  -ELOS/Goals: ***  2.  Antithrombotics: -DVT/anticoagulation:  Pharmaceutical: Coumadin  -antiplatelet therapy: none  3. Pain Management: Tylenol, oxycodone 5 mg q 4 hrs prn as needed  -gabapentin 600 mg TID  4. Mood/Behavior/Sleep: LCSW to evaluate and provide emotional support  -trazodone 100 mg q HS prn  -antipsychotic agents: n/a  5. Neuropsych/cognition: This patient *** capable of making decisions on *** own behalf.  6. Skin/Wound Care: Routine skin care checks   7.  Fluids/Electrolytes/Nutrition: Routine Is and Os and follow-up chemistries  -carb modified diet  8: Hypertension: monitor TID and prn (losartan held due to AKI)  -continue Norvasc 5 mg daily  9: Hyperlipidemia: continue statin  10: DM-2 CBGs QID; A1c = 7.6% -continue Semglee 5 units daily -continue SSI -continue Novolog 3 units TID  11: Trigeminal neuralgia:  -continue Tegretol XR 200 mg QID -continue gabapentin 600 mg TID   12: Acute PE: on Coumadin, dosing per pharmacy consult (INR 2.9 11/11)  13: GERD: continue PPI  14: Anemia: fluctuating H and H  -follow-up CBC ***  Milinda Antis, PA-C 01/09/2023

## 2023-01-09 NOTE — Progress Notes (Signed)
Mobility Specialist Progress Note:   01/09/23 1501  Mobility  Activity Ambulated with assistance in hallway (LE Exercises)  Level of Assistance Contact guard assist, steadying assist  Assistive Device Front wheel walker  Distance Ambulated (ft) 45 ft  Activity Response Tolerated well  Mobility Referral Yes  $Mobility charge 1 Mobility  Mobility Specialist Start Time (ACUTE ONLY) 1435  Mobility Specialist Stop Time (ACUTE ONLY) 1443  Mobility Specialist Time Calculation (min) (ACUTE ONLY) 8 min   Pt received in chair, practiced LE exercises introduced by PT earlier. Ambulated in hallway with CGA and RW. Tolerated well, L knee buckling every 4th step. No LOB during session. Wife would chair follow for safety. Pt ambulated back to room, sitting up in chair comfortably, performed LE exercises for quad activation. Left pt in room, wife at bedside, all needs met.   Feliciana Rossetti Mobility Specialist Please contact via Special educational needs teacher or  Rehab office at [phone]

## 2023-01-09 NOTE — Progress Notes (Signed)
PHARMACY - ANTICOAGULATION CONSULT NOTE  Pharmacy Consult for Warfarin Indication:  rule out pulmonary embolus  No Known Allergies  Patient Measurements: Height: 5\' 6"  (167.6 cm) Weight: 84.9 kg (187 lb 2.7 oz) IBW/kg (Calculated) : 63.8  Vital Signs: Temp: 98.2 F (36.8 C) (11/11 0730) Temp Source: Oral (11/11 0730) BP: 171/93 (11/11 0730) Pulse Rate: 87 (11/11 0730)  Labs: Recent Labs    01/07/23 0503 01/08/23 0819 01/09/23 0633  HGB 8.6* 10.1* 9.5*  HCT 26.4* 30.4* 28.0*  PLT 193 209 232  LABPROT  --  18.9* 30.5*  INR  --  1.6* 2.9*  HEPARINUNFRC 0.30 0.41 0.44  CREATININE 1.02 0.87  --     Estimated Creatinine Clearance: 70.3 mL/min (by C-G formula based on SCr of 0.87 mg/dL).   Assessment:  82 y/o M with altered mental status, fever, tachypnea. CT anigo with possible PE. Heparin for now. Above labs reviewed. PTA meds reviewed. Pharmacy now consulted to initiate warfarin. Will continue heparin bridge per MD.   Significant DDI with warfarin: carbamazepine (decreases INR), metronidazole (increases INR)  11/11 AM update: INR 2.9  significant increase from yesterday (1.6) No signs of bleeding  Goal of Therapy:  INR: 2-3 Monitor platelets by anticoagulation protocol: Yes   Plan:  Discontinue heparin infusion secondary to significant increase in INR (1.6 >> 2.9) in 24 hours No coumadin today (01/09/2023) Monitor INR, and CBC daily Monitor for s/sx of bleeding  Greta Doom BS, PharmD, BCPS Clinical Pharmacist 01/09/2023 9:11 AM  Contact: [phone] after 3 PM  "Be curious, not judgmental..." -Debbora Dus

## 2023-01-09 NOTE — Consult Note (Signed)
Physical Medicine and Rehabilitation Consult Reason for Consult: Debility related to multiple medical Referring Physician: [place]   HPI: Larry Lane is a 82 y.o. male with a history of hypertension, diabetes, trigeminal neuralgia who presented on 01/04/2023 with abdominal pain as well as confusion.  Temperature upon admission was 103.  CT of the head was negative for acute changes although it it did demonstrate encephalomalacia within the right cerebellar hemisphere near the site of the right suboccipital craniotomy.  CT angiogram of the chest was notable for left upper lobe and left lower lobe pulmonary emboli and the patient was placed on a heparin drip.  CT of the abdomen and pelvis revealed segmental thickening of the walls of the terminal ileum consistent with a possible infectious versus inflammatory ileitis.  Patient was started on empiric antibiotics.  The lumbar puncture was performed due to his confusion which was unrevealing.  He is currently on IV ceftriaxone and p.o. metronidazole for coverage.  Blood cultures and CSF cultures have been negative.  Troponin was elevated initially with etiology likely secondary to type II demand ischemia in the setting of septic shock.  Renal function is improving and cognition is near baseline.  Patient was last up with physical therapy on 01/06/2023 and was mod assist for sit to stand transfers.  Gait was deferred due to confusion and left knee buckling.  He did work with OT yesterday and was min assist for sit to stand transfers but still demonstrated left knee buckling.  X-rays of the left knee incidentally have been unremarkable.  Patient lives in a 1 level house with 6 steps to enter.  He is modified independent with a rolling walker prior to admission although did have some falls over the last several months.   Review of Systems  Constitutional: Negative.   HENT: Negative.    Eyes: Negative.   Respiratory:  Positive for shortness of breath.    Cardiovascular: Negative.   Gastrointestinal: Negative.   Genitourinary: Negative.   Musculoskeletal:  Positive for falls and joint pain.  Skin: Negative.   Neurological:  Positive for sensory change and focal weakness.  Psychiatric/Behavioral:  Negative for depression.    Past Medical History:  Diagnosis Date   Abscess of back    Arthritis    Bacterial infection 2015   Cancer (HCC)    skin    Diabetes mellitus without complication (HCC)    Dysrhythmia    Edema    RIGHT ANKLE   GERD (gastroesophageal reflux disease)    Hypertension    Trigeminal nerve disorder    Trigeminal neuralgia    Past Surgical History:  Procedure Laterality Date   APPENDECTOMY  2010   CATARACT EXTRACTION W/PHACO Right 01/26/2017   Procedure: CATARACT EXTRACTION PHACO AND INTRAOCULAR LENS PLACEMENT (IOC);  Surgeon: Nevada Crane, MD;  Location: [facility] ORS;  Service: Ophthalmology;  Laterality: Right;  Lot # [id number] H [place]: 00:48.2 AP%: 9.9 CDE: 5.35   I&D of back abscess     TRIGEMINAL NERVE DECOMPRESSION     Family History  Problem Relation Age of Onset   Heart disease Father    Social History:  reports that he has never smoked. He has never used smokeless tobacco. He reports that he does not drink alcohol and does not use drugs. Allergies: No Known Allergies Medications Prior to Admission  Medication Sig Dispense Refill   acetaminophen (TYLENOL) 500 MG tablet Take 1,000 mg by mouth every 6 (six) hours as needed.  amLODipine (NORVASC) 5 MG tablet Take 1 tablet by mouth daily.     Apoaequorin 10 MG CAPS Take 1 capsule by mouth daily. Prevagen     aspirin EC 81 MG tablet Take 325 mg by mouth daily.     carbamazepine (CARBATROL) 200 MG 12 hr capsule Take 200 mg by mouth 4 (four) times daily.     cholestyramine light (PREVALITE) 4 GM/DOSE powder Take 4 g by mouth 2 (two) times daily with a meal.     gabapentin (NEURONTIN) 600 MG tablet Take 600 mg by mouth 3 (three) times daily.     glucagon  (GLUCAGON EMERGENCY) 1 MG injection Inject 1 mg into the skin.     insulin lispro (HUMALOG) 100 UNIT/ML injection Inject 20 Units into the skin once. Inject 20 units once per day; can bolus up to 56 units once a day as directed via VGO Pump     losartan (COZAAR) 50 MG tablet Take 50 mg daily by mouth.     Multiple Vitamin (MULTI-VITAMIN) tablet Take 1 tablet by mouth daily.     omeprazole (PRILOSEC) 20 MG capsule Take 20 mg by mouth daily.     rosuvastatin (CRESTOR) 10 MG tablet Take 1 tablet by mouth daily.      Home: Home Living Family/patient expects to be discharged to:: Private residence Living Arrangements: Spouse/significant other Available Help at Discharge: Family, Available 24 hours/day Type of Home: House Home Access: Stairs to enter [employer] of Steps: 6 Entrance Stairs-Rails: Right, Left Home Layout: One level, Other (Comment) (1 step down to living room) Bathroom Shower/Tub: Health visitor: Standard Bathroom Accessibility: Yes Home Equipment: Agricultural consultant (2 wheels) (wife may have given away)  Functional History: Prior Function Prior Level of Function : Independent/Modified Independent, Driving, History of Falls (last six months) Mobility Comments: ind, retired Visual merchandiser ADLs Comments: ind Functional Status:  Mobility: Bed Mobility Overal bed mobility: Needs Assistance Bed Mobility: Supine to Sit, Sit to Supine Supine to sit: Used rails, Min assist, HOB elevated Sit to supine: Min assist General bed mobility comments: Min assist to rise to EOB, max VC, supported LLE into and out of bed. Requires extra time. Increased Lt thigh pain while sitting unless he unload Lt leg. Transfers Overall transfer level: Needs assistance Equipment used: Rolling walker (2 wheels) Transfers: Sit to/from Stand, Bed to chair/wheelchair/BSC Sit to Stand: Min assist, From elevated surface Bed to/from chair/wheelchair/BSC transfer type:: Step pivot Step pivot  transfers: Min assist, From elevated surface (with use of RW) General transfer comment: Pt fatiguing quickly in standing and during step-pivot transfer with Left knee buckling. Ambulation/Gait Ambulation/Gait assistance: Contact guard assist Gait Distance (Feet): 40 Feet Assistive device: Rolling walker (2 wheels) Gait Pattern/deviations: Step-through pattern, Decreased stride length, Trunk flexed General Gait Details: Deferred due to confusion and Lt knee buckling Gait velocity: dec Gait velocity interpretation: <1.31 ft/sec, indicative of household ambulator    ADL: ADL Overall ADL's : Needs assistance/impaired Eating/Feeding: Set up, Sitting Grooming: Supervision/safety, Sitting Upper Body Bathing: Minimal assistance, Sitting Lower Body Bathing: Maximal assistance, Sit to/from stand Upper Body Dressing : Minimal assistance, Sitting Lower Body Dressing: Maximal assistance, Sit to/from stand Toilet Transfer: Minimal assistance, Ambulation, BSC/3in1, Rolling walker (2 wheels), Cueing for safety, Cueing for sequencing (step-pivot transfer) Toileting- Clothing Manipulation and Hygiene: Maximal assistance, Sit to/from stand Functional mobility during ADLs: Minimal assistance, Rolling walker (2 wheels) General ADL Comments: Pt presents with decreased activity tolerance during functional tasks.  Cognition: Cognition Overall Cognitive Status:  Impaired/Different from baseline Arousal/Alertness: Awake/alert Orientation Level: Oriented X4 Attention: Focused, Selective Focused Attention: Impaired Focused Attention Impairment: Verbal complex, Functional complex Selective Attention: Impaired Selective Attention Impairment: Verbal complex, Functional complex Memory: Impaired Memory Impairment: Decreased short term memory, Retrieval deficit Decreased Short Term Memory: Verbal basic, Functional basic Awareness: Appears intact Cognition Arousal: Alert Behavior During Therapy: WFL for tasks  assessed/performed Overall Cognitive Status: Impaired/Different from baseline Area of Impairment: Memory, Following commands, Problem solving, Orientation, Safety/judgement Orientation Level: Disoriented to, Place, Situation Memory: Decreased short-term memory Following Commands: Follows one step commands consistently, Follows one step commands with increased time Safety/Judgement: Decreased awareness of safety, Decreased awareness of deficits Problem Solving: Slow processing, Difficulty sequencing, Requires verbal cues General Comments: AAOx4 and pleasant throughout session.  Blood pressure (!) 171/93, pulse 87, temperature 98.2 F (36.8 C), temperature source Oral, resp. rate 16, height 5\' 6"  (1.676 m), weight 84.9 kg, SpO2 97%. Physical Exam Constitutional:      General: He is not in acute distress. Neurological:     Mental Status: He is alert.     Results for orders placed or performed during the hospital encounter of 01/04/23 (from the past 24 hour(s))  Glucose, capillary     Status: Abnormal   Collection Time: 01/08/23 11:43 AM  Result Value Ref Range   Glucose-Capillary 137 (H) 70 - 99 mg/dL  Glucose, capillary     Status: Abnormal   Collection Time: 01/08/23  5:11 PM  Result Value Ref Range   Glucose-Capillary 129 (H) 70 - 99 mg/dL  Glucose, capillary     Status: Abnormal   Collection Time: 01/08/23  9:37 PM  Result Value Ref Range   Glucose-Capillary 102 (H) 70 - 99 mg/dL  CBC     Status: Abnormal   Collection Time: 01/09/23  6:33 AM  Result Value Ref Range   WBC 7.6 4.0 - 10.5 K/uL   RBC 3.03 (L) 4.22 - 5.81 MIL/uL   Hemoglobin 9.5 (L) 13.0 - 17.0 g/dL   HCT 16.1 (L) 09.6 - 04.5 %   MCV 92.4 80.0 - 100.0 fL   MCH 31.4 26.0 - 34.0 pg   MCHC 33.9 30.0 - 36.0 g/dL   RDW 40.9 81.1 - 91.4 %   Platelets 232 150 - 400 K/uL   nRBC 0.0 0.0 - 0.2 %  Heparin level (unfractionated)     Status: None   Collection Time: 01/09/23  6:33 AM  Result Value Ref Range   Heparin  Unfractionated 0.44 0.30 - 0.70 IU/mL  Protime-INR     Status: Abnormal   Collection Time: 01/09/23  6:33 AM  Result Value Ref Range   Prothrombin Time 30.5 (H) 11.4 - 15.2 seconds   INR 2.9 (H) 0.8 - 1.2  Glucose, capillary     Status: Abnormal   Collection Time: 01/09/23  7:29 AM  Result Value Ref Range   Glucose-Capillary 106 (H) 70 - 99 mg/dL   No results found.  Assessment/Plan: Diagnosis: 82 year old male with debility after ileitis and pulmonary embolism.  Resolving metabolic encephalopathy. He has left quad weakness with sensory loss, ?occult femoral nerve injury---he says weakness is new.  Does the need for close, 24 hr/day medical supervision in concert with the patient's rehab needs make it unreasonable for this patient to be served in a less intensive setting? Yes Co-Morbidities requiring supervision/potential complications:  -ID considerations/IV antibiotics -Acute renal failure -Anticoagulation needed for treatment of pulmonary embolism -Type 2 diabetes -Hypertension Due to bladder management, bowel management, safety,  skin/wound care, disease management, medication administration, pain management, and patient education, does the patient require 24 hr/day rehab nursing? Yes Does the patient require coordinated care of a physician, rehab nurse, therapy disciplines of PT, OT to address physical and functional deficits in the context of the above medical diagnosis(es)? Yes Addressing deficits in the following areas: balance, endurance, locomotion, strength, transferring, bowel/bladder control, bathing, dressing, feeding, grooming, toileting, and psychosocial support Can the patient actively participate in an intensive therapy program of at least 3 hrs of therapy per day at least 5 days per week? Yes The potential for patient to make measurable gains while on inpatient rehab is excellent Anticipated functional outcomes upon discharge from inpatient rehab are supervision and min  assist  with PT, supervision and min assist with OT, n/a with SLP. Estimated rehab length of stay to reach the above functional goals is: 11-15 days Anticipated discharge destination: Home Overall Rehab/Functional Prognosis: excellent  POST ACUTE RECOMMENDATIONS: This patient's condition is appropriate for continued rehabilitative care in the following setting: CIR Patient has agreed to participate in recommended program. Yes Note that insurance prior authorization may be required for reimbursement for recommended care.  Comment: Rehab Admissions Coordinator to follow up.     MEDICAL RECOMMENDATIONS: Might benefit from a left knee brace to stabilize. If this is a femoral nerve injury perhaps related to positioning, it appears mild and should improve fairly quickly.   I have personally performed a face to face diagnostic evaluation of this patient. Additionally, I have examined the patient's medical record including any pertinent labs and radiographic images. If the physician assistant has documented in this note, I have reviewed and edited or otherwise concur with the physician assistant's documentation.  Thanks,  Ranelle Oyster, MD 01/09/2023

## 2023-01-09 NOTE — Progress Notes (Signed)
PMR Admission Coordinator Pre-Admission Assessment   Patient: Larry Lane is an 82 y.o., male MRN: [id number] DOB: 1940/12/09 Height: 5\' 6"  (167.6 cm) Weight: 84.9 kg   Insurance Information HMO: yes    PPO:      PCP:      IPA:      80/20:      OTHER:  PRIMARY: Blue Medicare       Policy#: [id number]       Subscriber: Pt CM Name: Carollee Herter      Phone#: [phone]     Fax#:  [phone] Pt. Approved by Carollee Herter 11/11 for 01/09/23  to 34/19/62  Pre-Cert#: [id number]      Employer:  Benefits:  Phone #: [phone]     Name:  Dolores Hoose Date: 02/28/2022 - 02/27/2198 Deductible: does not have one OOP Max: $3,500 ($110 met) CIR: $335/day co-pay for days 1-5, $0 copay for days 6-90 SNF: $0.00 Copayment per day for days 1-20; $203 Copayment per day for days 21-60; $0.00 copayment for days 61-100 Maximum of 100 days/benefit period Outpatient:  $10 copay/visit Home Health:  100% coverage DME: 80% coverage, 20% co-insurance Providers: in network    SECONDARY:       Policy#:      Phone#:    Artist:       Phone#:    The Data processing manager" for patients in Inpatient Rehabilitation Facilities with attached "Privacy Act Statement-Health Care Records" was provided and verbally reviewed with: Patient   Emergency Contact Information Contact Information       Name Relation Home Work Mobile    [place] A Spouse [phone]             Other Contacts   None on File        Current Medical History  Patient Admitting Diagnosis: Debility, Sepsis, PE History of Present Illness: Larry Lane is a 82 y.o. male with a history of hypertension, diabetes, trigeminal neuralgia who presented on 01/04/2023 with abdominal pain as well as confusion.  Temperature upon admission was 103.  CT of the head was negative for acute changes although it it did demonstrate encephalomalacia within the right cerebellar hemisphere near the site of the right suboccipital craniotomy.  CT  angiogram of the chest was notable for left upper lobe and left lower lobe pulmonary emboli and the patient was placed on a heparin drip.  CT of the abdomen and pelvis revealed segmental thickening of the walls of the terminal ileum consistent with a possible infectious versus inflammatory ileitis.  Patient was started on empiric antibiotics.  The lumbar puncture was performed due to his confusion which was unrevealing.  He is currently on IV ceftriaxone and p.o. metronidazole for coverage.  Blood cultures and CSF cultures have been negative.  Troponin was elevated initially with etiology likely secondary to type II demand ischemia in the setting of septic shock.  Renal function is improving and cognition is near baseline.  Patient was last up with physical therapy on 01/06/2023 and was mod assist for sit to stand transfers.  Gait was deferred due to confusion and left knee buckling.  He did work with OT yesterday and was min assist for sit to stand transfers but still demonstrated left knee buckling.  X-rays of the left knee incidentally have been unremarkable.  Patient lives in a 1 level house with 6 steps to enter.  He is modified independent with a rolling walker prior to admission although did have some falls over the  last several months. Pt. Was seen by PT/OT and they recommended CIR to assist return to PLOF.  Complete NIHSS TOTAL: 3   Patient's medical record from [facility] has been reviewed by the rehabilitation admission coordinator and physician.   Past Medical History      Past Medical History:  Diagnosis Date   Abscess of back     Arthritis     Bacterial infection 2015   Cancer (HCC)      skin    Diabetes mellitus without complication (HCC)     Dysrhythmia     Edema      RIGHT ANKLE   GERD (gastroesophageal reflux disease)     Hypertension     Trigeminal nerve disorder     Trigeminal neuralgia            Has the patient had major surgery during 100 days prior  to admission? Yes   Family History   family history includes Heart disease in his father.   Current Medications  Current Medications    Current Facility-Administered Medications:    acetaminophen (TYLENOL) tablet 1,000 mg, 1,000 mg, Oral, Q8H, Rocky Morel, DO, 1,000 mg at 01/08/23 2139   albuterol (PROVENTIL) (2.5 MG/3ML) 0.083% nebulizer solution 2.5 mg, 2.5 mg, Nebulization, Q6H PRN, Rocky Morel, DO   amLODipine (NORVASC) tablet 5 mg, 5 mg, Oral, Daily, Rocky Morel, DO, 5 mg at 01/09/23 0846   carbamazepine (TEGRETOL XR) 12 hr tablet 200 mg, 200 mg, Oral, QID, Rocky Morel, DO, 200 mg at 01/09/23 4098   Chlorhexidine Gluconate Cloth 2 % PADS 6 each, 6 each, Topical, Daily, Rocky Morel, DO, 6 each at 01/08/23 0929   docusate sodium (COLACE) capsule 100 mg, 100 mg, Oral, BID PRN, Rocky Morel, DO   gabapentin (NEURONTIN) capsule 600 mg, 600 mg, Oral, TID, Rocky Morel, DO, 600 mg at 01/09/23 0847   hydrALAZINE (APRESOLINE) injection 10 mg, 10 mg, Intravenous, Q6H PRN, Rocky Morel, DO, 10 mg at 01/07/23 0636   insulin aspart (novoLOG) injection 0-5 Units, 0-5 Units, Subcutaneous, QHS, Rocky Morel, DO, 2 Units at 01/06/23 2248   insulin aspart (novoLOG) injection 0-9 Units, 0-9 Units, Subcutaneous, TID WC, Rocky Morel, DO, 4 Units at 01/09/23 1230   insulin aspart (novoLOG) injection 3 Units, 3 Units, Subcutaneous, TID WC, Rocky Morel, DO, 3 Units at 01/09/23 1231   insulin glargine-yfgn (SEMGLEE) injection 5 Units, 5 Units, Subcutaneous, Daily, Rocky Morel, DO, 5 Units at 01/09/23 0857   metroNIDAZOLE (FLAGYL) tablet 500 mg, 500 mg, Oral, Q12H, Rocky Morel, DO, 500 mg at 01/09/23 0847   ondansetron (ZOFRAN) tablet 4 mg, 4 mg, Oral, Q6H PRN **OR** ondansetron (ZOFRAN) injection 4 mg, 4 mg, Intravenous, Q6H PRN, Rocky Morel, DO   Oral care mouth rinse, 15 mL, Mouth Rinse, 4 times per day, Rocky Morel, DO, 15 mL at 01/09/23 1231   Oral  care mouth rinse, 15 mL, Mouth Rinse, PRN, Rocky Morel, DO   oxyCODONE (Oxy IR/ROXICODONE) immediate release tablet 5 mg, 5 mg, Oral, Q4H PRN, Rocky Morel, DO, 5 mg at 01/05/23 2340   pantoprazole (PROTONIX) EC tablet 40 mg, 40 mg, Oral, Daily, Rocky Morel, DO, 40 mg at 01/09/23 0846   polyethylene glycol (MIRALAX / GLYCOLAX) packet 17 g, 17 g, Oral, Daily PRN, Rocky Morel, DO   rosuvastatin (CRESTOR) tablet 10 mg, 10 mg, Oral, Daily, Rocky Morel, DO, 10 mg at 01/09/23 0846   sodium chloride flush (NS) 0.9 % injection 10-40 mL, 10-40 mL,  Intracatheter, Q12H, Rocky Morel, DO, 10 mL at 01/09/23 0856   sodium chloride flush (NS) 0.9 % injection 10-40 mL, 10-40 mL, Intracatheter, PRN, Rocky Morel, DO   traZODone (DESYREL) tablet 100 mg, 100 mg, Oral, QHS PRN, Rocky Morel, DO, 100 mg at 01/06/23 0117   Warfarin - Pharmacist Dosing Inpatient, , Does not apply, q1600, Arma Heading, [facility], Given at 01/08/23 1631     Patients Current Diet:  Diet Order                  Diet regular Fluid consistency: Thin  Diet effective now                         Precautions / Restrictions Precautions Precautions: Fall Restrictions Weight Bearing Restrictions: No    Has the patient had 2 or more falls or a fall with injury in the past year? No   Prior Activity Level Community (5-7x/wk): Pt active in the community PTA   Prior Functional Level Self Care: Did the patient need help bathing, dressing, using the toilet or eating? Independent   Indoor Mobility: Did the patient need assistance with walking from room to room (with or without device)? Independent   Stairs: Did the patient need assistance with internal or external stairs (with or without device)? Independent   Functional Cognition: Did the patient need help planning regular tasks such as shopping or remembering to take medications? Independent   Patient Information Are you of Hispanic, Latino/a,or  Spanish origin?: A. No, not of Hispanic, Latino/a, or Spanish origin What is your race?: A. White Do you need or want an interpreter to communicate with a doctor or health care staff?: 0. No   Patient's Response To:  Health Literacy and Transportation Is the patient able to respond to health literacy and transportation needs?: Yes Health Literacy - How often do you need to have someone help you when you read instructions, pamphlets, or other written material from your doctor or pharmacy?: Never In the past 12 months, has lack of transportation kept you from medical appointments or from getting medications?: No In the past 12 months, has lack of transportation kept you from meetings, work, or from getting things needed for daily living?: No   Home Assistive Devices / Equipment Home Equipment: Agricultural consultant (2 wheels) (wife may have given away)   Prior Device Use: Indicate devices/aids used by the patient prior to current illness, exacerbation or injury? None of the above   Current Functional Level Cognition   Arousal/Alertness: Awake/alert Overall Cognitive Status: Impaired/Different from baseline Orientation Level: Oriented X4 Following Commands: Follows one step commands consistently, Follows one step commands with increased time Safety/Judgement: Decreased awareness of safety, Decreased awareness of deficits General Comments: Increased cues during session and wife reporting that pt is [facility] Attention: Focused, Selective Focused Attention: Impaired Focused Attention Impairment: Verbal complex, Functional complex Selective Attention: Impaired Selective Attention Impairment: Verbal complex, Functional complex Memory: Impaired Memory Impairment: Decreased short term memory, Retrieval deficit Decreased Short Term Memory: Verbal basic, Functional basic Awareness: Appears intact    Extremity Assessment (includes Sensation/Coordination)   Upper Extremity Assessment: Right hand dominant  (overall B UE strength 4- to 4/5) RUE Deficits / Details: edematous LUE Deficits / Details: edematous  Lower Extremity Assessment: Defer to PT evaluation LLE Deficits / Details: Ankle DF, PF, eversion, Knee flexion, hip extension, hip abduction grossly 5/5. However inconsistent with Lt knee extension and hip flexion ranging from 2/5 to  4/5. Seems to be pain limited at times. Unsure if guarding or tone with hip ROM, some pain with FABER and hip thrust. LLE: Unable to fully assess due to pain     ADLs   Overall ADL's : Needs assistance/impaired Eating/Feeding: Set up, Sitting Grooming: Supervision/safety, Sitting Upper Body Bathing: Minimal assistance, Sitting Lower Body Bathing: Maximal assistance, Sit to/from stand Upper Body Dressing : Minimal assistance, Sitting Lower Body Dressing: Maximal assistance, Sit to/from stand Toilet Transfer: Minimal assistance, Ambulation, BSC/3in1, Rolling walker (2 wheels), Cueing for safety, Cueing for sequencing (step-pivot transfer) Toileting- Clothing Manipulation and Hygiene: Maximal assistance, Sit to/from stand Functional mobility during ADLs: Minimal assistance, Rolling walker (2 wheels) General ADL Comments: Pt presents with decreased activity tolerance during functional tasks.     Mobility   Overal bed mobility: Needs Assistance Bed Mobility: Supine to Sit Supine to sit: Used rails, Min assist, HOB elevated Sit to supine: Min assist General bed mobility comments: Min A for LLE elevation, but pt able to advance to EOB. Cues for technique and use of rail and pt able to elevate trunk with increased time/effort, but no physical assist     Transfers   Overall transfer level: Needs assistance Equipment used: Rolling walker (2 wheels) Transfers: Sit to/from Stand Sit to Stand: Contact guard assist Bed to/from chair/wheelchair/BSC transfer type:: Step pivot Step pivot transfers: Min assist, From elevated surface (with use of RW) General transfer  comment: cues for safe hand placement and increased time to reach full upright posture     Ambulation / Gait / Stairs / Wheelchair Mobility   Ambulation/Gait Ambulation/Gait assistance: Contact guard assist, Min assist Gait Distance (Feet): 20 Feet Assistive device: Rolling walker (2 wheels) Gait Pattern/deviations: Step-through pattern, Decreased stride length, Trunk flexed General Gait Details: Pt demonstrating slow steps with cues for sequencing and RW management. L knee buckling in stance requiring intermittent min A for blocking, but pt able to support with BUE to prevent LOB. Cues for pacing at end of gait trial due to pt walking quicker with increased instability Gait velocity: dec Gait velocity interpretation: <1.31 ft/sec, indicative of household ambulator Pre-gait activities: weight shifting     Posture / Balance Dynamic Sitting Balance Sitting balance - Comments: sitting EOB Balance Overall balance assessment: Needs assistance Sitting-balance support: No upper extremity supported, Feet supported Sitting balance-Leahy Scale: Fair Sitting balance - Comments: sitting EOB Standing balance support: Bilateral upper extremity supported, During functional activity, Reliant on assistive device for balance Standing balance-Leahy Scale: Poor Standing balance comment: with RW support     Special needs/care consideration Skin intact and Special service needs IV rocephin, heparin drip    Previous Home Environment (from acute therapy documentation) Living Arrangements: Spouse/significant other Available Help at Discharge: Family, Available 24 hours/day Type of Home: House Home Layout: One level, Other (Comment) (1 step down to living room) Home Access: Stairs to enter Entrance Stairs-Rails: Right, Left Entrance Stairs-Number of Steps: 6 Bathroom Shower/Tub: Health visitor: Administrator Accessibility: Yes Home Care Services: No   Discharge Living Setting Plans  for Discharge Living Setting: Patient's home Type of Home at Discharge: House Discharge Home Layout: One level Discharge Home Access: Stairs to enter Entrance Stairs-Rails: Right, Left Entrance Stairs-Number of Steps: 6 Discharge Bathroom Shower/Tub: Walk-in shower Discharge Bathroom Toilet: Standard Discharge Bathroom Accessibility: Yes How Accessible: Accessible via walker, Accessible via wheelchair Does the patient have any problems obtaining your medications?: No   Social/Family/Support Systems Patient Roles: Spouse Contact Information: [phone] Anticipated Caregiver:  Vonna Kotyk Anticipated Caregiver's Contact Information: 24/7 min A Caregiver Availability: 24/7 Discharge Plan Discussed with Primary Caregiver: Yes Is Caregiver In Agreement with Plan?: No Does Caregiver/Family have Issues with Lodging/Transportation while Pt is in Rehab?: Yes   Goals Patient/Family Goal for Rehab: PT/OT/SLP Supervision level Expected length of stay: 7-10 days Pt/Family Agrees to Admission and willing to participate: Yes Program Orientation Provided & Reviewed with Pt/Caregiver Including Roles  & Responsibilities: Yes   Decrease burden of Care through IP rehab admission: not anticipated   Possible need for SNF placement upon discharge: not anticiapted   Patient Condition: This patient's medical and functional status has changed since the consult dated: 01/09/23 in which the Rehabilitation Physician determined and documented that the patient's condition is appropriate for intensive rehabilitative care in an inpatient rehabilitation facility After evaluating the patient today and speaking with the Rehabilitation physician and acute team, the patient remains appropriate for inpatient rehab. Will admit to inpatient rehab today.   Preadmission Screen Completed By:  Jeronimo Greaves, 01/09/2023 1:11 PM ______________________________________________________________________   Discussed status with  Dr. Riley Kill on 01/09/23 at 930 and received approval for admission today.   Admission Coordinator:  Moises Blood, time 1501/Date 01/09/23    MD Signature: Ranelle Oyster, MD, [facility] Physical Medicine & Rehabilitation Medical Director Rehabilitation Services 01/09/2023

## 2023-01-09 NOTE — Plan of Care (Signed)
  Problem: Coping: Goal: Ability to adjust to condition or change in health will improve Outcome: Progressing   Problem: Skin Integrity: Goal: Risk for impaired skin integrity will decrease Outcome: Progressing   Problem: Activity: Goal: Risk for activity intolerance will decrease Outcome: Progressing   Problem: Coping: Goal: Level of anxiety will decrease Outcome: Progressing   Problem: Safety: Goal: Ability to remain free from injury will improve Outcome: Progressing

## 2023-01-09 NOTE — Discharge Summary (Signed)
Physician Discharge Summary  Larry Lane ZOX:[id number] DOB: 1940/04/22 DOA: 01/04/2023  PCP: Jerl Mina, MD  Admit date: 01/04/2023 Discharge date: 01/09/2023  Admitted From: Home Disposition: CIR  Recommendations for Outpatient Follow-up:  Follow up with PCP in 1-2 weeks Continue warfarin for acute pulmonary embolism, will need close monitoring to determine baseline dose as Newstart, recommend following INR daily Discontinued losartan for now, may consider restart if creatinine remains stable, recommend repeat BMP in 2-3 days Will need to complete 1 final dose of metronidazole the evening 01/09/2023 to complete 7-day course  Discharge Condition: Stable CODE STATUS: Full code Diet recommendation: Heart healthy/consistent carbohydrate diet  History of present illness:  Larry Lane is a 82 y.o. male with past medical history significant for HTN, HLD, DM2, trigeminal neuralgia, GERD, osteoarthritis who presented to [facility] ED on 01/04/2023 with complaints of abdominal pain.  Spouse also noted patient seemed confused with rigors night prior to ED presentation.  Denied urinary symptoms, no nausea/vomiting/diarrhea.  Initially admitted to the hospitalist service but significant decompensation with low blood pressure, tachycardia, elevated fever prompted referral to pulmonary/critical care medicine and ultimately was admitted to the intensive care unit same day.   Initial workup in the ED with temperature 103.0 F, HR 118, BP 74/62, SpO2 95% on room air.  WBC 8.0, hemoglobin 11.7, platelet count 271.  Sodium 132, potassium 3.9, chloride 103, CO2 19, glucose 182, BUN 21, creatinine 1.34.  AST 40, ALT 29, total bilirubin 0.7.  High sensitivity troponin 47>77.  INR 1.0.  Urinalysis unrevealing.  Chest x-ray with no acute cardiopulmonary disease process.  CT head without contrast with no acute intracranial [name], noted right suboccipital craniotomy with focal encephalomalacia within right  cerebellar hemisphere.  CT angiogram chest with questionable left upper lobe and left lower lobe pulmonary emboli, RV mildly distended consistent with right heart strain, coronary artery calcifications, aortic atherosclerosis.  CT abdomen/pelvis segmental thickening walls of the terminal ileum possible infectious versus inflammatory ileitis with dilation of the small bowel proximal to the segment, small hiatal hernia.  Blood cultures x 2 were obtained.  Patient was started on empiric antibiotics.  Initially admitted to the hospital service but given decompensation was transferred to the ICU same day due to septic shock with requirement of vasopressors.   Significant Hospital events: 11/6: Initially admitted to hospitalist service, transferred to ICU for septic shock requiring vasopressors, underwent lumbar puncture with CSF analysis unrevealing. 11/7: Continues to require vasopressors, intermittent fevers 11/8: Vasopressors titrated off; left knee x-ray unrevealing 11/9: Transferred back to hospitalist service 11/11: Heparin drip discontinued as INR now 2.9; discharging to [facility] course:  Septic shock 2/2 ileitis Patient presenting to ED with abdominal pain.  Noted to be febrile, tachycardic, hypotensive on admission.  Initially requiring vasopressor support and admitted to the intensive care unit.  Patient was started on empiric antibiotics due to suspected ileitis.  Underwent a lumbar puncture due to confusion/encephalopathy which was unrevealing.  Chest x-ray/CT angiogram chest negative for focal consolidation.  Left knee x-ray with no acute finding.  Urinalysis unrevealing.  MRSA PCR negative.  Blood culture showed no growth.  CSF cultures with no growth x 3 days and meningitis/encephalitis panel was negative.  Patient completed 7-day course of IV ceftriaxone.  Patient has 1 remaining dose of metronidazole p.o. to take evening 01/09/2023.   Elevated troponin secondary to type II demand  ischemia High sensitive troponin elevated at 47>77> 3448> 2150.  Etiology likely secondary to type II demand ischemia  in the setting of septic shock as above from underlying ileitis versus submassive pulmonary embolism.   Acute renal failure: resolved Patient presenting with a creatinine of 1.34, peaked at 1.69.  Etiology likely secondary to prerenal azotemia in the setting of dehydration and poor oral intake in the days preceding hospitalization versus ATN from septic shock/hypotension.  Supported with IV fluid hydration, vasopressors.  Creatinine improved to 0.87 at time of discharge.  Home losartan has been discontinued.  Recommend repeat BMP 2-3 days.  Can consider restarting losartan at that time.   Submassive pulmonary embolism CT angiogram chest with left upper lobe/left lower lobe pulmonary emboli, RV mildly distended consistent with right heart strain.  Troponin elevated.  TTE with LVEF 70 either 75%, LV with no regional wall motion abnormalities, RV systolic function mildly reduced, hypokinetic segments with hyperdynamic apex consistent with McConnell sign, RV size moderately enlarged, mild TR, IVC normal in size.  Patient was started on heparin drip.  Unable to transition to NOAC/DOAC/enoxaparin due to interactions with carbamazepine.  Started on warfarin.  Heparin drip discontinued on 01/09/2023 with INR reaching 2.9.  Continue warfarin.  Recommend pharmacy to continue to monitor/dose on transition to CIR in order to determine basal dosing needs.  Recommend INR daily.   Acute metabolic encephalopathy: Resolved Patient presenting to ED with confusion, notably secondary to septic shock with hypoperfusion as above.  CT head, CSF analysis unrevealing.  Mental status now back to his typical baseline.   Type 2 diabetes mellitus with hyperglycemia Hemoglobin A1c 7.6, not optimally controlled.  Home regimen includes Humalog 20 units once daily.  Discharging on Semglee 5 units [name] daily and NovoLog 3  units TIDAC as his glucose has remained stable for several days with this regimen.   Essential hypertension Home regimen includes amlodipine 5 mg p.o. daily, losartan 50 mg p.o. daily.  Continue amlodipine, holding losartan for now given renal failure on admission.  Recommend repeat BMP 1 week as above.  May resume if renal function remains stable.   Hyperlipidemia Crestor 10 mg p.o. daily   Trigeminal neuralgia Carbamazepine 200 mg p.o. 3 times daily, Gabapentin 600 mg p.o. 3 times daily   GERD -- Protonix 40 mg p.o. daily (substituted for home omeprazole)   Weakness/debility/deconditioning/gait disturbance Seen by PT/OT with recommendation of CIR admission on discharge  Discharge Diagnoses:  Principal Problem:   Sepsis (HCC) Active Problems:   Diabetes mellitus (HCC)   SIRS (systemic inflammatory response syndrome) (HCC)   Partial obstruction of small intestine (HCC)   AKI (acute kidney injury) (HCC)   Septic shock (HCC)   Hyperglycemia   Ileitis   Acute metabolic encephalopathy    Discharge Instructions  Discharge Instructions     Call MD for:  difficulty breathing, headache or visual disturbances   Complete by: As directed    Call MD for:  extreme fatigue   Complete by: As directed    Call MD for:  persistant dizziness or light-headedness   Complete by: As directed    Call MD for:  persistant nausea and vomiting   Complete by: As directed    Call MD for:  severe uncontrolled pain   Complete by: As directed    Call MD for:  temperature >100.4   Complete by: As directed    Diet - low sodium heart healthy   Complete by: As directed    Increase activity slowly   Complete by: As directed       Allergies as of 01/09/2023  No Known Allergies      Medication List     STOP taking these medications    insulin lispro 100 UNIT/ML injection Commonly known as: HUMALOG   losartan 50 MG tablet Commonly known as: COZAAR       TAKE these medications     acetaminophen 500 MG tablet Commonly known as: TYLENOL Take 1,000 mg by mouth every 6 (six) hours as needed.   amLODipine 5 MG tablet Commonly known as: NORVASC Take 1 tablet by mouth daily.   Apoaequorin 10 MG Caps Take 1 capsule by mouth daily. Prevagen   aspirin EC 81 MG tablet Take 325 mg by mouth daily.   carbamazepine 200 MG 12 hr capsule Commonly known as: CARBATROL Take 200 mg by mouth 4 (four) times daily.   cholestyramine light 4 GM/DOSE powder Commonly known as: PREVALITE Take 4 g by mouth 2 (two) times daily with a meal.   gabapentin 600 MG tablet Commonly known as: NEURONTIN Take 600 mg by mouth 3 (three) times daily.   glucagon 1 MG injection Inject 1 mg into the skin.   insulin aspart 100 UNIT/ML injection Commonly known as: novoLOG Inject 3 Units into the skin 3 (three) times daily with meals.   insulin glargine-yfgn 100 UNIT/ML injection Commonly known as: SEMGLEE Inject 0.05 mLs (5 Units total) into the skin daily. Start taking on: January 10, 2023   metroNIDAZOLE 500 MG tablet Commonly known as: FLAGYL Take 1 tablet (500 mg total) by mouth every 12 (twelve) hours.   Multi-Vitamin tablet Take 1 tablet by mouth daily.   omeprazole 20 MG capsule Commonly known as: PRILOSEC Take 20 mg by mouth daily.   rosuvastatin 10 MG tablet Commonly known as: CRESTOR Take 1 tablet by mouth daily.        Follow-up Information     Jerl Mina, MD Follow up.   Specialty: Family Medicine Contact information: [address] [facility] [place] [address] [phone]                No Known Allergies  Consultations: PCCM   Procedures/Studies: DG Knee Complete 4 Views Left  Result Date: 01/06/2023 CLINICAL DATA:  Left thigh pain without known injury. EXAM: LEFT KNEE - COMPLETE 4+ VIEW COMPARISON:  None Available. FINDINGS: Status post left medial hemiarthroplasty. No fracture or dislocation is noted. Vascular  calcifications are noted. IMPRESSION: No acute abnormality seen. Electronically Signed   By: Lupita Raider M.D.   On: 01/06/2023 14:28   DG FEMUR MIN 2 VIEWS LEFT  Result Date: 01/06/2023 CLINICAL DATA:  Left leg pain without known injury. EXAM: LEFT FEMUR 2 VIEWS COMPARISON:  None Available. FINDINGS: There is no evidence of fracture or other focal bone lesions. Soft tissues are unremarkable. Status post left medial hemiarthroplasty. IMPRESSION: No acute abnormality seen. Electronically Signed   By: Lupita Raider M.D.   On: 01/06/2023 14:26   DG HIP UNILAT WITH PELVIS MIN 4 VIEWS LEFT  Result Date: 01/06/2023 CLINICAL DATA:  Left hip pain without known injury. EXAM: DG HIP (WITH OR WITHOUT PELVIS) 4+V LEFT COMPARISON:  None Available. FINDINGS: There is no evidence of hip fracture or dislocation. Minimal osteophyte formation of left hip is noted. 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 No acute abnormality seen. Electronically Signed   By: Lupita Raider M.D.   On: 01/06/2023 14:25   VAS [place] LOWER EXTREMITY VENOUS (DVT)  Result Date: 01/05/2023  Lower Venous DVT Study  Patient Name:  Larry Lane  Date of Exam:   01/05/2023 Medical Rec #: [id number]        Accession #:    [id number] Date of Birth: 06/16/1940       Patient Gender: M Patient Age:   88 years Exam Location:  [facility] Procedure:      VAS [place] LOWER EXTREMITY VENOUS (DVT) Referring Phys: Karie Fetch --------------------------------------------------------------------------------  Indications: Edema, and pulmonary embolism.  Risk Factors: Confirmed PE. Comparison Study: No prior exam. Performing Technologist: Fernande Bras  Examination Guidelines: A complete evaluation includes B-mode imaging, spectral Doppler, color Doppler, and power Doppler as needed of all accessible portions of each vessel. Bilateral testing is considered an integral part of a complete examination. Limited examinations for  reoccurring indications may be performed as noted. The reflux portion of the exam is performed with the patient in reverse Trendelenburg.  +---------+---------------+---------+-----------+----------+--------------+ RIGHT    CompressibilityPhasicitySpontaneityPropertiesThrombus Aging +---------+---------------+---------+-----------+----------+--------------+ CFV      Full           Yes      Yes                                 +---------+---------------+---------+-----------+----------+--------------+ SFJ      Full                                                        +---------+---------------+---------+-----------+----------+--------------+ FV Prox  Full                                                        +---------+---------------+---------+-----------+----------+--------------+ FV Mid   Full                                                        +---------+---------------+---------+-----------+----------+--------------+ FV DistalFull                                                        +---------+---------------+---------+-----------+----------+--------------+ PFV      Full                                                        +---------+---------------+---------+-----------+----------+--------------+ POP      Full           Yes      Yes                                 +---------+---------------+---------+-----------+----------+--------------+ PTV      Full                                                        +---------+---------------+---------+-----------+----------+--------------+  PERO     Full                                                        +---------+---------------+---------+-----------+----------+--------------+   +---------+---------------+---------+-----------+----------+--------------+ LEFT     CompressibilityPhasicitySpontaneityPropertiesThrombus Aging  +---------+---------------+---------+-----------+----------+--------------+ CFV      Full           Yes      Yes                                 +---------+---------------+---------+-----------+----------+--------------+ SFJ      Full                                                        +---------+---------------+---------+-----------+----------+--------------+ FV Prox  Full                                                        +---------+---------------+---------+-----------+----------+--------------+ FV Mid   Full                                                        +---------+---------------+---------+-----------+----------+--------------+ FV DistalFull                                                        +---------+---------------+---------+-----------+----------+--------------+ PFV      Full                                                        +---------+---------------+---------+-----------+----------+--------------+ POP      Full           Yes      Yes                                 +---------+---------------+---------+-----------+----------+--------------+ PTV      Full                                                        +---------+---------------+---------+-----------+----------+--------------+ PERO     Full                                                        +---------+---------------+---------+-----------+----------+--------------+  Patient stated he's experiencing pain on anterior mid portion on left thigh. Upon scanning an isolated lymph node was observed. No images documented.    Summary: BILATERAL: - No evidence of deep vein thrombosis seen in the lower extremities, bilaterally. -No evidence of popliteal cyst, bilaterally.   *See table(s) above for measurements and observations. Electronically signed by Heath Lark on 01/05/2023 at 6:03:20 PM.    Final    ECHOCARDIOGRAM COMPLETE  Result Date: 01/05/2023     ECHOCARDIOGRAM REPORT   Patient Name:   Larry Lane Date of Exam: 01/05/2023 Medical Rec #:  [id number]       Height:       66.0 in Accession #:    [id number]      Weight:       184.7 lb Date of Birth:  30-Jan-1941      BSA:          1.933 m Patient Age:    81 years        BP:           121/46 mmHg Patient Gender: M               HR:           76 bpm. Exam Location:  Inpatient Procedure: 2D Echo, Cardiac Doppler, Color Doppler and Intracardiac            Opacification Agent Indications:    Abnormal ECG 794.31/ R94.31                 Elevated Troponin  History:        Patient has no prior history of Echocardiogram examinations.                 Risk Factors:Hypertension, Diabetes and Dyslipidemia.  Sonographer:    Lucendia Herrlich RCS Referring Phys: [id number] BINAYA DAHAL IMPRESSIONS  1. Right ventricular systolic function is mildly reduced most notably at the mid RV free wall, hypokinetic segment, with hyperdynamic apex consistent with McConnell's sign. Consider ruling out PE. The right ventricular size is moderately enlarged. There  is normal pulmonary artery systolic pressure. RVSP 24 mmHg.  2. Left ventricular ejection fraction, by estimation, is 70 to 75%. The left ventricle has hyperdynamic function, with flow acceleration at the basal septum without evidence of LVOT obstruction. The left ventricle has no regional wall motion abnormalities. Left ventricular diastolic parameters are indeterminate.  3. The mitral valve is normal in structure. Trivial mitral valve regurgitation. No evidence of mitral stenosis.  4. The aortic valve is tricuspid. There is mild calcification of the aortic valve. Aortic valve regurgitation is not visualized. Aortic valve sclerosis/calcification is present, without any evidence of aortic stenosis.  5. The inferior vena cava is normal in size with <50% respiratory variability, suggesting right atrial pressure of 8 mmHg. FINDINGS  Left Ventricle: Left ventricular ejection fraction, by  estimation, is 70 to 75%. The left ventricle has hyperdynamic function. The left ventricle has no regional wall motion abnormalities. Definity contrast agent was given IV to delineate the left ventricular endocardial borders. The left ventricular internal cavity size was normal in size. There is no left ventricular hypertrophy. Left ventricular diastolic parameters are indeterminate. Right Ventricle: The right ventricular size is moderately enlarged. No increase in right ventricular wall thickness. Right ventricular systolic function is mildly reduced. There is normal pulmonary artery systolic pressure. The tricuspid regurgitant velocity is 2.01 m/s, and with an assumed right atrial pressure of 8 mmHg, the estimated right  ventricular systolic pressure is 24.2 mmHg. Left Atrium: Left atrial size was normal in size. Right Atrium: Right atrial size was normal in size. Pericardium: There is no evidence of pericardial effusion. Mitral Valve: The mitral valve is normal in structure. There is mild calcification of the mitral valve leaflet(s). Trivial mitral valve regurgitation. No evidence of mitral valve stenosis. The mean mitral valve gradient is 3.2 mmHg. Tricuspid Valve: The tricuspid valve is normal in structure. Tricuspid valve regurgitation is trivial. No evidence of tricuspid stenosis. Aortic Valve: The aortic valve is tricuspid. There is mild calcification of the aortic valve. Aortic valve regurgitation is not visualized. Aortic valve sclerosis/calcification is present, without any evidence of aortic stenosis. Aortic valve mean gradient measures 7.0 mmHg. Aortic valve peak gradient measures 12.2 mmHg. Aortic valve area, by VTI measures 1.83 cm. Pulmonic Valve: The pulmonic valve was normal in structure. Pulmonic valve regurgitation is trivial. No evidence of pulmonic stenosis. Aorta: The aortic root is normal in size and structure. Ascending aorta measurements are within normal limits for age when indexed to body  surface area. Venous: The inferior vena cava is normal in size with less than 50% respiratory variability, suggesting right atrial pressure of 8 mmHg. IAS/Shunts: No atrial level shunt detected by color flow Doppler.  LEFT VENTRICLE PLAX 2D LVIDd:         4.30 cm   Diastology LVIDs:         2.70 cm   LV e' medial:    9.48 cm/s LV PW:         1.00 cm   LV E/e' medial:  13.2 LV IVS:        0.70 cm   LV e' lateral:   9.48 cm/s LVOT diam:     2.10 cm   LV E/e' lateral: 13.2 LV SV:         61 LV SV Index:   31 LVOT Area:     3.46 cm  RIGHT VENTRICLE             IVC RV S prime:     19.60 cm/s  IVC diam: 1.70 cm TAPSE (M-mode): 3.3 cm LEFT ATRIUM             Index        RIGHT ATRIUM           Index LA diam:        4.60 cm 2.38 cm/m   RA Area:     12.90 cm LA Vol (A2C):   61.8 ml 31.94 ml/m  RA Volume:   26.10 ml  13.50 ml/m LA Vol (A4C):   68.3 ml 35.32 ml/m LA Biplane Vol: 63.7 ml 32.95 ml/m  AORTIC VALVE AV Area (Vmax):    1.94 cm AV Area (Vmean):   1.82 cm AV Area (VTI):     1.83 cm AV Vmax:           175.00 cm/s AV Vmean:          118.000 cm/s AV VTI:            0.331 m AV Peak Grad:      12.2 mmHg AV Mean Grad:      7.0 mmHg LVOT Vmax:         98.17 cm/s LVOT Vmean:        62.167 cm/s LVOT VTI:          0.175 m LVOT/AV VTI ratio: 0.53  AORTA Ao Root diam: 3.80 cm Ao  Asc diam:  3.70 cm MITRAL VALVE                TRICUSPID VALVE MV Area (PHT): 2.77 cm     TR Peak grad:   16.2 mmHg MV Mean grad:  3.2 mmHg     TR Vmax:        201.00 cm/s MV Decel Time: 274 msec MV E velocity: 125.50 cm/s  SHUNTS MV A velocity: 133.00 cm/s  Systemic VTI:  0.18 m MV E/A ratio:  0.94         Systemic Diam: 2.10 cm Weston Brass MD Electronically signed by Weston Brass MD Signature Date/Time: 01/05/2023/11:32:01 AM    Final    MR LUMBAR SPINE W WO CONTRAST  Result Date: 01/05/2023 CLINICAL DATA:  Low back pain.  Recent lumbar puncture. EXAM: MRI LUMBAR SPINE WITHOUT AND WITH CONTRAST TECHNIQUE: Multiplanar and multiecho  pulse sequences of the lumbar spine were obtained without and with intravenous contrast. CONTRAST:  8mL GADAVIST GADOBUTROL 1 MMOL/ML IV SOLN COMPARISON:  None Available. FINDINGS: Segmentation:  Standard. Alignment:  Grade 1 anterolisthesis at L4-5 Vertebrae: No acute fracture or discitis-osteomyelitis. There is increased soft tissue surrounding the posterior elements at the L4-5 level, with close apposition of the L4 and L5 spinous processes and edema of the inter spinous ligament. Conus medullaris and cauda equina: Conus extends to the L1 level. There are small outpouchings of the ligamentum flavum at L3-4 and L4-5, which show mild contrast enhancement. No epidural collection. Paraspinal and other soft tissues: Fatty atrophy of the lower paraspinous muscles. Disc levels: L1-L2: Normal disc space and facet joints. No spinal canal stenosis. No neural foraminal stenosis. L2-L3: Normal disc space and facet joints. No spinal canal stenosis. No neural foraminal stenosis. L3-L4: Mild ligamentum flavum infolding. No spinal canal stenosis. No neural foraminal stenosis. L4-L5: Moderate facet arthrosis with ligamentum flavum infolding. Mild spinal canal stenosis. Severe right and moderate left neural foraminal stenosis. L5-S1: Normal disc space and facet joints. No spinal canal stenosis. No neural foraminal stenosis. Visualized sacrum: Normal. IMPRESSION: 1. No discitis-osteomyelitis or epidural collection. 2. 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 3. Close approximation of the spinous processes at the L3-4 and L4-5 levels with edema of the interspinous ligament, most consistent with Baastrup disease. Electronically Signed   By: Deatra Robinson M.D.   On: 01/05/2023 01:43   [place] EKG SITE RITE  Result Date: 01/04/2023 If Site Rite image not attached, placement could not be confirmed due to current cardiac rhythm.  DG FL GUIDED LUMBAR PUNCTURE  Result Date:  01/04/2023 CLINICAL DATA:  82 year old male with encephalopathy, fevers. Lumbar puncture requested. EXAM: LUMBAR PUNCTURE UNDER FLUOROSCOPY PROCEDURE: An appropriate skin entry site was determined fluoroscopically. Operator donned sterile gloves and mask. Skin site was marked, then prepped with Betadine, draped in usual sterile fashion, and infiltrated locally with 1% lidocaine. A 20 gauge spinal needle advanced into the thecal sac at L3-L4 from a left interlaminar approach. Clear colorless CSF spontaneously returned, with opening pressure of 15 cm water. 12 ml CSF were collected and divided among 4 sterile vials for the requested laboratory studies. The needle was then removed. The patient tolerated the procedure well and there were no complications. FLUOROSCOPY: Radiation Exposure Index (as provided by the fluoroscopic device): 12.6 mGy Kerma IMPRESSION: Technically successful lumbar puncture under fluoroscopy. This exam was performed by Loyce Dys PA-C, and was supervised and interpreted by Leanna Battles,  MD. Electronically Signed   By: Leanna Battles M.D.   On: 01/04/2023 16:44   CT Angio Chest PE W/Cm &/Or Wo Cm  Result Date: 01/04/2023 CLINICAL DATA:  Pulmonary embolism suspected, high probability. Altered mental status, tachypnea. Sepsis. EXAM: CT ANGIOGRAPHY CHEST CT ABDOMEN AND PELVIS WITH CONTRAST TECHNIQUE: Multidetector CT imaging of the chest was performed using the standard protocol during bolus administration of intravenous contrast. Multiplanar CT image reconstructions and MIPs were obtained to evaluate the vascular anatomy. Multidetector CT imaging of the abdomen and pelvis was performed using the standard protocol during bolus administration of intravenous contrast. RADIATION DOSE REDUCTION: This exam was performed according to the departmental dose-optimization program which includes automated exposure control, adjustment of the mA and/or kV according to patient size and/or use of  iterative reconstruction technique. CONTRAST:  75mL OMNIPAQUE IOHEXOL 350 MG/ML SOLN COMPARISON:  05/21/2013. FINDINGS: CTA CHEST FINDINGS Cardiovascular: The heart is borderline enlarged and there is no pericardial effusion. Multi-vessel coronary artery calcifications are noted. There is atherosclerotic calcification of the aorta without evidence of aneurysm. Pulmonary trunk is normal in caliber. There are questionable pulmonary emboli in the left upper and lower lobes. Evaluation is limited due to mixing artifact and respiratory motion. The right ventricle is distended, not significantly changed from 2015, however possibility right heart strain can not be excluded. Mediastinum/Nodes: No enlarged mediastinal, hilar, or axillary lymph nodes. Thyroid gland, trachea, and esophagus demonstrate no significant findings. There is a small hiatal hernia. Lungs/Pleura: Mild atelectasis is noted bilaterally. No effusion or pneumothorax is seen. Musculoskeletal: Degenerative changes are present in the thoracic spine. No acute osseous abnormality is seen. Review of the MIP images confirms the above findings. CT ABDOMEN and PELVIS FINDINGS Hepatobiliary: No focal liver abnormality is seen. No gallstones, gallbladder wall thickening, or biliary dilatation. Pancreas: Pancreatic atrophy is noted. No pancreatic ductal dilatation or surrounding inflammatory changes. Spleen: Normal in size without focal abnormality. Adrenals/Urinary Tract: The adrenal glands are within normal limits. The kidneys enhance symmetrically. Subcentimeter hypodensities are present in the kidneys bilaterally which are too small to further characterize. No renal calculus or obstructive uropathy. Bladder is unremarkable. Stomach/Bowel: There is a small hiatal hernia. The stomach is otherwise within normal limits. Segmental thickening of the walls of the terminal ileum is noted with multiple dilated loops of small bowel proximal to this segment abdomen measuring  up to 4.1 cm. The appendix is not seen and surgical changes are noted at the cecum. No free air or pneumatosis is seen. Vascular/Lymphatic: Aortic atherosclerosis. No enlarged abdominal or pelvic lymph nodes. Reproductive: Prostate gland is mildly enlarged. Other: A small amount of free fluid is noted in the pelvis. Musculoskeletal: Degenerative changes are present in the lumbar spine. No acute osseous abnormality is seen. Review of the MIP images confirms the above findings. IMPRESSION: 1. Questionable left upper lobe and left lower lobe pulmonary emboli. Examination is limited due to mixing artifact and respiratory motion. The right ventricle is mildly distended, which is present on a previous exam, however the possibility of right heart strain can not be excluded. Consider repeat evaluation or V/Q scan for confirmation. 2. Segmental thickening of the walls of the terminal ileum, possible infectious or inflammatory ileitis with dilatation of the small bowel proximal to this segment, resulting in partial or early small bowel obstruction. 3. Small hiatal hernia. 4. Coronary artery calcifications. 5. Aortic atherosclerosis. Electronically Signed   By: Thornell Sartorius M.D.   On: 01/04/2023 04:25   CT ABDOMEN PELVIS W  CONTRAST  Result Date: 01/04/2023 CLINICAL DATA:  Pulmonary embolism suspected, high probability. Altered mental status, tachypnea. Sepsis. EXAM: CT ANGIOGRAPHY CHEST CT ABDOMEN AND PELVIS WITH CONTRAST TECHNIQUE: Multidetector CT imaging of the chest was performed using the standard protocol during bolus administration of intravenous contrast. Multiplanar CT image reconstructions and MIPs were obtained to evaluate the vascular anatomy. Multidetector CT imaging of the abdomen and pelvis was performed using the standard protocol during bolus administration of intravenous contrast. RADIATION DOSE REDUCTION: This exam was performed according to the departmental dose-optimization program which includes  automated exposure control, adjustment of the mA and/or kV according to patient size and/or use of iterative reconstruction technique. CONTRAST:  75mL OMNIPAQUE IOHEXOL 350 MG/ML SOLN COMPARISON:  05/21/2013. FINDINGS: CTA CHEST FINDINGS Cardiovascular: The heart is borderline enlarged and there is no pericardial effusion. Multi-vessel coronary artery calcifications are noted. There is atherosclerotic calcification of the aorta without evidence of aneurysm. Pulmonary trunk is normal in caliber. There are questionable pulmonary emboli in the left upper and lower lobes. Evaluation is limited due to mixing artifact and respiratory motion. The right ventricle is distended, not significantly changed from 2015, however possibility right heart strain can not be excluded. Mediastinum/Nodes: No enlarged mediastinal, hilar, or axillary lymph nodes. Thyroid gland, trachea, and esophagus demonstrate no significant findings. There is a small hiatal hernia. Lungs/Pleura: Mild atelectasis is noted bilaterally. No effusion or pneumothorax is seen. Musculoskeletal: Degenerative changes are present in the thoracic spine. No acute osseous abnormality is seen. Review of the MIP images confirms the above findings. CT ABDOMEN and PELVIS FINDINGS Hepatobiliary: No focal liver abnormality is seen. No gallstones, gallbladder wall thickening, or biliary dilatation. Pancreas: Pancreatic atrophy is noted. No pancreatic ductal dilatation or surrounding inflammatory changes. Spleen: Normal in size without focal abnormality. Adrenals/Urinary Tract: The adrenal glands are within normal limits. The kidneys enhance symmetrically. Subcentimeter hypodensities are present in the kidneys bilaterally which are too small to further characterize. No renal calculus or obstructive uropathy. Bladder is unremarkable. Stomach/Bowel: There is a small hiatal hernia. The stomach is otherwise within normal limits. Segmental thickening of the walls of the terminal  ileum is noted with multiple dilated loops of small bowel proximal to this segment abdomen measuring up to 4.1 cm. The appendix is not seen and surgical changes are noted at the cecum. No free air or pneumatosis is seen. Vascular/Lymphatic: Aortic atherosclerosis. No enlarged abdominal or pelvic lymph nodes. Reproductive: Prostate gland is mildly enlarged. Other: A small amount of free fluid is noted in the pelvis. Musculoskeletal: Degenerative changes are present in the lumbar spine. No acute osseous abnormality is seen. Review of the MIP images confirms the above findings. IMPRESSION: 1. Questionable left upper lobe and left lower lobe pulmonary emboli. Examination is limited due to mixing artifact and respiratory motion. The right ventricle is mildly distended, which is present on a previous exam, however the possibility of right heart strain can not be excluded. Consider repeat evaluation or V/Q scan for confirmation. 2. Segmental thickening of the walls of the terminal ileum, possible infectious or inflammatory ileitis with dilatation of the small bowel proximal to this segment, resulting in partial or early small bowel obstruction. 3. Small hiatal hernia. 4. Coronary artery calcifications. 5. Aortic atherosclerosis. Electronically Signed   By: Thornell Sartorius M.D.   On: 01/04/2023 04:25   CT Head Wo Contrast  Result Date: 01/04/2023 CLINICAL DATA:  Altered mental status EXAM: CT HEAD WITHOUT CONTRAST TECHNIQUE: Contiguous axial images were obtained from the base  of the skull through the vertex without intravenous contrast. RADIATION DOSE REDUCTION: This exam was performed according to the departmental dose-optimization program which includes automated exposure control, adjustment of the mA and/or kV according to patient size and/or use of iterative reconstruction technique. COMPARISON:  None Available. 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 No evidence of acute intracranial hemorrhage or infarct. No abnormal mass effect or midline shift. No abnormal intra or extra-axial mass lesion. Ventricular size is normal. Vascular: No hyperdense vessel or unexpected calcification. Skull: No acute fracture Sinuses/Orbits: Paranasal sinuses are clear. Right ocular lens has been removed. Orbits are otherwise unremarkable. Other: Mastoid air cells and middle ear cavities are clear. IMPRESSION: 1. No acute intracranial abnormality. 2. 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 Electronically Signed   By: Helyn Numbers M.D.   On: 01/04/2023 01:44   DG Chest Port 1 View  Result Date: 01/04/2023 CLINICAL DATA:  Syncope EXAM: PORTABLE CHEST 1 VIEW COMPARISON:  05/22/2013 FINDINGS: Lungs are clear.  No pleural effusion or pneumothorax. The heart is normal in size.  Thoracic aortic atherosclerosis. IMPRESSION: No acute cardiopulmonary disease. Electronically Signed   By: Charline Bills M.D.   On: 01/04/2023 01:25     Subjective: Patient seen examined bedside, resting calmly.  Lying in bed.  Spouse present.  Heparin drip discontinued this morning as INR now therapeutic at 2.9.  Completed 7-day course of IV ceftriaxone.  Has 1 remaining dose of metronidazole due this evening.  Has been accepted to CIR with insurance authorization and discharging to inpatient rehab today.  No other specific questions or concerns at this time. Denies headache, no dizziness, no chest pain, no palpitations, no shortness of breath, no current abdominal pain, no current fever, no chills/night sweats, no nausea/vomiting/diarrhea, no focal weakness, no cough/congestion, no paresthesias.  No acute events overnight per nursing staff.   Discharge Exam: Vitals:   01/09/23 0632 01/09/23 0730  BP: (!) 151/78 (!) 171/93  Pulse: 83 87  Resp: 18 16  Temp: 98.3 F (36.8 C) 98.2  F (36.8 C)  SpO2: 97% 97%   Vitals:   01/08/23 2300 01/09/23 0038 01/09/23 0632 01/09/23 0730  BP: (!) 185/68 (!) 157/76 (!) 151/78 (!) 171/93  Pulse: 70 73 83 87  Resp: 17  18 16   Temp: 97.8 F (36.6 C) 98.4 F (36.9 C) 98.3 F (36.8 C) 98.2 F (36.8 C)  TempSrc: Oral Oral Oral Oral  SpO2: 96% 98% 97% 97%  Weight:      Height:        Physical Exam: GEN: NAD, alert and oriented x 3, elderly, ill in appearance HEENT: NCAT, PERRL, EOMI, sclera clear, MMM PULM: CTAB w/o wheezes/crackles, normal respiratory effort, on room air CV: RRR w/o M/G/R GI: abd soft, NTND, NABS, no R/G/M MSK: no peripheral edema, muscle strength globally intact 5/5 bilateral upper/lower extremities NEURO: CN II-XII intact, no focal deficits, sensation to light touch intact PSYCH: normal mood/affect Integumentary: No concerning rashes/lesions/wounds noted on exposed skin surfaces    The results of significant diagnostics from this hospitalization (including imaging, microbiology, ancillary and laboratory) are listed below for reference.     Microbiology: Recent Results (from the past 240 hour(s))  Blood Culture (routine x 2)     Status: None   Collection Time: 01/04/23  1:01 AM   Specimen: BLOOD  Result Value Ref Range Status   Specimen  Description BLOOD SITE NOT SPECIFIED  Final   Special Requests   Final    BOTTLES DRAWN AEROBIC AND ANAEROBIC Blood Culture results may not be optimal due to an excessive volume of blood received in culture bottles   Culture   Final    NO GROWTH 5 DAYS Performed at [facility] Lab, [address] [place], [place] [address]    Report Status 01/09/2023 FINAL  Final  Blood Culture (routine x 2)     Status: None   Collection Time: 01/04/23  2:17 AM   Specimen: BLOOD  Result Value Ref Range Status   Specimen Description BLOOD BLOOD LEFT ARM  Final   Special Requests   Final    BOTTLES DRAWN AEROBIC AND ANAEROBIC Blood Culture adequate volume   Culture    Final    NO GROWTH 5 DAYS Performed at [facility], [address], [place], [place] [address]    Report Status 01/09/2023 FINAL  Final  SARS Coronavirus 2 by RT PCR (hospital order, performed in [facility] hospital lab) *cepheid single result test* Anterior Nasal Swab     Status: None   Collection Time: 01/04/23  3:23 AM   Specimen: Anterior Nasal Swab  Result Value Ref Range Status   SARS Coronavirus 2 by RT PCR NEGATIVE NEGATIVE Final    Comment: Performed at [facility], [address], [place], [place] [address]  CSF culture w Gram Stain     Status: None   Collection Time: 01/04/23  2:15 PM   Specimen: CSF; Cerebrospinal Fluid  Result Value Ref Range Status   Specimen Description CSF  Final   Special Requests NONE  Final   Gram Stain NO WBC SEEN NO ORGANISMS SEEN CYTOSPIN SMEAR   Final   Culture   Final    NO GROWTH 3 DAYS Performed at [facility], [address] [place], [place] [address]    Report Status 01/08/2023 FINAL  Final  Culture, Fungus without Smear     Status: None (Preliminary result)   Collection Time: 01/04/23  2:15 PM   Specimen: PATH Cytology CSF; Cerebrospinal Fluid  Result Value Ref Range Status   Specimen Description CSF  Final   Special Requests NONE  Final   Culture   Final    NO FUNGUS ISOLATED AFTER 5 DAYS Performed at [facility], [address] [place], [place] [address]    Report Status PENDING  Incomplete  MRSA Next Gen by PCR, Nasal     Status: None   Collection Time: 01/04/23  5:48 PM   Specimen: Nasal Mucosa; Nasal Swab  Result Value Ref Range Status   MRSA by PCR Next Gen NOT DETECTED NOT DETECTED Final    Comment: (NOTE) The GeneXpert MRSA Assay (FDA approved for NASAL specimens only), is one component of a comprehensive MRSA colonization surveillance program. It is not intended to diagnose MRSA infection nor to guide or monitor treatment for MRSA infections. Test performance is not FDA approved in  patients less than 76 years old. Performed at [facility], [address], [place], [place] [address]      Labs: BNP (last 3 results) No results for input(s): "BNP" in the last 8760 hours. Basic Metabolic Panel: Recent Labs  Lab 01/04/23 0049 01/05/23 0355 01/06/23 0410 01/07/23 0503 01/08/23 0819  NA 132* 133* 135 138 138  K 3.9 3.6 4.2 3.6 3.6  CL 103 110 115* 112* 110  CO2 19* 15* 17* 18* 17*  GLUCOSE 182* 220* 212* 180* 133*  BUN 21 26* 23 14 10   CREATININE 1.34* 1.69* 1.47* 1.02 0.87  CALCIUM 8.0* 6.7* 7.3* 8.3* 8.3*  MG  --  1.5* 2.4 2.1 1.8  PHOS  --  3.8  --   --   --    Liver Function Tests: Recent Labs  Lab 01/04/23 0049  AST 40  ALT 29  ALKPHOS 65  BILITOT 0.7  PROT 5.5*  ALBUMIN 3.1*   No results for input(s): "LIPASE", "AMYLASE" in the last 168 hours. No results for input(s): "AMMONIA" in the last 168 hours. CBC: Recent Labs  Lab 01/04/23 0049 01/05/23 0355 01/06/23 0410 01/07/23 0503 01/08/23 0819 01/09/23 0633  WBC 8.0 19.8* 10.2 7.2 7.4 7.6  NEUTROABS 7.6  --   --   --   --   --   HGB 11.7* 9.8* 9.1* 8.6* 10.1* 9.5*  HCT 35.1* 31.0* 28.7* 26.4* 30.4* 28.0*  MCV 93.6 98.1 98.6 94.0 93.8 92.4  PLT 271 207 188 193 209 232   Cardiac Enzymes: No results for input(s): "CKTOTAL", "CKMB", "CKMBINDEX", "TROPONINI" in the last 168 hours. BNP: Invalid input(s): "POCBNP" CBG: Recent Labs  Lab 01/08/23 1143 01/08/23 1711 01/08/23 2137 01/09/23 0729 01/09/23 1129  GLUCAP 137* 129* 102* 106* 146*   D-Dimer No results for input(s): "DDIMER" in the last 72 hours. Hgb A1c No results for input(s): "HGBA1C" in the last 72 hours. Lipid Profile No results for input(s): "CHOL", "HDL", "LDLCALC", "TRIG", "CHOLHDL", "LDLDIRECT" in the last 72 hours. Thyroid function studies No results for input(s): "TSH", "T4TOTAL", "T3FREE", "THYROIDAB" in the last 72 hours.  Invalid input(s): "FREET3" Anemia work up No results for input(s):  "VITAMINB12", "FOLATE", "FERRITIN", "TIBC", "IRON", "RETICCTPCT" in the last 72 hours. Urinalysis    Component Value Date/Time   COLORURINE YELLOW 01/04/2023 0049   APPEARANCEUR HAZY (A) 01/04/2023 0049   APPEARANCEUR Clear 05/21/2013 2121   LABSPEC 1.018 01/04/2023 0049   LABSPEC 1.031 05/21/2013 2121   PHURINE 5.0 01/04/2023 0049   GLUCOSEU NEGATIVE 01/04/2023 0049   GLUCOSEU 50 mg/dL 91/47/8295 6213   HGBUR NEGATIVE 01/04/2023 0049   BILIRUBINUR NEGATIVE 01/04/2023 0049   BILIRUBINUR Negative 05/21/2013 2121   KETONESUR NEGATIVE 01/04/2023 0049   PROTEINUR NEGATIVE 01/04/2023 0049   NITRITE NEGATIVE 01/04/2023 0049   LEUKOCYTESUR NEGATIVE 01/04/2023 0049   LEUKOCYTESUR Negative 05/21/2013 2121   Sepsis Labs Recent Labs  Lab 01/06/23 0410 01/07/23 0503 01/08/23 0819 01/09/23 0633  WBC 10.2 7.2 7.4 7.6   Microbiology Recent Results (from the past 240 hour(s))  Blood Culture (routine x 2)     Status: None   Collection Time: 01/04/23  1:01 AM   Specimen: BLOOD  Result Value Ref Range Status   Specimen Description BLOOD SITE NOT SPECIFIED  Final   Special Requests   Final    BOTTLES DRAWN AEROBIC AND ANAEROBIC Blood Culture results may not be optimal due to an excessive volume of blood received in culture bottles   Culture   Final    NO GROWTH 5 DAYS Performed at [facility], [address], [place], [place] [address]    Report Status 01/09/2023 FINAL  Final  Blood Culture (routine x 2)     Status: None   Collection Time: 01/04/23  2:17 AM   Specimen: BLOOD  Result Value Ref Range Status   Specimen Description BLOOD BLOOD LEFT ARM  Final   Special Requests  Final    BOTTLES DRAWN AEROBIC AND ANAEROBIC Blood Culture adequate volume   Culture   Final    NO GROWTH 5 DAYS Performed at [facility], [address] [place], [place] [address]    Report Status 01/09/2023 FINAL  Final  SARS Coronavirus 2 by RT PCR (hospital order, performed in [facility]  hospital lab) *cepheid single result test* Anterior Nasal Swab     Status: None   Collection Time: 01/04/23  3:23 AM   Specimen: Anterior Nasal Swab  Result Value Ref Range Status   SARS Coronavirus 2 by RT PCR NEGATIVE NEGATIVE Final    Comment: Performed at [facility], [address], [place], [place] [address]  CSF culture w Gram Stain     Status: None   Collection Time: 01/04/23  2:15 PM   Specimen: CSF; Cerebrospinal Fluid  Result Value Ref Range Status   Specimen Description CSF  Final   Special Requests NONE  Final   Gram Stain NO WBC SEEN NO ORGANISMS SEEN CYTOSPIN SMEAR   Final   Culture   Final    NO GROWTH 3 DAYS Performed at [facility], [address], [place], [place] [address]    Report Status 01/08/2023 FINAL  Final  Culture, Fungus without Smear     Status: None (Preliminary result)   Collection Time: 01/04/23  2:15 PM   Specimen: PATH Cytology CSF; Cerebrospinal Fluid  Result Value Ref Range Status   Specimen Description CSF  Final   Special Requests NONE  Final   Culture   Final    NO FUNGUS ISOLATED AFTER 5 DAYS Performed at [facility], [address], [place], [place] [address]    Report Status PENDING  Incomplete  MRSA Next Gen by PCR, Nasal     Status: None   Collection Time: 01/04/23  5:48 PM   Specimen: Nasal Mucosa; Nasal Swab  Result Value Ref Range Status   MRSA by PCR Next Gen NOT DETECTED NOT DETECTED Final    Comment: (NOTE) The GeneXpert MRSA Assay (FDA approved for NASAL specimens only), is one component of a comprehensive MRSA colonization surveillance program. It is not intended to diagnose MRSA infection nor to guide or monitor treatment for MRSA infections. Test performance is not FDA approved in patients less than 55 years old. Performed at [facility], [address] [place], [place] [address]      Time coordinating discharge: Over 30 minutes  SIGNED:   Alvira Philips [place], DO  [facility] 01/09/2023, 3:11 PM

## 2023-01-09 NOTE — Progress Notes (Signed)
Inpatient Rehab Admissions Coordinator:    I have a CIR bed and can admit Pt. Today. RN may call report to [phone].   Pt. To admit to CIR for an estimated 7-10 days with the goal of reaching supervision level and returning home with his wife.   Megan Salon, MS, CCC-SLP Rehab Admissions Coordinator  [phone] (celll) [phone] (office)

## 2023-01-09 NOTE — Progress Notes (Signed)
Physical Therapy Treatment Patient Details Name: Larry Lane MRN: [id number] DOB: 09-05-1940 Today's Date: 01/09/2023   History of Present Illness 82 y/o gentleman who presented 11/6 with episodes of confusion at home and rigors; Septic shock concern for meningitis, Acute metabolic encephalopathy, Troponin elevation concern for PE; submassive PE+.  With a history of trigeminal neuralgia, HTN    PT Comments  Pt received in supine and agreeable to session. Pt requires increased cues for technique during all mobility tasks, but is able to complete with up to min A. Pt demonstrates impaired L quad activation and strength during ambulation causing knee buckling, but pt able to support with BUE to prevent LOB. Pt able to tolerate increased distance, however distance is limited by fatigue. Pt and pt's wife educated on LLE exercises to strengthen and increase quad activation with pt able to demonstrate. Pt demonstrates impaired muscle coordination during quad sets. Pt continues to benefit from PT services to progress toward functional mobility goals.    If plan is discharge home, recommend the following: A lot of help with walking and/or transfers;Assistance with cooking/housework;Direct supervision/assist for medications management;Direct supervision/assist for financial management;Assist for transportation;Help with stairs or ramp for entrance;A lot of help with bathing/dressing/bathroom;Supervision due to cognitive status   Can travel by private vehicle     No  Equipment Recommendations  None recommended by PT (TBD next venue)    Recommendations for Other Services       Precautions / Restrictions Precautions Precautions: Fall Restrictions Weight Bearing Restrictions: No     Mobility  Bed Mobility Overal bed mobility: Needs Assistance Bed Mobility: Supine to Sit     Supine to sit: Used rails, Min assist, HOB elevated     General bed mobility comments: Min A for LLE elevation, but  pt able to advance to EOB. Cues for technique and use of rail and pt able to elevate trunk with increased time/effort, but no physical assist    Transfers Overall transfer level: Needs assistance Equipment used: Rolling walker (2 wheels) Transfers: Sit to/from Stand Sit to Stand: Contact guard assist           General transfer comment: cues for safe hand placement and increased time to reach full upright posture    Ambulation/Gait Ambulation/Gait assistance: Contact guard assist, Min assist Gait Distance (Feet): 20 Feet Assistive device: Rolling walker (2 wheels) Gait Pattern/deviations: Step-through pattern, Decreased stride length, Trunk flexed Gait velocity: dec   Pre-gait activities: weight shifting General Gait Details: Pt demonstrating slow steps with cues for sequencing and RW management. L knee buckling in stance requiring intermittent min A for blocking, but pt able to support with BUE to prevent LOB. Cues for pacing at end of gait trial due to pt walking quicker with increased instability      Balance Overall balance assessment: Needs assistance Sitting-balance support: No upper extremity supported, Feet supported Sitting balance-Leahy Scale: Fair Sitting balance - Comments: sitting EOB   Standing balance support: Bilateral upper extremity supported, During functional activity, Reliant on assistive device for balance Standing balance-Leahy Scale: Poor Standing balance comment: with RW support                            Cognition Arousal: Alert Behavior During Therapy: WFL for tasks assessed/performed Overall Cognitive Status: Impaired/Different from baseline  General Comments: Increased cues during session and wife reporting that pt is [facility]        Exercises General Exercises - Lower Extremity Quad Sets: AROM, AAROM, Seated, Left, 10 reps Heel Slides: AROM, Seated, Left, 5 reps    General Comments  General comments (skin integrity, edema, etc.): Pt's wife present and supportive throughout session.      Pertinent Vitals/Pain Pain Assessment Pain Assessment: 0-10 Pain Score: 7  Pain Location: L knee Pain Descriptors / Indicators: Sore Pain Intervention(s): Monitored during session, Limited activity within patient's tolerance, Repositioned     PT Goals (current goals can now be found in the care plan section) Acute Rehab PT Goals Patient Stated Goal: Get well PT Goal Formulation: With patient Time For Goal Achievement: 01/19/23 Progress towards PT goals: Progressing toward goals    Frequency    Min 1X/week       AM-PAC PT "6 Clicks" Mobility   Outcome Measure  Help needed turning from your back to your side while in a flat bed without using bedrails?: A Little Help needed moving from lying on your back to sitting on the side of a flat bed without using bedrails?: A Little Help needed moving to and from a bed to a chair (including a wheelchair)?: A Little Help needed standing up from a chair using your arms (e.g., wheelchair or bedside chair)?: A Little Help needed to walk in hospital room?: A Lot Help needed climbing 3-5 steps with a railing? : Total 6 Click Score: 15    End of Session Equipment Utilized During Treatment: Gait belt Activity Tolerance: Patient tolerated treatment well;Patient limited by fatigue Patient left: in chair;with family/visitor present;with call bell/phone within reach Nurse Communication: Mobility status PT Visit Diagnosis: Unsteadiness on feet (R26.81);Other abnormalities of gait and mobility (R26.89);Muscle weakness (generalized) (M62.81);Difficulty in walking, not elsewhere classified (R26.2);Pain     Time: 3220-2542 PT Time Calculation (min) (ACUTE ONLY): 26 min  Charges:    $Gait Training: 8-22 mins $Therapeutic Exercise: 8-22 mins PT General Charges $$ ACUTE PT VISIT: 1 Visit                     Johny Shock, PTA Acute  Rehabilitation Services Secure Chat Preferred  [phone]    Johny Shock 01/09/2023, 10:37 AM

## 2023-01-09 NOTE — PMR Pre-admission (Signed)
PMR Admission Coordinator Pre-Admission Assessment  Patient: Larry Lane is an 82 y.o., male MRN: [id number] DOB: 1940/08/25 Height: 5\' 6"  (167.6 cm) Weight: 84.9 kg  Insurance Information HMO: yes    PPO:      PCP:      IPA:      80/20:      OTHER:  PRIMARY: Blue Medicare       Policy#: [id number]       Subscriber: Pt CM Name: Carollee Herter      Phone#: [phone]     Fax#:  [phone] Pt. Approved by Carollee Herter 11/11 for 11/ to 42/  Pre-Cert#: [id number]      Employer:  Benefits:  Phone #: [phone]     Name:  Dolores Hoose Date: 02/28/2022 - 02/27/2198 Deductible: does not have one OOP Max: $3,500 ($110 met) CIR: $335/day co-pay for days 1-5, $0 copay for days 6-90 SNF: $0.00 Copayment per day for days 1-20; $203 Copayment per day for days 21-60; $0.00 copayment for days 61-100 Maximum of 100 days/benefit period Outpatient:  $10 copay/visit Home Health:  100% coverage DME: 80% coverage, 20% co-insurance Providers: in network   SECONDARY:       Policy#:      Phone#:   Artist:       Phone#:   The Data processing manager" for patients in Inpatient Rehabilitation Facilities with attached "Privacy Act Statement-Health Care Records" was provided and verbally reviewed with: Patient  Emergency Contact Information Contact Information     Name Relation Home Work Mobile   [place] A Spouse [phone]        Other Contacts   None on File     Current Medical History  Patient Admitting Diagnosis: Debility, Sepsis, PE History of Present Illness: Larry Lane is a 82 y.o. male with a history of hypertension, diabetes, trigeminal neuralgia who presented on 01/04/2023 with abdominal pain as well as confusion.  Temperature upon admission was 103.  CT of the head was negative for acute changes although it it did demonstrate encephalomalacia within the right cerebellar hemisphere near the site of the right suboccipital craniotomy.  CT angiogram of the chest was  notable for left upper lobe and left lower lobe pulmonary emboli and the patient was placed on a heparin drip.  CT of the abdomen and pelvis revealed segmental thickening of the walls of the terminal ileum consistent with a possible infectious versus inflammatory ileitis.  Patient was started on empiric antibiotics.  The lumbar puncture was performed due to his confusion which was unrevealing.  He is currently on IV ceftriaxone and p.o. metronidazole for coverage.  Blood cultures and CSF cultures have been negative.  Troponin was elevated initially with etiology likely secondary to type II demand ischemia in the setting of septic shock.  Renal function is improving and cognition is near baseline.  Patient was last up with physical therapy on 01/06/2023 and was mod assist for sit to stand transfers.  Gait was deferred due to confusion and left knee buckling.  He did work with OT yesterday and was min assist for sit to stand transfers but still demonstrated left knee buckling.  X-rays of the left knee incidentally have been unremarkable.  Patient lives in a 1 level house with 6 steps to enter.  He is modified independent with a rolling walker prior to admission although did have some falls over the last several months. Pt. Was seen by PT/OT and they recommended CIR to assist return to PLOF.  Complete NIHSS TOTAL: 3  Patient's medical record from [facility] has been reviewed by the rehabilitation admission coordinator and physician.  Past Medical History  Past Medical History:  Diagnosis Date   Abscess of back    Arthritis    Bacterial infection 2015   Cancer (HCC)    skin    Diabetes mellitus without complication (HCC)    Dysrhythmia    Edema    RIGHT ANKLE   GERD (gastroesophageal reflux disease)    Hypertension    Trigeminal nerve disorder    Trigeminal neuralgia     Has the patient had major surgery during 100 days prior to admission? Yes  Family History   family history  includes Heart disease in his father.  Current Medications  Current Facility-Administered Medications:    acetaminophen (TYLENOL) tablet 1,000 mg, 1,000 mg, Oral, Q8H, Rocky Morel, DO, 1,000 mg at 01/08/23 2139   albuterol (PROVENTIL) (2.5 MG/3ML) 0.083% nebulizer solution 2.5 mg, 2.5 mg, Nebulization, Q6H PRN, Rocky Morel, DO   amLODipine (NORVASC) tablet 5 mg, 5 mg, Oral, Daily, Rocky Morel, DO, 5 mg at 01/09/23 0846   carbamazepine (TEGRETOL XR) 12 hr tablet 200 mg, 200 mg, Oral, QID, Rocky Morel, DO, 200 mg at 01/09/23 1610   Chlorhexidine Gluconate Cloth 2 % PADS 6 each, 6 each, Topical, Daily, Rocky Morel, DO, 6 each at 01/08/23 0929   docusate sodium (COLACE) capsule 100 mg, 100 mg, Oral, BID PRN, Rocky Morel, DO   gabapentin (NEURONTIN) capsule 600 mg, 600 mg, Oral, TID, Rocky Morel, DO, 600 mg at 01/09/23 0847   hydrALAZINE (APRESOLINE) injection 10 mg, 10 mg, Intravenous, Q6H PRN, Rocky Morel, DO, 10 mg at 01/07/23 0636   insulin aspart (novoLOG) injection 0-5 Units, 0-5 Units, Subcutaneous, QHS, Rocky Morel, DO, 2 Units at 01/06/23 2248   insulin aspart (novoLOG) injection 0-9 Units, 0-9 Units, Subcutaneous, TID WC, Rocky Morel, DO, 4 Units at 01/09/23 1230   insulin aspart (novoLOG) injection 3 Units, 3 Units, Subcutaneous, TID WC, Rocky Morel, DO, 3 Units at 01/09/23 1231   insulin glargine-yfgn (SEMGLEE) injection 5 Units, 5 Units, Subcutaneous, Daily, Rocky Morel, DO, 5 Units at 01/09/23 0857   metroNIDAZOLE (FLAGYL) tablet 500 mg, 500 mg, Oral, Q12H, Rocky Morel, DO, 500 mg at 01/09/23 0847   ondansetron (ZOFRAN) tablet 4 mg, 4 mg, Oral, Q6H PRN **OR** ondansetron (ZOFRAN) injection 4 mg, 4 mg, Intravenous, Q6H PRN, Rocky Morel, DO   Oral care mouth rinse, 15 mL, Mouth Rinse, 4 times per day, Rocky Morel, DO, 15 mL at 01/09/23 1231   Oral care mouth rinse, 15 mL, Mouth Rinse, PRN, Rocky Morel, DO   oxyCODONE (Oxy  IR/ROXICODONE) immediate release tablet 5 mg, 5 mg, Oral, Q4H PRN, Rocky Morel, DO, 5 mg at 01/05/23 2340   pantoprazole (PROTONIX) EC tablet 40 mg, 40 mg, Oral, Daily, Rocky Morel, DO, 40 mg at 01/09/23 0846   polyethylene glycol (MIRALAX / GLYCOLAX) packet 17 g, 17 g, Oral, Daily PRN, Rocky Morel, DO   rosuvastatin (CRESTOR) tablet 10 mg, 10 mg, Oral, Daily, Rocky Morel, DO, 10 mg at 01/09/23 0846   sodium chloride flush (NS) 0.9 % injection 10-40 mL, 10-40 mL, Intracatheter, Q12H, Rocky Morel, DO, 10 mL at 01/09/23 0856   sodium chloride flush (NS) 0.9 % injection 10-40 mL, 10-40 mL, Intracatheter, PRN, Rocky Morel, DO   traZODone (DESYREL) tablet 100 mg, 100 mg, Oral, QHS PRN, Rocky Morel, DO, 100 mg at 01/06/23 0117  Warfarin - Pharmacist Dosing Inpatient, , Does not apply, q1600, Arma [facility], Given at 01/08/23 1631  Patients Current Diet:  Diet Order             Diet regular Fluid consistency: Thin  Diet effective now                   Precautions / Restrictions Precautions Precautions: Fall Restrictions Weight Bearing Restrictions: No   Has the patient had 2 or more falls or a fall with injury in the past year? No  Prior Activity Level Community (5-7x/wk): Pt active in the community PTA  Prior Functional Level Self Care: Did the patient need help bathing, dressing, using the toilet or eating? Independent  Indoor Mobility: Did the patient need assistance with walking from room to room (with or without device)? Independent  Stairs: Did the patient need assistance with internal or external stairs (with or without device)? Independent  Functional Cognition: Did the patient need help planning regular tasks such as shopping or remembering to take medications? Independent  Patient Information Are you of Hispanic, Latino/a,or Spanish origin?: A. No, not of Hispanic, Latino/a, or Spanish origin What is your race?: A. White Do you  need or want an interpreter to communicate with a doctor or health care staff?: 0. No  Patient's Response To:  Health Literacy and Transportation Is the patient able to respond to health literacy and transportation needs?: Yes Health Literacy - How often do you need to have someone help you when you read instructions, pamphlets, or other written material from your doctor or pharmacy?: Never In the past 12 months, has lack of transportation kept you from medical appointments or from getting medications?: No In the past 12 months, has lack of transportation kept you from meetings, work, or from getting things needed for daily living?: No  Home Assistive Devices / Equipment Home Equipment: Agricultural consultant (2 wheels) (wife may have given away)  Prior Device Use: Indicate devices/aids used by the patient prior to current illness, exacerbation or injury? None of the above  Current Functional Level Cognition  Arousal/Alertness: Awake/alert Overall Cognitive Status: Impaired/Different from baseline Orientation Level: Oriented X4 Following Commands: Follows one step commands consistently, Follows one step commands with increased time Safety/Judgement: Decreased awareness of safety, Decreased awareness of deficits General Comments: Increased cues during session and wife reporting that pt is [facility] Attention: Focused, Selective Focused Attention: Impaired Focused Attention Impairment: Verbal complex, Functional complex Selective Attention: Impaired Selective Attention Impairment: Verbal complex, Functional complex Memory: Impaired Memory Impairment: Decreased short term memory, Retrieval deficit Decreased Short Term Memory: Verbal basic, Functional basic Awareness: Appears intact    Extremity Assessment (includes Sensation/Coordination)  Upper Extremity Assessment: Right hand dominant (overall B UE strength 4- to 4/5) RUE Deficits / Details: edematous LUE Deficits / Details: edematous  Lower  Extremity Assessment: Defer to PT evaluation LLE Deficits / Details: Ankle DF, PF, eversion, Knee flexion, hip extension, hip abduction grossly 5/5. However inconsistent with Lt knee extension and hip flexion ranging from 2/5 to 4/5. Seems to be pain limited at times. Unsure if guarding or tone with hip ROM, some pain with FABER and hip thrust. LLE: Unable to fully assess due to pain    ADLs  Overall ADL's : Needs assistance/impaired Eating/Feeding: Set up, Sitting Grooming: Supervision/safety, Sitting Upper Body Bathing: Minimal assistance, Sitting Lower Body Bathing: Maximal assistance, Sit to/from stand Upper Body Dressing : Minimal assistance, Sitting Lower Body Dressing: Maximal assistance, Sit to/from stand  Toilet Transfer: Minimal assistance, Ambulation, BSC/3in1, Rolling walker (2 wheels), Cueing for safety, Cueing for sequencing (step-pivot transfer) Toileting- Clothing Manipulation and Hygiene: Maximal assistance, Sit to/from stand Functional mobility during ADLs: Minimal assistance, Rolling walker (2 wheels) General ADL Comments: Pt presents with decreased activity tolerance during functional tasks.    Mobility  Overal bed mobility: Needs Assistance Bed Mobility: Supine to Sit Supine to sit: Used rails, Min assist, HOB elevated Sit to supine: Min assist General bed mobility comments: Min A for LLE elevation, but pt able to advance to EOB. Cues for technique and use of rail and pt able to elevate trunk with increased time/effort, but no physical assist    Transfers  Overall transfer level: Needs assistance Equipment used: Rolling walker (2 wheels) Transfers: Sit to/from Stand Sit to Stand: Contact guard assist Bed to/from chair/wheelchair/BSC transfer type:: Step pivot Step pivot transfers: Min assist, From elevated surface (with use of RW) General transfer comment: cues for safe hand placement and increased time to reach full upright posture    Ambulation / Gait / Stairs /  Wheelchair Mobility  Ambulation/Gait Ambulation/Gait assistance: Contact guard assist, Min assist Gait Distance (Feet): 20 Feet Assistive device: Rolling walker (2 wheels) Gait Pattern/deviations: Step-through pattern, Decreased stride length, Trunk flexed General Gait Details: Pt demonstrating slow steps with cues for sequencing and RW management. L knee buckling in stance requiring intermittent min A for blocking, but pt able to support with BUE to prevent LOB. Cues for pacing at end of gait trial due to pt walking quicker with increased instability Gait velocity: dec Gait velocity interpretation: <1.31 ft/sec, indicative of household ambulator Pre-gait activities: weight shifting    Posture / Balance Dynamic Sitting Balance Sitting balance - Comments: sitting EOB Balance Overall balance assessment: Needs assistance Sitting-balance support: No upper extremity supported, Feet supported Sitting balance-Leahy Scale: Fair Sitting balance - Comments: sitting EOB Standing balance support: Bilateral upper extremity supported, During functional activity, Reliant on assistive device for balance Standing balance-Leahy Scale: Poor Standing balance comment: with RW support    Special needs/care consideration Skin intact and Special service needs IV rocephin, heparin drip   Previous Home Environment (from acute therapy documentation) Living Arrangements: Spouse/significant other Available Help at Discharge: Family, Available 24 hours/day Type of Home: House Home Layout: One level, Other (Comment) (1 step down to living room) Home Access: Stairs to enter Entrance Stairs-Rails: Right, Left Entrance Stairs-Number of Steps: 6 Bathroom Shower/Tub: Health visitor: Pharmacist, community: Yes Home Care Services: No  Discharge Living Setting Plans for Discharge Living Setting: Patient's home Type of Home at Discharge: House Discharge Home Layout: One level Discharge  Home Access: Stairs to enter Entrance Stairs-Rails: Right, Left Entrance Stairs-Number of Steps: 6 Discharge Bathroom Shower/Tub: Walk-in shower Discharge Bathroom Toilet: Standard Discharge Bathroom Accessibility: Yes How Accessible: Accessible via walker, Accessible via wheelchair Does the patient have any problems obtaining your medications?: No  Social/Family/Support Systems Patient Roles: Spouse Contact Information: [phone] Anticipated Caregiver: Vonna Kotyk Anticipated Caregiver's Contact Information: 24/7 min A Caregiver Availability: 24/7 Discharge Plan Discussed with Primary Caregiver: Yes Is Caregiver In Agreement with Plan?: No Does Caregiver/Family have Issues with Lodging/Transportation while Pt is in Rehab?: Yes  Goals Patient/Family Goal for Rehab: PT/OT/SLP Supervision level Expected length of stay: 7-10 days Pt/Family Agrees to Admission and willing to participate: Yes Program Orientation Provided & Reviewed with Pt/Caregiver Including Roles  & Responsibilities: Yes  Decrease burden of Care through IP rehab admission: not anticipated  Possible need for  SNF placement upon discharge: not anticiapted  Patient Condition: This patient's medical and functional status has changed since the consult dated: 01/09/23 in which the Rehabilitation Physician determined and documented that the patient's condition is appropriate for intensive rehabilitative care in an inpatient rehabilitation facility After evaluating the patient today and speaking with the Rehabilitation physician and acute team, the patient remains appropriate for inpatient rehab. Will admit to inpatient rehab today.  Preadmission Screen Completed By:  Jeronimo Greaves, 01/09/2023 1:11 PM ______________________________________________________________________   Discussed status with Dr. Riley Kill on 01/09/23 at 930 and received approval for admission today.  Admission Coordinator:  Moises Blood, time  1501/Date 01/09/23   MD Signature: Ranelle Oyster, MD, [facility] Physical Medicine & Rehabilitation Medical Director Rehabilitation Services 01/09/2023

## 2023-01-09 NOTE — Progress Notes (Signed)
Inpatient Rehab Admissions Coordinator:    CIR following. Pt. In agreement for CIR. MD to consult today and then I will send case to insurance.   Megan Salon, MS, CCC-SLP Rehab Admissions Coordinator  [phone] (celll) [phone] (office)

## 2023-01-09 NOTE — Progress Notes (Signed)
PROGRESS NOTE    Larry Lane  ZHY:[id number] DOB: 1940-11-25 DOA: 01/04/2023 PCP: Jerl Mina, MD    Brief Narrative:   Larry Lane is a 82 y.o. male with past medical history significant for HTN, HLD, DM2, trigeminal neuralgia, GERD, osteoarthritis who presented to [facility] ED on 01/04/2023 with complaints of abdominal pain.  Spouse also noted patient seemed confused with rigors night prior to ED presentation.  Denied urinary symptoms, no nausea/vomiting/diarrhea.  Initially admitted to the hospitalist service but significant decompensation with low blood pressure, tachycardia, elevated fever prompted referral to pulmonary/critical care medicine and ultimately was admitted to the intensive care unit same day.  Initial workup in the ED with temperature 103.0 F, HR 118, BP 74/62, SpO2 95% on room air.  WBC 8.0, hemoglobin 11.7, platelet count 271.  Sodium 132, potassium 3.9, chloride 103, CO2 19, glucose 182, BUN 21, creatinine 1.34.  AST 40, ALT 29, total bilirubin 0.7.  High sensitivity troponin 47>77.  INR 1.0.  Urinalysis unrevealing.  Chest x-ray with no acute cardiopulmonary disease process.  CT head without contrast with no acute intracranial [name], noted right suboccipital craniotomy with focal encephalomalacia within right cerebellar hemisphere.  CT angiogram chest with questionable left upper lobe and left lower lobe pulmonary emboli, RV mildly distended consistent with right heart strain, coronary artery calcifications, aortic atherosclerosis.  CT abdomen/pelvis segmental thickening walls of the terminal ileum possible infectious versus inflammatory ileitis with dilation of the small bowel proximal to the segment, small hiatal hernia.  Blood cultures x 2 were obtained.  Patient was started on empiric antibiotics.  Initially admitted to the hospital service but given decompensation was transferred to the ICU same day due to septic shock with requirement of vasopressors.  Significant  Hospital events: 11/6: Initially admitted to hospitalist service, transferred to ICU for septic shock requiring vasopressors, underwent lumbar puncture with CSF analysis unrevealing. 11/7: Continues to require vasopressors, intermittent fevers 11/8: Vasopressors titrated off; left knee x-ray unrevealing 11/9: Transferred back to hospitalist service 11/11: Heparin drip discontinued as INR now 2.9   Assessment & Plan:   Septic shock 2/2 ileitis Patient presenting to ED with abdominal pain.  Noted to be febrile, tachycardic, hypotensive on admission.  Initially requiring vasopressor support and admitted to the intensive care unit.  Patient was started on empiric antibiotics due to suspected ileitis.  Underwent a lumbar puncture due to confusion/encephalopathy which was unrevealing.  Chest x-ray/CT angiogram chest negative for focal consolidation.  Left knee x-ray with no acute finding.  Urinalysis unrevealing.  MRSA PCR negative. -- Blood cultures x 2: No growth x 5 days -- Culture CSF: No growth x 3 days; meningitis/encephalitis panel negative -- Ceftriaxone 2 g IV every 24 hours x 7 days -- Metronidazole 500 mg PO every 12 hours x 7 days -- Supportive care  Elevated troponin secondary to type II demand ischemia High sensitive troponin elevated at 47>77> 3448> 2150.  Etiology likely secondary to type II demand ischemia in the setting of septic shock as above from underlying ileitis versus submassive pulmonary embolism.  Acute renal failure: resolved Patient presenting with a creatinine of 1.34, peaked at 1.69.  Etiology likely secondary to prerenal azotemia in the setting of dehydration and poor oral intake in the days preceding hospitalization versus ATN from septic shock/hypotension.  Supported with IV fluid hydration, vasopressors. -- Cr 1.34>>1.69>1.47>1.02>0.87 -- Holding home losartan -- Continue encourage increased oral intake -- Avoid nephrotoxins, renal dose all medications -- BMP  daily  Submassive pulmonary  embolism CT angiogram chest with left upper lobe/left lower lobe pulmonary emboli, RV mildly distended consistent with right heart strain.  Troponin elevated.  TTE with LVEF 70 either 75%, LV with no regional wall motion abnormalities, RV systolic function mildly reduced, hypokinetic segments with hyperdynamic apex consistent with McConnell sign, RV size moderately enlarged, mild TR, IVC normal in size. -- Continue heparin drip, pharmacy consulted for dosing/monitoring -- Unable to transition to NOAC/DOAC/enoxaparin due to interactions with carbamazepine -- Continue warfarin -- INR 1.6>2.9 today (goal 2-3); discontinue heparin drip today -- INR daily  Acute metabolic encephalopathy: Resolved Patient presenting to ED with confusion, notably secondary to septic shock with hypoperfusion as above.  CT head, CSF analysis unrevealing.  Mental status now back to his typical baseline. -- Supportive care  Type 2 diabetes mellitus with hyperglycemia Hemoglobin A1c 7.6, not optimally controlled.  Home regimen includes Humalog 20 units once daily -- Semglee 5 units  daily -- NovoLog 3 units TIDAC -- SSI for coverage -- CBGs qAC/HS  Essential hypertension Home regimen includes amlodipine 5 mg p.o. daily, losartan 50 mg p.o. daily. -- Amlodipine 5 mg p.o. daily -- Continue to hold home losartan for now  Hyperlipidemia -- Crestor 10 mg p.o. daily  Trigeminal neuralgia -- Carbamazepine 200 mg p.o. 3 times daily -- Gabapentin 600 mg p.o. 3 times daily  GERD -- Protonix 40 mg p.o. daily (substituted for home omeprazole)  Weakness/debility/deconditioning/gait disturbance Seen by PT/OT with recommendation of CIR admission on discharge -- CIR following   DVT prophylaxis: SCDs Start: 01/04/23 1657    Code Status: Full Code Family Communication:   Disposition Plan:  Level of care: Telemetry Medical Status is: Inpatient Remains inpatient appropriate because:  Pending CIR, to complete IV antibiotics today  Consultants:  PCCM - signed off 11/9  Procedures:  Lumbar puncture  Antimicrobials:  Vancomycin 11/6 - 11/7 Ceftriaxone 11/5>> Metronidazole 11/7>> Ampicillin 01/03/10/7 Acyclovir 01/03/10/6   Subjective: Patient seen examined bedside, resting calmly.  Lying in bed.  Spouse present.  INR now therapeutic, discontinuing heparin drip.  Remains on IV antibiotics, to complete 7-day course today.  No other specific questions, concerns at this time.  Awaiting insurance authorization for CIR.  Denies headache, no dizziness, no chest pain, no palpitations, no shortness of breath, no current abdominal pain, no current fever, no chills/night sweats, no nausea/vomiting/diarrhea, no focal weakness, no cough/congestion, no paresthesias.  No acute events overnight per nursing staff.  Objective: Vitals:   01/08/23 2300 01/09/23 0038 01/09/23 0632 01/09/23 0730  BP: (!) 185/68 (!) 157/76 (!) 151/78 (!) 171/93  Pulse: 70 73 83 87  Resp: 17  18 16   Temp: 97.8 F (36.6 C) 98.4 F (36.9 C) 98.3 F (36.8 C) 98.2 F (36.8 C)  TempSrc: Oral Oral Oral Oral  SpO2: 96% 98% 97% 97%  Weight:      Height:        Intake/Output Summary (Last 24 hours) at 01/09/2023 1024 Last data filed at 01/08/2023 1153 Gross per 24 hour  Intake 425.33 ml  Output --  Net 425.33 ml   Filed Weights   01/06/23 0500 01/07/23 0623 01/08/23 0525  Weight: 86.4 kg 85.7 kg 84.9 kg    Examination:  Physical Exam: GEN: NAD, alert and oriented x 3, elderly, ill in appearance HEENT: NCAT, PERRL, EOMI, sclera clear, MMM PULM: CTAB w/o wheezes/crackles, normal respiratory effort, on room air CV: RRR w/o M/G/R GI: abd soft, NTND, NABS, no R/G/M MSK: no peripheral edema, muscle strength globally  intact 5/5 bilateral upper/lower extremities NEURO: CN II-XII intact, no focal deficits, sensation to light touch intact PSYCH: normal mood/affect Integumentary: No concerning  rashes/lesions/wounds noted on exposed skin surfaces    Data Reviewed: I have personally reviewed following labs and imaging studies  CBC: Recent Labs  Lab 01/04/23 0049 01/05/23 0355 01/06/23 0410 01/07/23 0503 01/08/23 0819 01/09/23 0633  WBC 8.0 19.8* 10.2 7.2 7.4 7.6  NEUTROABS 7.6  --   --   --   --   --   HGB 11.7* 9.8* 9.1* 8.6* 10.1* 9.5*  HCT 35.1* 31.0* 28.7* 26.4* 30.4* 28.0*  MCV 93.6 98.1 98.6 94.0 93.8 92.4  PLT 271 207 188 193 209 232   Basic Metabolic Panel: Recent Labs  Lab 01/04/23 0049 01/05/23 0355 01/06/23 0410 01/07/23 0503 01/08/23 0819  NA 132* 133* 135 138 138  K 3.9 3.6 4.2 3.6 3.6  CL 103 110 115* 112* 110  CO2 19* 15* 17* 18* 17*  GLUCOSE 182* 220* 212* 180* 133*  BUN 21 26* 23 14 10   CREATININE 1.34* 1.69* 1.47* 1.02 0.87  CALCIUM 8.0* 6.7* 7.3* 8.3* 8.3*  MG  --  1.5* 2.4 2.1 1.8  PHOS  --  3.8  --   --   --    GFR: Estimated Creatinine Clearance: 70.3 mL/min (by C-G formula based on SCr of 0.87 mg/dL). Liver Function Tests: Recent Labs  Lab 01/04/23 0049  AST 40  ALT 29  ALKPHOS 65  BILITOT 0.7  PROT 5.5*  ALBUMIN 3.1*   No results for input(s): "LIPASE", "AMYLASE" in the last 168 hours. No results for input(s): "AMMONIA" in the last 168 hours. Coagulation Profile: Recent Labs  Lab 01/04/23 0101 01/08/23 0819 01/09/23 0633  INR 1.0 1.6* 2.9*   Cardiac Enzymes: No results for input(s): "CKTOTAL", "CKMB", "CKMBINDEX", "TROPONINI" in the last 168 hours. BNP (last 3 results) No results for input(s): "PROBNP" in the last 8760 hours. HbA1C: No results for input(s): "HGBA1C" in the last 72 hours. CBG: Recent Labs  Lab 01/08/23 0746 01/08/23 1143 01/08/23 1711 01/08/23 2137 01/09/23 0729  GLUCAP 128* 137* 129* 102* 106*   Lipid Profile: No results for input(s): "CHOL", "HDL", "LDLCALC", "TRIG", "CHOLHDL", "LDLDIRECT" in the last 72 hours. Thyroid Function Tests: No results for input(s): "TSH", "T4TOTAL",  "FREET4", "T3FREE", "THYROIDAB" in the last 72 hours. Anemia Panel: No results for input(s): "VITAMINB12", "FOLATE", "FERRITIN", "TIBC", "IRON", "RETICCTPCT" in the last 72 hours. Sepsis Labs: Recent Labs  Lab 01/04/23 0307 01/04/23 1352 01/04/23 1834 01/04/23 2228  LATICACIDVEN 2.0* 1.7 1.9 1.3    Recent Results (from the past 240 hour(s))  Blood Culture (routine x 2)     Status: None   Collection Time: 01/04/23  1:01 AM   Specimen: BLOOD  Result Value Ref Range Status   Specimen Description BLOOD SITE NOT SPECIFIED  Final   Special Requests   Final    BOTTLES DRAWN AEROBIC AND ANAEROBIC Blood Culture results may not be optimal due to an excessive volume of blood received in culture bottles   Culture   Final    NO GROWTH 5 DAYS Performed at [facility], [address] [place], [place] [address]    Report Status 01/09/2023 FINAL  Final  Blood Culture (routine x 2)     Status: None   Collection Time: 01/04/23  2:17 AM   Specimen: BLOOD  Result Value Ref Range Status   Specimen Description BLOOD BLOOD LEFT ARM  Final  Special Requests   Final    BOTTLES DRAWN AEROBIC AND ANAEROBIC Blood Culture adequate volume   Culture   Final    NO GROWTH 5 DAYS Performed at [facility] Lab, [address], [place], [place] [address]    Report Status 01/09/2023 FINAL  Final  SARS Coronavirus 2 by RT PCR (hospital order, performed in [facility] hospital lab) *cepheid single result test* Anterior Nasal Swab     Status: None   Collection Time: 01/04/23  3:23 AM   Specimen: Anterior Nasal Swab  Result Value Ref Range Status   SARS Coronavirus 2 by RT PCR NEGATIVE NEGATIVE Final    Comment: Performed at [facility], [address] [place], [place] [address]  CSF culture w Gram Stain     Status: None   Collection Time: 01/04/23  2:15 PM   Specimen: CSF; Cerebrospinal Fluid  Result Value Ref Range Status   Specimen Description CSF  Final   Special Requests NONE  Final    Gram Stain NO WBC SEEN NO ORGANISMS SEEN CYTOSPIN SMEAR   Final   Culture   Final    NO GROWTH 3 DAYS Performed at [facility], [address], [place], [place] [address]    Report Status 01/08/2023 FINAL  Final  Culture, Fungus without Smear     Status: None (Preliminary result)   Collection Time: 01/04/23  2:15 PM   Specimen: PATH Cytology CSF; Cerebrospinal Fluid  Result Value Ref Range Status   Specimen Description CSF  Final   Special Requests NONE  Final   Culture   Final    NO GROWTH 4 DAYS Performed at [facility], [address], [place], [place] [address]    Report Status PENDING  Incomplete  MRSA Next Gen by PCR, Nasal     Status: None   Collection Time: 01/04/23  5:48 PM   Specimen: Nasal Mucosa; Nasal Swab  Result Value Ref Range Status   MRSA by PCR Next Gen NOT DETECTED NOT DETECTED Final    Comment: (NOTE) The GeneXpert MRSA Assay (FDA approved for NASAL specimens only), is one component of a comprehensive MRSA colonization surveillance program. It is not intended to diagnose MRSA infection nor to guide or monitor treatment for MRSA infections. Test performance is not FDA approved in patients less than 22 years old. Performed at [facility], [address], [place], [place] [address]          Radiology Studies: No results found.      Scheduled Meds:  acetaminophen  1,000 mg Oral Q8H   amLODipine  5 mg Oral Daily   carbamazepine  200 mg Oral QID   Chlorhexidine Gluconate Cloth  6 each Topical Daily   gabapentin  600 mg Oral TID   insulin aspart  0-5 Units Subcutaneous QHS   insulin aspart  0-9 Units Subcutaneous TID WC   insulin aspart  3 Units Subcutaneous TID WC   insulin glargine-yfgn  5 Units Subcutaneous Daily   metroNIDAZOLE  500 mg Oral Q12H   mouth rinse  15 mL Mouth Rinse 4 times per day   pantoprazole  40 mg Oral Daily   rosuvastatin  10 mg Oral Daily   sodium chloride flush  10-40 mL Intracatheter Q12H    Warfarin - Pharmacist Dosing Inpatient   Does not apply q1600   Continuous Infusions:     LOS: 5 days    Time spent: 50  minutes spent on chart review, discussion with nursing staff, consultants, updating family and interview/physical exam; more than 50% of that time was spent in counseling and/or coordination of care.    Alvira Philips [place], DO [facility] Available via Epic secure chat 7am-7pm After these hours, please refer to coverage provider listed on [email or web address] 01/09/2023, 10:24 AM

## 2023-01-10 ENCOUNTER — Other Ambulatory Visit (HOSPITAL_COMMUNITY): Payer: Self-pay

## 2023-01-10 ENCOUNTER — Telehealth (HOSPITAL_COMMUNITY): Payer: Self-pay | Admitting: Pharmacy Technician

## 2023-01-10 DIAGNOSIS — R5381 Other malaise: Secondary | ICD-10-CM | POA: Diagnosis not present

## 2023-01-10 LAB — CBC WITH DIFFERENTIAL/PLATELET
Abs Immature Granulocytes: 0.18 10*3/uL — ABNORMAL HIGH (ref 0.00–0.07)
Basophils Absolute: 0 10*3/uL (ref 0.0–0.1)
Basophils Relative: 0 %
Eosinophils Absolute: 0.3 10*3/uL (ref 0.0–0.5)
Eosinophils Relative: 3 %
HCT: 26.3 % — ABNORMAL LOW (ref 39.0–52.0)
Hemoglobin: 8.7 g/dL — ABNORMAL LOW (ref 13.0–17.0)
Immature Granulocytes: 2 %
Lymphocytes Relative: 14 %
Lymphs Abs: 1.3 10*3/uL (ref 0.7–4.0)
MCH: 30.6 pg (ref 26.0–34.0)
MCHC: 33.1 g/dL (ref 30.0–36.0)
MCV: 92.6 fL (ref 80.0–100.0)
Monocytes Absolute: 1.1 10*3/uL — ABNORMAL HIGH (ref 0.1–1.0)
Monocytes Relative: 12 %
Neutro Abs: 6.3 10*3/uL (ref 1.7–7.7)
Neutrophils Relative %: 69 %
Platelets: 276 10*3/uL (ref 150–400)
RBC: 2.84 MIL/uL — ABNORMAL LOW (ref 4.22–5.81)
RDW: 12.4 % (ref 11.5–15.5)
WBC: 9.1 10*3/uL (ref 4.0–10.5)
nRBC: 0 % (ref 0.0–0.2)

## 2023-01-10 LAB — GLUCOSE, CAPILLARY
Glucose-Capillary: 133 mg/dL — ABNORMAL HIGH (ref 70–99)
Glucose-Capillary: 186 mg/dL — ABNORMAL HIGH (ref 70–99)
Glucose-Capillary: 191 mg/dL — ABNORMAL HIGH (ref 70–99)
Glucose-Capillary: 196 mg/dL — ABNORMAL HIGH (ref 70–99)

## 2023-01-10 LAB — COMPREHENSIVE METABOLIC PANEL
ALT: 36 U/L (ref 0–44)
AST: 31 U/L (ref 15–41)
Albumin: 2.4 g/dL — ABNORMAL LOW (ref 3.5–5.0)
Alkaline Phosphatase: 52 U/L (ref 38–126)
Anion gap: 8 (ref 5–15)
BUN: 12 mg/dL (ref 8–23)
CO2: 21 mmol/L — ABNORMAL LOW (ref 22–32)
Calcium: 8 mg/dL — ABNORMAL LOW (ref 8.9–10.3)
Chloride: 108 mmol/L (ref 98–111)
Creatinine, Ser: 0.84 mg/dL (ref 0.61–1.24)
GFR, Estimated: >60 mL/min (ref >60)
Glucose, Bld: 147 mg/dL — ABNORMAL HIGH (ref 70–99)
Potassium: 3.8 mmol/L (ref 3.5–5.1)
Sodium: 137 mmol/L (ref 135–145)
Total Bilirubin: 0.7 mg/dL (ref <1.2)
Total Protein: 4.7 g/dL — ABNORMAL LOW (ref 6.5–8.1)

## 2023-01-10 LAB — PROTIME-INR
INR: 2.9 — ABNORMAL HIGH (ref 0.8–1.2)
Prothrombin Time: 30.9 s — ABNORMAL HIGH (ref 11.4–15.2)

## 2023-01-10 MED ORDER — LOSARTAN POTASSIUM 25 MG PO TABS
25.0000 mg | ORAL_TABLET | Freq: Every day | ORAL | Status: DC
  Administered 2023-01-10 – 2023-01-17 (×8): 25 mg via ORAL
  Filled 2023-01-10 (×8): qty 1

## 2023-01-10 MED ORDER — POTASSIUM CHLORIDE 20 MEQ PO PACK
20.0000 meq | PACK | Freq: Once | ORAL | Status: AC
  Administered 2023-01-10: 20 meq via ORAL
  Filled 2023-01-10: qty 1

## 2023-01-10 MED ORDER — SODIUM CHLORIDE 0.9 % IV SOLN
2.0000 g | INTRAVENOUS | Status: AC
  Administered 2023-01-10: 2 g via INTRAVENOUS
  Filled 2023-01-10: qty 20

## 2023-01-10 MED ORDER — METRONIDAZOLE 500 MG PO TABS
500.0000 mg | ORAL_TABLET | Freq: Two times a day (BID) | ORAL | Status: AC
  Administered 2023-01-10 (×2): 500 mg via ORAL
  Filled 2023-01-10 (×2): qty 1

## 2023-01-10 MED ORDER — ROSUVASTATIN CALCIUM 5 MG PO TABS
10.0000 mg | ORAL_TABLET | Freq: Every day | ORAL | Status: DC
  Administered 2023-01-10 – 2023-01-17 (×8): 10 mg via ORAL
  Filled 2023-01-10 (×8): qty 2

## 2023-01-10 MED ORDER — WARFARIN 0.5 MG HALF TABLET
0.5000 mg | ORAL_TABLET | Freq: Once | ORAL | Status: AC
  Administered 2023-01-10: 0.5 mg via ORAL
  Filled 2023-01-10: qty 1

## 2023-01-10 MED ORDER — MAGNESIUM GLUCONATE 500 MG PO TABS
250.0000 mg | ORAL_TABLET | Freq: Every day | ORAL | Status: DC
  Administered 2023-01-10: 250 mg via ORAL
  Filled 2023-01-10: qty 1

## 2023-01-10 NOTE — Progress Notes (Signed)
Inpatient Rehabilitation Admission Medication Review by a Pharmacist  A complete drug regimen review was completed for this patient to identify any potential clinically significant medication issues.  High Risk Drug Classes Is patient taking? Indication by Medication  Antipsychotic No   Anticoagulant Yes Warfarin: PE  Antibiotic Yes Flagyl, ceftriaxone: ileitis  Opioid Yes Oxycodone: pain  Antiplatelet No   Hypoglycemics/insulin Yes Insulin, SSI: DM2  Vasoactive Medication Yes Amlodipine, losartan: hypertension  Chemotherapy No   Other Yes Tylenol: pain Carbamazepine, Gabapentin: Trigeminal neuralgia Magnesium gluconate: hypertension Protonix: GERD Mylanta: indigestion Bisacodyl, Miralax, fleet: constipation Benadryl: itching Robitussin: cough Robaxin: muscle spasms Zofran: nausea     Type of Medication Issue Identified Description of Issue Recommendation(s)  Drug Interaction(s) (clinically significant)     Duplicate Therapy     Allergy     No Medication Administration End Date     Incorrect Dose     Additional Drug Therapy Needed  Trazodone, crestor   Significant med changes from prior encounter (inform family/care partners about these prior to discharge).  Restart or discontinue as appropriate. Communicate medication changes with patient/family at discharge  Other       Clinically significant medication issues were identified that warrant physician communication and completion of prescribed/recommended actions by midnight of the next day:  No  Name of provider notified for urgent issues identified: Wendi Maya   Provider Method of Notification: secure chat    Pharmacist comments: Note has meds as active, f/u plan/orders   Time spent performing this drug regimen review (minutes): 60   Thank you for allowing pharmacy to be a part of this patient's care.   Signe Colt, PharmD 01/10/2023 11:06 AM    **Pharmacist phone directory can be found on  [email or web address] listed under [facility]**

## 2023-01-10 NOTE — Progress Notes (Signed)
Inpatient Rehabilitation Center Individual Statement of Services  Patient Name:  Larry Lane  Date:  01/10/2023  Welcome to the Inpatient Rehabilitation Center.  Our goal is to provide you with an individualized program based on your diagnosis and situation, designed to meet your specific needs.  With this comprehensive rehabilitation program, you will be expected to participate in at least 3 hours of rehabilitation therapies Monday-Friday, with modified therapy programming on the weekends.  Your rehabilitation program will include the following services:  Physical Therapy (PT), Occupational Therapy (OT), Speech Therapy (ST), 24 hour per day rehabilitation nursing, Therapeutic Recreaction (TR), Care Coordinator, Rehabilitation Medicine, Nutrition Services, and Pharmacy Services  Weekly team conferences will be held on Wednesday to discuss your progress.  Your Inpatient Rehabilitation Care Coordinator will talk with you frequently to get your input and to update you on team discussions.  Team conferences with you and your family in attendance may also be held.  Expected length of stay: 7-10 days  Overall anticipated outcome: supervision level  Depending on your progress and recovery, your program may change. Your Inpatient Rehabilitation Care Coordinator will coordinate services and will keep you informed of any changes. Your Inpatient Rehabilitation Care Coordinator's name and contact numbers are listed  below.  The following services may also be recommended but are not provided by the Inpatient Rehabilitation Center:   Home Health Rehabiltiation Services Outpatient Rehabilitation Services    Arrangements will be made to provide these services after discharge if needed.  Arrangements include referral to agencies that provide these services.  Your insurance has been verified to be:  [employer] Your primary doctor is:  Solo Wilcock  Pertinent information will be shared with your  doctor and your insurance company.  Inpatient Rehabilitation Care Coordinator:  Dossie Der, [place] [phone] or Luna Glasgow  Information discussed with and copy given to patient by: Lucy Chris, 01/10/2023, 8:54 AM

## 2023-01-10 NOTE — Discharge Summary (Signed)
Physician Discharge Summary  Patient ID: Larry Lane MRN: [id number] DOB/AGE: Aug 06, 1940 82 y.o.  Admit date: 01/09/2023 Discharge date: 01/17/2023  Discharge Diagnoses:  Principal Problem:   Sepsis (HCC) Active Problems:   Anemia   Fecal occult blood test positive   Acute pulmonary embolism (HCC)   Preop cardiovascular exam Active problems: Pellety secondary to sepsis Acute pulmonary embolism Hypertension Hyperlipidemia Diabetes mellitus type 2 Trigeminal neuralgia Gastroesophageal reflux disease Anemia  Discharged Condition: stable  Significant Diagnostic Studies:  Narrative & Impression  CLINICAL DATA:  Pulmonary embolism (PE) suspected, high prob. Follow-up previously questioned left upper and left lower lobe pulmonary emboli.   EXAM: CT ANGIOGRAPHY CHEST WITH CONTRAST   TECHNIQUE: Multidetector CT imaging of the chest was performed using the standard protocol during bolus administration of intravenous contrast. Multiplanar CT image reconstructions and MIPs were obtained to evaluate the vascular anatomy.   RADIATION DOSE REDUCTION: This exam was performed according to the departmental dose-optimization program which includes automated exposure control, adjustment of the mA and/or kV according to patient size and/or use of iterative reconstruction technique.   CONTRAST:  75mL OMNIPAQUE IOHEXOL 350 MG/ML SOLN   COMPARISON:  CTA chest 01/04/2023.   FINDINGS: Cardiovascular: Satisfactory opacification of the pulmonary arteries to the segmental level. No evidence of pulmonary embolism. Normal heart size. No pericardial effusion. Extensive coronary artery calcifications and atherosclerotic calcifications of the thoracic aorta.   Mediastinum/Nodes: No enlarged mediastinal, hilar, or axillary lymph nodes. Thyroid gland, trachea, and esophagus demonstrate no significant findings.   Lungs/Pleura: No consolidation or pulmonary edema. Trace left pleural  effusion. No pneumothorax.   Upper Abdomen: No acute abnormality.   Musculoskeletal: No chest wall abnormality. No acute or significant osseous findings.   Review of the MIP images confirms the above findings.   IMPRESSION: 1. No evidence of pulmonary embolism. 2. Trace left pleural effusion. 3. Extensive coronary artery calcifications.   Aortic Atherosclerosis (ICD10-I70.0).     Electronically Signed   By: Orvan Falconer M.D.   On: 01/13/2023 17:29      Narrative & Impression  CLINICAL DATA:  Left thigh pain and weakness.   EXAM: MR OF THE LEFT FEMUR WITHOUT CONTRAST   TECHNIQUE: Multiplanar, multisequence MR imaging of the left thigh was performed. No intravenous contrast was administered.   COMPARISON:  Left femur x-rays dated January 06, 2023.   FINDINGS: Bones/Joint/Cartilage   No marrow signal abnormality. No fracture or dislocation. Mild bilateral hip degenerative changes. Prior left knee medial unicompartmental arthroplasty. Small bilateral knee joint effusions.   Ligaments   Collateral ligaments are intact.   Muscles and Tendons Intact. Scattered muscle edema within the left thigh, predominantly involving the sartorius, vastus medialis, and vastus lateralis muscles. There is also involvement of the distal left iliopsoas muscle. Mild fatty infiltration of the left thigh muscles, with more focal atrophy of the adductor magnus and rectus femoris muscles. No significant muscle edema in the visualized right thigh on the coronal sequences.   Soft tissue Mild thigh soft tissue swelling, greatest laterally. No fluid collection or hematoma. No soft tissue mass. Moderate right and trace left hydroceles.   IMPRESSION: 1. Scattered muscle edema within the left thigh, predominantly involving the sartorius, vastus medialis, and vastus lateralis muscles. There is also involvement of the distal left iliopsoas muscle. Findings are nonspecific and may be  related to myositis or denervation. 2. No acute osseous abnormality.     Electronically Signed   By: Obie Dredge M.D.   On: 01/13/2023  12:12      Labs:  Basic Metabolic Panel: Recent Labs  Lab 01/16/23 0614  NA 137  K 3.6  CL 104  CO2 28  GLUCOSE 173*  BUN 9  CREATININE 0.72  CALCIUM 8.0*    CBC: Recent Labs  Lab 01/15/23 0459 01/16/23 0614 01/17/23 0703  WBC 8.4 7.2 8.4  HGB 8.9* 9.0* 10.4*  HCT 27.4* 27.8* 31.2*  MCV 94.8 95.5 94.5  PLT 484* 578* 731*    CBG: Recent Labs  Lab 01/16/23 1156 01/16/23 1658 01/16/23 2058 01/17/23 0612 01/17/23 1140  GLUCAP 215* 235* 195* 168* 153*    Brief HPI:   Larry Lane is a 82 y.o. male brought in by EMS to [facility] emergency department on 01/04/2023. According to the patient's wife, the patient had not been feeling well for approximately 1 week. He had complaints of stomach cramping, bilateral leg cramping and to presentation, shaking chill with altered mental status. This prompted her to call 911. Patient was tachycardic with SaO2 of 89% on arrival. Rate fever. Chest x-ray was negative for pneumonia. CT head negative for acute abnormality. Troponins were mildly elevated. CTA PE study was obtained and significant for possible PE with possible heart strain and no evidence of pneumonia. Heparin infusion started. He developed worsening fever to 103. Urgent lumbar puncture performed by interventional radiology and empiric coverage with multiple antimicrobials and acyclovir were started. He was admitted to the ICU and critical care medicine consulted. Required vasopressor support. Has a history of trigeminal neuralgia on gabapentin and Tegretol. These were held initially. The left acute kidney injury with creatinine 1.69 and BUN of 26. High-sensitivity troponin 2150 on 11/07. Norepinephrine discontinued 11/07. TTE showed RV dysfunction consistent with McConnell sign, LV is hyperdynamic. Transferred to medicine service  with telemetry on 11-08. Films of left knee performed unrevealing of infectious source. CSF culture no growth at 2 days. Continued on ceftriaxone 2 g IV every 24 hours and metronidazole 500 mg every 12 hours. Serum creatinine has trended down and now normal. Unable to transition to DOAC secondary to interactions with carbamazepine. He was started on warfarin on 11/09 with a goal INR of 2-3. Status now back to baseline. He has a history of type 2 diabetes mellitus and on sliding scale insulin, meal coverage and long-acting insulin. His home amlodipine has been restarted and losartan held due to kidney function. This time Crestor restarted. Received last dose of metronidazole this evening, 11/11. Was ambulated in the hallway with mobility specialist with contact-guard assist and rolling walker. Remittent left knee buckling.    Hospital Course: Larry Lane was admitted to rehab 01/09/2023 for inpatient therapies to consist of PT, ST and OT at least three hours five days a week. Past admission physiatrist, therapy team and rehab RN have worked together to provide customized collaborative inpatient rehab.  On follow-up labs noted slight drift in hemoglobin from 9.5 to 8.7.  Hemoccult stools ordered and CBC rechecked on 11/13 with mild improvement to 9.1.  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  MRI left femur obtained.  CTA obtained given questionable findings on prior imaging of pulmonary embolism.  Neurology consulted on 11/15 with positive fecal occult blood test and drop in hemoglobin.  Coumadin was held and cardiology consulted for preprocedure evaluation.  Pete CTA negative for PE and discussed with Rahul/PCCM APP.  He reviewed the chart and felt that Coumadin can  be discontinued.  A limited repeat echocardiogram was performed and Coumadin discontinued.  Improvement in femoral nerve symptoms.  Findings on MRI are  consistent with denervation and discussed with the patient and his wife.  Consider EMG/NCS as outpatient depending upon symptoms.  Audiology recommended delay an elective colonoscopy until right ventricle evaluated further.  Lipid panel updated.  Limited echocardiogram with Definity obtained.  Hemoglobin improved to 10.4 on 11/19.  He was ready for discharge home with outpatient recommendations from cardiology and gastroenterology.  Blood pressures were monitored on TID basis and amlodipine 5 mg daily continued.  Losartan 25 mg daily restarted.  Magnesium gluconate 250 mg started nightly.  Diabetes managed with QID CBGs and carb modified diet. He continued on Semglee 6 units daily, Novolog 3 units TID and SSI. Acarbose 25 mg added TID.  He will resume his V-go 20 at home and discussed acarbose is optional.  He will discuss this medication with his primary care provider.  Rehab course: During patient's stay in rehab weekly team conferences were held to monitor patient's progress, set goals and discuss barriers to discharge. At admission, patient required mod assist with basic self-care skills and IADL, assist with mobility.  He has had improvement in activity tolerance, balance, postural control as well as ability to compensate for deficits. He has had improvement in functional use RUE/LUE  and RLE/LLE as well as improvement in awareness. Patient has met 10 of 10 long term goals due to improved activity tolerance, improved balance, ability to compensate for deficits, functional use of  LEFT lower extremity, improved attention, and improved awareness.  Patient to discharge at overall Supervision level.  Patient's care partner is independent to provide the necessary physical and cognitive assistance at discharge.    Discharge disposition: 01-Home or Self Care     Diet: carb modified  Special Instructions: No driving, alcohol consumption or tobacco use.  Follow-up with GI for further work-up of  anemia/colonoscopy.   Follow-up with cardiology for pre-colonoscopy evaluation and outpatient sleep evaluation.    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  Discharge Instructions     Discharge patient   Complete by: As directed    Discharge disposition: 01-Home or Self Care   Discharge patient date: 01/17/2023      Allergies as of 01/17/2023   No Known Allergies      Medication List     STOP taking these medications    aspirin EC 81 MG tablet   insulin aspart 100 UNIT/ML injection Commonly known as: novoLOG   insulin glargine-yfgn 100 UNIT/ML injection Commonly known as: SEMGLEE   metroNIDAZOLE 500 MG tablet Commonly known as: FLAGYL       TAKE these medications    acetaminophen 325 MG tablet Commonly known as: TYLENOL Take 1-2 tablets (325-650 mg total) by mouth every 4 (four) hours as needed for mild pain (pain score 1-3). What changed:  medication strength how much to take when to take this reasons to take this   amLODipine 5 MG tablet Commonly known as: NORVASC Take 1 tablet by mouth daily.   Apoaequorin 10 MG Caps Take 1 capsule by mouth daily. Prevagen   carbamazepine 200 MG 12 hr capsule Commonly known as: CARBATROL Take 200 mg by mouth 4 (four) times daily.   cholestyramine light 4 GM/DOSE powder Commonly known as: PREVALITE Take 4 g by mouth 2 (two) times daily with a meal.   gabapentin 600 MG tablet Commonly known as: NEURONTIN Take 600 mg by mouth 3 (  three) times daily.   glucagon 1 MG injection Inject 1 mg into the skin.   losartan 25 MG tablet Commonly known as: COZAAR Take 1 tablet (25 mg total) by mouth daily. Start taking on: January 18, 2023   Multi-Vitamin tablet Take 1 tablet by mouth daily.   omeprazole 20 MG capsule Commonly known as: PRILOSEC Take 20 mg by mouth daily.   rosuvastatin 10 MG tablet Commonly known as: CRESTOR Take 1 tablet by mouth daily.        Follow-up Information     Raulkar,  Drema Pry, MD Follow up.   Specialty: Physical Medicine and Rehabilitation Why: As needed Contact information: [address] [place] [address] [phone]         Jerl Mina, MD Follow up.   Specialty: Family Medicine Why: Call the office in 1-2 days to make arrangments for hospital follow-up appointment. Contact information: [address] [place] [address] [phone]         Doristine Locks V, DO Follow up.   Specialty: Gastroenterology Why: Call the office in 1-2 days to make arrangements for hsopital follow-up appointment. Contact information: [address] [place] [address] [phone]         Creig Hines, NP. Go to.   Specialties: Cardiology, Radiology Why: (follow-up from hospitalization consultation) Contact information: [address] [place] [address] [phone]                 Signed: Milinda Antis 01/17/2023, 12:07 PM

## 2023-01-10 NOTE — Evaluation (Addendum)
Speech Language Pathology Assessment and Plan  Patient Details  Name: Larry Lane MRN: [id number] Date of Birth: 06/02/1940  SLP Diagnosis: Cognitive Impairments  Rehab Potential: Good ELOS: 7-9 days    Today's Date: 01/10/2023 SLP Individual Time: 1100-1158 SLP Individual Time Calculation (min): 58 min   Hospital Problem: Principal Problem:   Sepsis (HCC)  Past Medical History:  Past Medical History:  Diagnosis Date   Abscess of back    Arthritis    Bacterial infection 2015   Cancer (HCC)    skin    Diabetes mellitus without complication (HCC)    Dysrhythmia    Edema    RIGHT ANKLE   GERD (gastroesophageal reflux disease)    Hypertension    Trigeminal nerve disorder    Trigeminal neuralgia    Past Surgical History:  Past Surgical History:  Procedure Laterality Date   APPENDECTOMY  2010   CATARACT EXTRACTION W/PHACO Right 01/26/2017   Procedure: CATARACT EXTRACTION PHACO AND INTRAOCULAR LENS PLACEMENT (IOC);  Surgeon: Nevada Crane, MD;  Location: [facility] ORS;  Service: Ophthalmology;  Laterality: Right;  Lot # [id number] H [place]: 00:48.2 AP%: 9.9 CDE: 5.35   I&D of back abscess     TRIGEMINAL NERVE DECOMPRESSION      Assessment / Plan / Recommendation Clinical Impression  Larry Lane is a 82 year old male brought in by EMS to [facility] emergency department on 01/04/2023. According to the patient's wife, the patient had not been feeling well for approximately 1 week. He had complaints of stomach cramping, bilateral leg cramping and to presentation, shaking chill with altered mental status. This prompted her to call 911. Patient was tachycardic with SaO2 of 89% on arrival. Rate fever. Chest x-ray was negative for pneumonia. CT head negative for acute abnormality. Troponins were mildly elevated. CTA PE study was obtained and significant for possible PE with possible heart strain and no evidence of pneumonia. Heparin infusion started. He developed worsening fever  to 103. Urgent lumbar puncture performed by interventional radiology and empiric coverage with multiple antimicrobials and acyclovir were started. He was admitted to the ICU and critical care medicine consulted. .The patient requires inpatient medicine and rehabilitation evaluations and services for ongoing dysfunction secondary to septic shock, PE, debility. Patient was admitted to CIR on 01/09/23  Pt presents with a mild cognitive impairment. SLUMS administered with pt score of 21/30 (WNL>27). Strengths were in mental manipulation, language, recall, and orientation. Deficits observed in executive function and immediate recall. Informally, pt unable to recall events leading up to hospitalization and report of PLOF was vague. He was able to read therapy schedule, identify upcoming sessions, and past sessions. When asked about more detailed information he stated, "I'm not sure, you'll have to ask my wife about that." Despite, inability to recall personal information pt with limited awareness of cognitive deficits when asked.   Pt would benefit from skilled SLP services to maximize cognitive linguistic function in order to maximize his independence prior to discharge. Anticipate pt will require intermittent supervision at home and f/u home health/ outpatient SLP services.    Skilled Therapeutic Interventions          Informal assessment measures, and SLUMS administered. Please see full report for additional details.     SLP Assessment  Patient will need skilled Speech Lanaguage Pathology Services during CIR admission    Recommendations  SLP Diet Recommendations: Age appropriate regular solids;Thin Medication Administration: Whole meds with liquid Supervision: Patient able to self feed Compensations: Minimize environmental  distractions;Small sips/bites;Slow rate Postural Changes and/or Swallow Maneuvers: Seated upright 90 degrees Oral Care Recommendations: Oral care BID Patient destination: Home Follow  up Recommendations: 24 hour supervision/assistance;Outpatient SLP;Home Health SLP Equipment Recommended: None recommended by SLP    SLP Frequency 1 to 3 out of 7 days   SLP Duration  SLP Intensity  SLP Treatment/Interventions 7-9 days  Minumum of 1-2 x/day, 30 to 90 minutes  Cognitive remediation/compensation;Internal/external aids;Environmental controls;Therapeutic Activities;Patient/family education;Multimodal communication approach;Functional tasks    Pain Pain Assessment Pain Scale: 0-10 Pain Score: 8  Pain Type: Chronic pain Pain Location: Face Pain Orientation: Right Pain Descriptors / Indicators: Burning Pain Onset: Progressive Patients Stated Pain Goal: 1 Pain Intervention(s): Distraction  Prior Functioning Cognitive/Linguistic Baseline: Within functional limits Type of Home: House  Lives With: Spouse Available Help at Discharge: Family;Available 24 hours/day Vocation: Retired  Architectural technologist Overall Cognitive Status: Impaired/Different from baseline Arousal/Alertness: Awake/alert Orientation Level: Oriented X4 Year: 2024 Month: November Day of Week: Incorrect Attention: Focused;Selective Focused Attention: Appears intact Selective Attention: Impaired Selective Attention Impairment: Verbal complex;Functional complex Memory: Impaired Memory Impairment: Decreased short term memory;Retrieval deficit Decreased Short Term Memory: Verbal basic;Functional basic Awareness: Impaired Awareness Impairment: Intellectual impairment Problem Solving: Appears intact Safety/Judgment: Appears intact  Comprehension Auditory Comprehension Overall Auditory Comprehension: Appears within functional limits for tasks assessed Interfering Components: Hearing EffectiveTechniques: Increased volume;Repetition Visual Recognition/Discrimination Discrimination: Within Function Limits Reading Comprehension Reading Status: Not tested Expression Expression Primary Mode  of Expression: Verbal Verbal Expression Overall Verbal Expression: Appears within functional limits for tasks assessed Written Expression Dominant Hand: Right Written Expression: Not tested Oral Motor Oral Motor/Sensory Function Overall Oral Motor/Sensory Function: Within functional limits  Care Tool Care Tool Cognition Ability to hear (with hearing aid or hearing appliances if normally used Ability to hear (with hearing aid or hearing appliances if normally used): 1. Minimal difficulty - difficulty in some environments (e.g. when person speaks softly or setting is noisy)   Expression of Ideas and Wants Expression of Ideas and Wants: 4. Without difficulty (complex and basic) - expresses complex messages without difficulty and with speech that is clear and easy to understand   Understanding Verbal and Non-Verbal Content Understanding Verbal and Non-Verbal Content: 3. Usually understands - understands most conversations, but misses some part/intent of message. Requires cues at times to understand  Memory/Recall Ability Memory/Recall Ability : Current season;That he or she is in a hospital/hospital unit    Short Term Goals: Week 1: SLP Short Term Goal 1 (Week 1): STGs= LTGs due to ELOS  Refer to Care Plan for Long Term Goals  Recommendations for other services: None   Discharge Criteria: Patient will be discharged from SLP if patient refuses treatment 3 consecutive times without medical reason, if treatment goals not met, if there is a change in medical status, if patient makes no progress towards goals or if patient is discharged from hospital.  The above assessment, treatment plan, treatment alternatives and goals were discussed and mutually agreed upon: by patient  Renaee Munda 01/10/2023, 3:16 PM

## 2023-01-10 NOTE — Plan of Care (Signed)
  Problem: RH Balance Goal: LTG Patient will maintain dynamic standing balance (PT) Description: LTG:  Patient will maintain dynamic standing balance with assistance during mobility activities (PT) Flowsheets (Taken 01/10/2023 1608) LTG: Pt will maintain dynamic standing balance during mobility activities with:: Supervision/Verbal cueing   Problem: Sit to Stand Goal: LTG:  Patient will perform sit to stand with assistance level (PT) Description: LTG:  Patient will perform sit to stand with assistance level (PT) Flowsheets (Taken 01/10/2023 1608) LTG: PT will perform sit to stand in preparation for functional mobility with assistance level: Supervision/Verbal cueing   Problem: RH Bed Mobility Goal: LTG Patient will perform bed mobility with assist (PT) Description: LTG: Patient will perform bed mobility with assistance, with/without cues (PT). Flowsheets (Taken 01/10/2023 1608) LTG: Pt will perform bed mobility with assistance level of: Supervision/Verbal cueing   Problem: RH Bed to Chair Transfers Goal: LTG Patient will perform bed/chair transfers w/assist (PT) Description: LTG: Patient will perform bed to chair transfers with assistance (PT). Flowsheets (Taken 01/10/2023 1608) LTG: Pt will perform Bed to Chair Transfers with assistance level: Supervision/Verbal cueing   Problem: RH Car Transfers Goal: LTG Patient will perform car transfers with assist (PT) Description: LTG: Patient will perform car transfers with assistance (PT). Flowsheets (Taken 01/10/2023 1608) LTG: Pt will perform car transfers with assist:: Supervision/Verbal cueing   Problem: RH Ambulation Goal: LTG Patient will ambulate in controlled environment (PT) Description: LTG: Patient will ambulate in a controlled environment, # of feet with assistance (PT). Flowsheets (Taken 01/10/2023 1608) LTG: Pt will ambulate in controlled environ  assist needed:: Supervision/Verbal cueing LTG: Ambulation distance in controlled  environment: 150' Note: With LRAD Goal: LTG Patient will ambulate in home environment (PT) Description: LTG: Patient will ambulate in home environment, # of feet with assistance (PT). Flowsheets (Taken 01/10/2023 1608) LTG: Pt will ambulate in home environ  assist needed:: Supervision/Verbal cueing LTG: Ambulation distance in home environment: 73' Note: With LRAD   Problem: RH Stairs Goal: LTG Patient will ambulate up and down stairs w/assist (PT) Description: LTG: Patient will ambulate up and down # of stairs with assistance (PT) Flowsheets (Taken 01/10/2023 1608) LTG: Pt will ambulate up/down stairs assist needed:: Contact Guard/Touching assist LTG: Pt will  ambulate up and down number of stairs: 3

## 2023-01-10 NOTE — Progress Notes (Signed)
Inpatient Rehabilitation Care Coordinator Assessment and Plan Patient Details  Name: Larry Lane MRN: [id number] Date of Birth: [birth year]  Today's Date: 01/10/2023  Hospital Problems: Principal Problem:   Sepsis [facility])  Past Medical History:  Past Medical History:  Diagnosis Date   Abscess of back    Arthritis    Bacterial infection 2015   Cancer (HCC)    skin    Diabetes mellitus without complication (HCC)    Dysrhythmia    Edema    RIGHT ANKLE   GERD (gastroesophageal reflux disease)    Hypertension    Trigeminal nerve disorder    Trigeminal neuralgia    Past Surgical History:  Past Surgical History:  Procedure Laterality Date   APPENDECTOMY  2010   CATARACT EXTRACTION W/PHACO Right 01/26/2017   Procedure: CATARACT EXTRACTION PHACO AND INTRAOCULAR LENS PLACEMENT (IOC);  Surgeon: Nevada Crane, MD;  Location: [facility] ORS;  Service: Ophthalmology;  Laterality: Right;  Lot # [id number] H [place]: 00:48.2 AP%: 9.9 CDE: 5.35   I&D of back abscess     TRIGEMINAL NERVE DECOMPRESSION     Social History:  reports that he has never smoked. He has never used smokeless tobacco. He reports that he does not drink alcohol and does not use drugs.  Family / Support Systems Marital Status: Married Patient Roles: Spouse, Parent, Other (Comment) (retiree) Spouse/Significant Other: [phone] Children: Two grown children-son and daughter who live around them Other Supports: Friends and church members Anticipated Caregiver: Peggy Ability/Limitations of Caregiver: Wife is fairly healthy and has been staying with pt due to he is confused at times. Encouraged her once pt is better to go home and take care of herself Caregiver Availability: 24/7 Family Dynamics: Close knit family all live around each other and are involved. Pt and wife feel between family, friends and church members they have good supports  Social History Preferred language: English Religion: Engineer, site Cultural Background: no issues Education: HS Health Literacy - How often do you need to have someone help you when you read instructions, pamphlets, or other written material from your doctor or pharmacy?: Never Writes: Yes Employment Status: Retired (farms some) Marine scientist Issues: No issues Guardian/Conservator: None-according to MD pt is not fully capable of making his own decisions, at times he is confused. Will look toward his wife for any decisions needing to be made while here   Abuse/Neglect Abuse/Neglect Assessment Can Be Completed: Yes Physical Abuse: Denies Verbal Abuse: Denies Sexual Abuse: Denies Exploitation of patient/patient's resources: Denies Self-Neglect: Denies  Patient response to: Social Isolation - How often do you feel lonely or isolated from those around you?: Never  Emotional Status Pt's affect, behavior and adjustment status: Pt is motivated to get moving, not one to sit still. He did say he walked some yesterday while on acute. he is having trouble with his left knee buckling. He was falling some prior to admission and team will evaluate balance, etc while here. Wife hopeful he will be mobile with a rolling walker and safe to do this also when he leaves here. Recent Psychosocial Issues: other health issues falling 6 months prior to admission Psychiatric History: No history has been confused since admission but thisis getting better. Pt states: " I need my brain replaced." He has a sense of humor. Will have team evaluate and get input regarding if would benefit from seeing neuro-psych while here Substance Abuse History: no issues  Patient / Family Perceptions, Expectations & Goals Pt/Family understanding of  illness & functional limitations: Pt and wife can explain his health issues-wife has been here and spoken with the MD's involved and feels has a good understanding of husband's condition and hopeful he will do well here. Premorbid  pt/family roles/activities: husband, father, grandfather, retiree, farmer, friend, church member Anticipated changes in roles/activities/participation: resume Pt/family expectations/goals: Pt states: " I hope to get walking."  Wife states: " I hope he can get his balance back and be more stable walking when he leaves here."  [employer]: None Premorbid Home Care/DME Agencies: Other (Comment) (rw) Transportation available at discharge: wife and children Is the patient able to respond to transportation needs?: Yes In the past 12 months, has lack of transportation kept you from medical appointments or from getting medications?: No In the past 12 months, has lack of transportation kept you from meetings, work, or from getting things needed for daily living?: No  Discharge Planning Living Arrangements: Spouse/significant other Support Systems: Spouse/significant other, Children, Friends/neighbors, Church/faith community Type of Residence: Private residence Insurance Resources: Media planner (specify) Theatre manager Medicare) Surveyor, quantity Resources: Tree surgeon, Family Support Financial Screen Referred: No Living Expenses: Own Money Management: Patient, Spouse Does the patient have any problems obtaining your medications?: No Home Management: both pt did outside work and 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 She has been staying here with him due to his confusion. He seems to be clearing which is good. Aware will be evaluated and goals set for stay here. Care Coordinator Anticipated Follow Up Needs: HH/OP  Clinical Impression Pleasant gentleman who seems to be clearing and recovering from his health issues. His wife is very supportive and involved and has been staying here with him. They have supportive and involved children who are local. Will update after team conference tomorrow  regarding goals and target discharge date.  Lucy Chris 01/10/2023, 8:53 AM

## 2023-01-10 NOTE — Telephone Encounter (Signed)
Pharmacy Patient Advocate Encounter  Received notification from [facility] that Prior Authorization for Savaysa 30 mg has been APPROVED from 01/10/2023 to 01/10/2024. Ran test claim, Copay is $278.02 due to being in Coverage Gap (donut hole). This test claim was processed through [facility]- copay amounts may vary at other pharmacies due to pharmacy/plan contracts, or as the patient moves through the different stages of their insurance plan.

## 2023-01-10 NOTE — H&P (Signed)
Physical Medicine and Rehabilitation Admission H&P   CC: Functional deficits secondary to sepsis, PE  HPI: Larry Lane is a 82 year old male brought in by EMS to [facility] emergency department on 01/04/2023.  According to the patient's wife, the patient had not been feeling well for approximately 1 week.  He had complaints of stomach cramping, bilateral leg cramping and to presentation, shaking chill with altered mental status.  This prompted her to call 911.  Patient was tachycardic with SaO2 of 89% on arrival.  Rate fever.  Chest x-ray was negative for pneumonia.  CT head negative for acute abnormality.  Troponins were mildly elevated.  CTA PE study was obtained and significant for possible PE with possible heart strain and no evidence of pneumonia.  Heparin infusion started.  He developed worsening fever to 103.  Urgent lumbar puncture performed by interventional radiology and empiric coverage with multiple antimicrobials and acyclovir were started.  He was admitted to the ICU and critical care medicine consulted.  Required vasopressor support.  Has a history of trigeminal neuralgia on gabapentin and Tegretol.  These were held initially.  The left acute kidney injury with creatinine 1.69 and BUN of 26.  High-sensitivity troponin 2150 on 11/07.  Norepinephrine discontinued 11/07.  TTE showed RV dysfunction consistent with McConnell sign, LV is hyperdynamic.  Transferred to medicine service with telemetry on 11-08.  Films of left knee performed unrevealing of infectious source.  CSF culture no growth at 2 days.  Continued on ceftriaxone 2 g IV every 24 hours and metronidazole 500 mg every 12 hours.  Serum creatinine has trended down and now normal.  Unable to transition to DOAC secondary to interactions with carbamazepine.  He was started on warfarin on 11/09 with a goal INR of 2-3.  Status now back to baseline.  He has a history of type 2 diabetes mellitus and on sliding scale insulin, meal coverage and  long-acting insulin.  His home amlodipine has been restarted and losartan held due to kidney function.  This time Crestor restarted.  Received last dose of metronidazole this evening, 11/11.  Was ambulated in the hallway with mobility specialist with contact-guard assist and rolling walker.  Remittent left knee buckling.The patient requires inpatient medicine and rehabilitation evaluations and services for ongoing dysfunction secondary to septic shock, PE, debility.  ROS +left knee buckling Past Medical History:  Diagnosis Date   Abscess of back    Arthritis    Bacterial infection 2015   Cancer (HCC)    skin    Diabetes mellitus without complication (HCC)    Dysrhythmia    Edema    RIGHT ANKLE   GERD (gastroesophageal reflux disease)    Hypertension    Trigeminal nerve disorder    Trigeminal neuralgia    Past Surgical History:  Procedure Laterality Date   APPENDECTOMY  2010   CATARACT EXTRACTION W/PHACO Right 01/26/2017   Procedure: CATARACT EXTRACTION PHACO AND INTRAOCULAR LENS PLACEMENT (IOC);  Surgeon: Nevada Crane, MD;  Location: [facility] ORS;  Service: Ophthalmology;  Laterality: Right;  Lot # [id number] H [place]: 00:48.2 AP%: 9.9 CDE: 5.35   I&D of back abscess     TRIGEMINAL NERVE DECOMPRESSION     Family History  Problem Relation Age of Onset   Heart disease Father    Social History:  reports that he has never smoked. He has never used smokeless tobacco. He reports that he does not drink alcohol and does not use drugs. Allergies: No Known Allergies Medications Prior  to Admission  Medication Sig Dispense Refill   acetaminophen (TYLENOL) 500 MG tablet Take 1,000 mg by mouth every 6 (six) hours as needed.     amLODipine (NORVASC) 5 MG tablet Take 1 tablet by mouth daily.     Apoaequorin 10 MG CAPS Take 1 capsule by mouth daily. Prevagen     aspirin EC 81 MG tablet Take 325 mg by mouth daily.     carbamazepine (CARBATROL) 200 MG 12 hr capsule Take 200 mg by mouth 4 (four)  times daily.     cholestyramine light (PREVALITE) 4 GM/DOSE powder Take 4 g by mouth 2 (two) times daily with a meal.     gabapentin (NEURONTIN) 600 MG tablet Take 600 mg by mouth 3 (three) times daily.     glucagon (GLUCAGON EMERGENCY) 1 MG injection Inject 1 mg into the skin.     insulin aspart (NOVOLOG) 100 UNIT/ML injection Inject 3 Units into the skin 3 (three) times daily with meals.     insulin glargine-yfgn (SEMGLEE) 100 UNIT/ML injection Inject 0.05 mLs (5 Units total) into the skin daily.     metroNIDAZOLE (FLAGYL) 500 MG tablet Take 1 tablet (500 mg total) by mouth every 12 (twelve) hours.     Multiple Vitamin (MULTI-VITAMIN) tablet Take 1 tablet by mouth daily.     omeprazole (PRILOSEC) 20 MG capsule Take 20 mg by mouth daily.     rosuvastatin (CRESTOR) 10 MG tablet Take 1 tablet by mouth daily.        Home: Home Living Family/patient expects to be discharged to:: Private residence Living Arrangements: Spouse/significant other Available Help at Discharge: Family, Available 24 hours/day Type of Home: House Home Access: Stairs to enter [employer] of Steps: 6 Entrance Stairs-Rails: Right, Left Home Layout: One level, Other (Comment) (1 step down to living room) Bathroom Shower/Tub: Health visitor: Standard Bathroom Accessibility: Yes Home Equipment: Agricultural consultant (2 wheels) (wife may have given away)   Functional History: Prior Function Prior Level of Function : Independent/Modified Independent, Driving, History of Falls (last six months) Mobility Comments: ind, retired Visual merchandiser ADLs Comments: ind   Functional Status:  Mobility: Bed Mobility Overal bed mobility: Needs Assistance Bed Mobility: Supine to Sit Supine to sit: Used rails, Min assist, HOB elevated Sit to supine: Min assist General bed mobility comments: Min A for LLE elevation, but pt able to advance to EOB. Cues for technique and use of rail and pt able to elevate trunk with  increased time/effort, but no physical assist Transfers Overall transfer level: Needs assistance Equipment used: Rolling walker (2 wheels) Transfers: Sit to/from Stand Sit to Stand: Contact guard assist Bed to/from chair/wheelchair/BSC transfer type:: Step pivot Step pivot transfers: Min assist, From elevated surface (with use of RW) General transfer comment: cues for safe hand placement and increased time to reach full upright posture Ambulation/Gait Ambulation/Gait assistance: Contact guard assist, Min assist Gait Distance (Feet): 20 Feet Assistive device: Rolling walker (2 wheels) Gait Pattern/deviations: Step-through pattern, Decreased stride length, Trunk flexed General Gait Details: Pt demonstrating slow steps with cues for sequencing and RW management. L knee buckling in stance requiring intermittent min A for blocking, but pt able to support with BUE to prevent LOB. Cues for pacing at end of gait trial due to pt walking quicker with increased instability Gait velocity: dec Gait velocity interpretation: <1.31 ft/sec, indicative of household ambulator Pre-gait activities: weight shifting   ADL: ADL Overall ADL's : Needs assistance/impaired Eating/Feeding: Set up, Sitting Grooming: Supervision/safety, Sitting  Upper Body Bathing: Minimal assistance, Sitting Lower Body Bathing: Maximal assistance, Sit to/from stand Upper Body Dressing : Minimal assistance, Sitting Lower Body Dressing: Maximal assistance, Sit to/from stand Toilet Transfer: Minimal assistance, Ambulation, BSC/3in1, Rolling walker (2 wheels), Cueing for safety, Cueing for sequencing (step-pivot transfer) Toileting- Clothing Manipulation and Hygiene: Maximal assistance, Sit to/from stand Functional mobility during ADLs: Minimal assistance, Rolling walker (2 wheels) General ADL Comments: Pt presents with decreased activity tolerance during functional tasks.   Cognition: Cognition Overall Cognitive Status:  Impaired/Different from baseline Arousal/Alertness: Awake/alert Orientation Level: Oriented X4 Attention: Focused, Selective Focused Attention: Impaired Focused Attention Impairment: Verbal complex, Functional complex Selective Attention: Impaired Selective Attention Impairment: Verbal complex, Functional complex Memory: Impaired Memory Impairment: Decreased short term memory, Retrieval deficit Decreased Short Term Memory: Verbal basic, Functional basic Awareness: Appears intact Cognition Arousal: Alert Behavior During Therapy: WFL for tasks assessed/performed Overall Cognitive Status: Impaired/Different from baseline Area of Impairment: Memory, Following commands, Problem solving, Orientation, Safety/judgement Orientation Level: Disoriented to, Place, Situation Memory: Decreased short-term memory Following Commands: Follows one step commands consistently, Follows one step commands with increased time Safety/Judgement: Decreased awareness of safety, Decreased awareness of deficits Problem Solving: Slow processing, Difficulty sequencing, Requires verbal cues General Comments: Increased cues during session and wife reporting that pt is [facility]   Physical Exam: Blood pressure (!) 166/86, pulse 79, temperature 98 F (36.7 C), temperature source Oral, resp. rate 17, SpO2 97%. Physical Exam Gen: no distress, normal appearing HEENT: oral mucosa pink and moist, NCAT Cardio: Reg rate Chest: normal effort, normal rate of breathing Abd: soft, non-distended Ext: no edema Psych: pleasant, normal affect Skin: intact Neuro: Alert and oriented x3 Musculoskeletal: 5/5 except for absent left knee extension and decreased sensation of left thigh.   Results for orders placed or performed during the hospital encounter of 01/09/23 (from the past 48 hour(s))  Glucose, capillary     Status: Abnormal   Collection Time: 01/09/23  4:57 PM  Result Value Ref Range   Glucose-Capillary 182 (H) 70 - 99 mg/dL     Comment: Glucose reference range applies only to samples taken after fasting for at least 8 hours.  Glucose, capillary     Status: Abnormal   Collection Time: 01/09/23  8:33 PM  Result Value Ref Range   Glucose-Capillary 155 (H) 70 - 99 mg/dL    Comment: Glucose reference range applies only to samples taken after fasting for at least 8 hours.  Protime-INR     Status: Abnormal   Collection Time: 01/10/23  4:52 AM  Result Value Ref Range   Prothrombin Time 30.9 (H) 11.4 - 15.2 seconds   INR 2.9 (H) 0.8 - 1.2    Comment: (NOTE) INR goal varies based on device and disease states. Performed at [facility], [address], [place], [place] [address]   Comprehensive metabolic panel     Status: Abnormal   Collection Time: 01/10/23  4:52 AM  Result Value Ref Range   Sodium 137 135 - 145 mmol/L   Potassium 3.8 3.5 - 5.1 mmol/L   Chloride 108 98 - 111 mmol/L   CO2 21 (L) 22 - 32 mmol/L   Glucose, Bld 147 (H) 70 - 99 mg/dL    Comment: Glucose reference range applies only to samples taken after fasting for at least 8 hours.   BUN 12 8 - 23 mg/dL   Creatinine, Ser 1.91 0.61 - 1.24 mg/dL   Calcium 8.0 (L) 8.9 - 10.3 mg/dL   Total  Protein 4.7 (L) 6.5 - 8.1 g/dL   Albumin 2.4 (L) 3.5 - 5.0 g/dL   AST 31 15 - 41 U/L   ALT 36 0 - 44 U/L   Alkaline Phosphatase 52 38 - 126 U/L   Total Bilirubin 0.7 <1.2 mg/dL   GFR, Estimated >16 >10 mL/min    Comment: (NOTE) Calculated using the CKD-EPI Creatinine Equation (2021)    Anion gap 8 5 - 15    Comment: Performed at [facility], [address], [place], [place] [address]  CBC with Differential/Platelet     Status: Abnormal   Collection Time: 01/10/23  4:52 AM  Result Value Ref Range   WBC 9.1 4.0 - 10.5 K/uL   RBC 2.84 (L) 4.22 - 5.81 MIL/uL   Hemoglobin 8.7 (L) 13.0 - 17.0 g/dL   HCT 40.9 (L) 81.1 - 91.4 %   MCV 92.6 80.0 - 100.0 fL   MCH 30.6 26.0 - 34.0 pg   MCHC 33.1 30.0 - 36.0 g/dL   RDW 78.2 95.6 - 21.3 %   Platelets  276 150 - 400 K/uL   nRBC 0.0 0.0 - 0.2 %   Neutrophils Relative % 69 %   Neutro Abs 6.3 1.7 - 7.7 K/uL   Lymphocytes Relative 14 %   Lymphs Abs 1.3 0.7 - 4.0 K/uL   Monocytes Relative 12 %   Monocytes Absolute 1.1 (H) 0.1 - 1.0 K/uL   Eosinophils Relative 3 %   Eosinophils Absolute 0.3 0.0 - 0.5 K/uL   Basophils Relative 0 %   Basophils Absolute 0.0 0.0 - 0.1 K/uL   Immature Granulocytes 2 %   Abs Immature Granulocytes 0.18 (H) 0.00 - 0.07 K/uL    Comment: Performed at [facility], [address] [place], [place] [address]  Glucose, capillary     Status: Abnormal   Collection Time: 01/10/23  6:19 AM  Result Value Ref Range   Glucose-Capillary 133 (H) 70 - 99 mg/dL    Comment: Glucose reference range applies only to samples taken after fasting for at least 8 hours.   No results found.    Blood pressure (!) 166/86, pulse 79, temperature 98 F (36.7 C), temperature source Oral, resp. rate 17, SpO2 97%.  Medical Problem List and Plan: 1. Functional deficits secondary to debility 2/2 sepsis  -patient may shower  -ELOS/Goals:   2.  Antithrombotics: -DVT/anticoagulation:  Pharmaceutical: Coumadin  -antiplatelet therapy: none  3. Pain Management: Tylenol, oxycodone 5 mg q 4 hrs prn as needed  -gabapentin 600 mg TID  4. Mood/Behavior/Sleep: LCSW to evaluate and provide emotional support  -trazodone 100 mg q HS prn  -antipsychotic agents: n/a  5. Neuropsych/cognition: This patient is not capable of making decisions on his own behalf.  6. Skin/Wound Care: Routine skin care checks   7. Fluids/Electrolytes/Nutrition: Routine Is and Os and follow-up chemistries  -carb modified diet  8: Hypertension: monitor TID and prn. Restart losartan 25mg  daily. Start magnesium gluconate 250mg  HS, klor one time ordered 11/12  -continue Norvasc 5 mg daily  9: Hyperlipidemia: continue statin  10: DM-2 CBGs QID; A1c = 7.6% -continue Semglee 5 units daily -continue  SSI -continue Novolog 3 units TID  11: Trigeminal neuralgia:  -continue Tegretol XR 200 mg QID -continue gabapentin 600 mg TID  -discussed his history of treatment  12: Acute PE: continue Coumadin, dosing per pharmacy consult (INR 2.9 11/11)  13: GERD: continue PPI  14: Anemia: Hgb decreasing, stool occult  ordered, check Hgb tomorrow  I have personally performed a face to face diagnostic evaluation, including, but not limited to relevant history and physical exam findings, of this patient and developed relevant assessment and plan.  Additionally, I have reviewed and concur with the physician assistant's documentation above.  Wendi Maya, PA   Horton Chin, MD 01/10/2023

## 2023-01-10 NOTE — Plan of Care (Signed)
  Problem: RH Problem Solving Goal: LTG Patient will demonstrate problem solving for (SLP) Description: LTG:  Patient will demonstrate problem solving for basic/complex daily situations with cues  (SLP) Flowsheets (Taken 01/10/2023 1529) LTG: Patient will demonstrate problem solving for (SLP): Complex daily situations LTG Patient will demonstrate problem solving for: Supervision   Problem: RH Memory Goal: LTG Patient will use memory compensatory aids to (SLP) Description: LTG:  Patient will use memory compensatory aids to recall biographical/new, daily complex information with cues (SLP) Flowsheets (Taken 01/10/2023 1529) LTG: Patient will use memory compensatory aids to (SLP): Supervision   Problem: RH Awareness Goal: LTG: Patient will demonstrate awareness during functional activites type of (SLP) Description: LTG: Patient will demonstrate awareness during functional activites type of (SLP) Flowsheets (Taken 01/10/2023 1529) Patient will demonstrate during cognitive/linguistic activities awareness type of: Emergent LTG: Patient will demonstrate awareness during cognitive/linguistic activities with assistance of (SLP): Supervision

## 2023-01-10 NOTE — Telephone Encounter (Signed)
Pharmacy Patient Advocate Encounter   Received notification that prior authorization for Savaysa 30MG  tablets is required/requested.   Insurance verification completed.   The patient is insured through [facility] .   Per test claim: PA required; PA started via CoverMyMeds. KEY [id number] . Waiting for clinical questions to populate.

## 2023-01-10 NOTE — Plan of Care (Signed)
  Problem: RH Balance Goal: LTG: Patient will maintain dynamic sitting balance (OT) Description: LTG:  Patient will maintain dynamic sitting balance with assistance during activities of daily living (OT) Flowsheets (Taken 01/10/2023 1152) LTG: Pt will maintain dynamic sitting balance during ADLs with: Independent with assistive device Goal: LTG Patient will maintain dynamic standing with ADLs (OT) Description: LTG:  Patient will maintain dynamic standing balance with assist during activities of daily living (OT)  Flowsheets (Taken 01/10/2023 1152) LTG: Pt will maintain dynamic standing balance during ADLs with: Supervision/Verbal cueing   Problem: Sit to Stand Goal: LTG:  Patient will perform sit to stand in prep for activites of daily living with assistance level (OT) Description: LTG:  Patient will perform sit to stand in prep for activites of daily living with assistance level (OT) Flowsheets (Taken 01/10/2023 1152) LTG: PT will perform sit to stand in prep for activites of daily living with assistance level: Supervision/Verbal cueing   Problem: RH Grooming Goal: LTG Patient will perform grooming w/assist,cues/equip (OT) Description: LTG: Patient will perform grooming with assist, with/without cues using equipment (OT) Flowsheets (Taken 01/10/2023 1152) LTG: Pt will perform grooming with assistance level of: Independent with assistive device    Problem: RH Bathing Goal: LTG Patient will bathe all body parts with assist levels (OT) Description: LTG: Patient will bathe all body parts with assist levels (OT) Flowsheets (Taken 01/10/2023 1152) LTG: Pt will perform bathing with assistance level/cueing: Supervision/Verbal cueing   Problem: RH Dressing Goal: LTG Patient will perform upper body dressing (OT) Description: LTG Patient will perform upper body dressing with assist, with/without cues (OT). Flowsheets (Taken 01/10/2023 1152) LTG: Pt will perform upper body dressing with  assistance level of: Independent with assistive device Goal: LTG Patient will perform lower body dressing w/assist (OT) Description: LTG: Patient will perform lower body dressing with assist, with/without cues in positioning using equipment (OT) Flowsheets (Taken 01/10/2023 1152) LTG: Pt will perform lower body dressing with assistance level of: Supervision/Verbal cueing   Problem: RH Toileting Goal: LTG Patient will perform toileting task (3/3 steps) with assistance level (OT) Description: LTG: Patient will perform toileting task (3/3 steps) with assistance level (OT)  Flowsheets (Taken 01/10/2023 1152) LTG: Pt will perform toileting task (3/3 steps) with assistance level: Independent with assistive device   Problem: RH Toilet Transfers Goal: LTG Patient will perform toilet transfers w/assist (OT) Description: LTG: Patient will perform toilet transfers with assist, with/without cues using equipment (OT) Flowsheets (Taken 01/10/2023 1152) LTG: Pt will perform toilet transfers with assistance level of: Supervision/Verbal cueing   Problem: RH Tub/Shower Transfers Goal: LTG Patient will perform tub/shower transfers w/assist (OT) Description: LTG: Patient will perform tub/shower transfers with assist, with/without cues using equipment (OT) Flowsheets (Taken 01/10/2023 1152) LTG: Pt will perform tub/shower stall transfers with assistance level of: Supervision/Verbal cueing

## 2023-01-10 NOTE — Telephone Encounter (Signed)
 Clinical Questions have been submitted

## 2023-01-10 NOTE — Progress Notes (Signed)
Patient ID: Larry Lane, male   DOB: [birth year], 82 y.o.   MRN: [id number] Met with the patient and wife to review current situation, rehab schedule, Team conference and plan of care. Reviewed numbness in left leg and weakness. Discussed medications for secondary risk management including HTN, HLD, DM (A1C 7.6) and Trigeminal Neuralgia.Reviewed My Chart Access; code provided for accessing data and information regarding plan for discharge/discharge process.  Continue to follow along to address educational needs to facilitate preparation for discharge.Pamelia Hoit

## 2023-01-10 NOTE — Progress Notes (Signed)
Inpatient Rehabilitation  Patient information reviewed and entered into eRehab system by Demarrio Menges Gentry, OTR/L, Rehab Quality Coordinator.   Information including medical coding, functional ability and quality indicators will be reviewed and updated through discharge.   

## 2023-01-10 NOTE — Evaluation (Signed)
Physical Therapy Assessment and Plan  Patient Details  Name: Larry Lane MRN: [id number] Date of Birth: 01-Dec-1940  PT Diagnosis: Abnormality of gait, Coordination disorder, Difficulty walking, and Muscle weakness Rehab Potential: Good ELOS: 8-10 days   Today's Date: 01/10/2023 PT Individual Time: 0865-7846 PT Individual Time Calculation (min): 72 min    Hospital Problem: Principal Problem:   Sepsis (HCC)   Past Medical History:  Past Medical History:  Diagnosis Date   Abscess of back    Arthritis    Bacterial infection 2015   Cancer (HCC)    skin    Diabetes mellitus without complication (HCC)    Dysrhythmia    Edema    RIGHT ANKLE   GERD (gastroesophageal reflux disease)    Hypertension    Trigeminal nerve disorder    Trigeminal neuralgia    Past Surgical History:  Past Surgical History:  Procedure Laterality Date   APPENDECTOMY  2010   CATARACT EXTRACTION W/PHACO Right 01/26/2017   Procedure: CATARACT EXTRACTION PHACO AND INTRAOCULAR LENS PLACEMENT (IOC);  Surgeon: Nevada Crane, MD;  Location: [facility] ORS;  Service: Ophthalmology;  Laterality: Right;  Lot # [id number] H [place]: 00:48.2 AP%: 9.9 CDE: 5.35   I&D of back abscess     TRIGEMINAL NERVE DECOMPRESSION      Assessment & Plan Clinical Impression: Patient is a 82 year old male brought in by EMS to [facility] emergency department on 01/04/2023. According to the patient's wife, the patient had not been feeling well for approximately 1 week. He had complaints of stomach cramping, bilateral leg cramping and to presentation, shaking chill with altered mental status. This prompted her to call 911. Patient was tachycardic with SaO2 of 89% on arrival. Rate fever. Chest x-ray was negative for pneumonia. CT head negative for acute abnormality. Troponins were mildly elevated. CTA PE study was obtained and significant for possible PE with possible heart strain and no evidence of pneumonia. Heparin infusion started. He  developed worsening fever to 103. Urgent lumbar puncture performed by interventional radiology and empiric coverage with multiple antimicrobials and acyclovir were started. He was admitted to the ICU and critical care medicine consulted. Required vasopressor support. Has a history of trigeminal neuralgia on gabapentin and Tegretol. These were held initially. The left acute kidney injury with creatinine 1.69 and BUN of 26. High-sensitivity troponin 2150 on 11/07. Norepinephrine discontinued 11/07. TTE showed RV dysfunction consistent with McConnell sign, LV is hyperdynamic. Transferred to medicine service with telemetry on 11-08. Films of left knee performed unrevealing of infectious source. CSF culture no growth at 2 days. Continued on ceftriaxone 2 g IV every 24 hours and metronidazole 500 mg every 12 hours. Serum creatinine has trended down and now normal. Unable to transition to DOAC secondary to interactions with carbamazepine. He was started on warfarin on 11/09 with a goal INR of 2-3. Status now back to baseline. He has a history of type 2 diabetes mellitus and on sliding scale insulin, meal coverage and long-acting insulin. His home amlodipine has been restarted and losartan held due to kidney function. This time Crestor restarted. Received last dose of metronidazole this evening, 11/11. Was ambulated in the hallway with mobility specialist with contact-guard assist and rolling walker. Remittent left knee buckling.The patient requires inpatient medicine and rehabilitation evaluations and services for ongoing dysfunction secondary to septic shock, PE, debility.   Patient currently requires min with mobility secondary to muscle weakness, decreased cardiorespiratoy endurance, impaired timing and sequencing and unbalanced muscle activation, and decreased standing  balance, decreased postural control, and decreased balance strategies.  Prior to hospitalization, patient was independent  with mobility and lived  with Spouse in a House home.  Home access is 2 steps into home from garage with a cabinet on the L to reachStairs to enter.  Patient will benefit from skilled PT intervention to maximize safe functional mobility, minimize fall risk, and decrease caregiver burden for planned discharge home with 24 hour supervision.  Anticipate patient will benefit from follow up OP at discharge.  PT - End of Session Activity Tolerance: Tolerates 30+ min activity without fatigue Endurance Deficit: Yes Endurance Deficit Description: rest breaks with mobility PT Assessment Rehab Potential (ACUTE/IP ONLY): Good PT Barriers to Discharge: Inaccessible home environment;Home environment access/layout PT Barriers to Discharge Comments: 3 STE with BHRs, 2 STE with no HRs PT Patient demonstrates impairments in the following area(s): Balance;Endurance;Safety;Motor;Pain;Sensory PT Transfers Functional Problem(s): Bed Mobility;Bed to Chair;Car PT Locomotion Functional Problem(s): Ambulation;Stairs PT Plan PT Intensity: Minimum of 1-2 x/day ,45 to 90 minutes PT Frequency: 5 out of 7 days PT Duration Estimated Length of Stay: 8-10 days PT Treatment/Interventions: Ambulation/gait training;Balance/vestibular training;Cognitive remediation/compensation;Pain management;Stair training;Therapeutic Activities;Patient/family education;DME/adaptive equipment instruction;Therapeutic Exercise;Functional mobility training;UE/LE Strength taining/ROM;Discharge planning;Neuromuscular re-education;Splinting/orthotics;UE/LE Coordination activities PT Transfers Anticipated Outcome(s): supervision PT Locomotion Anticipated Outcome(s): supervision ambulatory PT Recommendation Recommendations for Other Services: None Follow Up Recommendations: Outpatient PT Patient destination: Home Equipment Recommended: To be determined Equipment Details: pt owns RW   PT Evaluation Precautions/Restrictions Precautions Precautions:  Fall Restrictions Weight Bearing Restrictions: No Pain Interference Pain Interference Pain Effect on Sleep: 3. Frequently Pain Interference with Therapy Activities: 3. Frequently Pain Interference with Day-to-Day Activities: 3. Frequently Home Living/Prior Functioning Home Living Available Help at Discharge: Family;Available 24 hours/day Type of Home: House Home Access: Stairs to enter [employer] of Steps: 2 steps into home from garage with a cabinet on the L to reach Entrance Stairs-Rails: Right;Left Home Layout: One level;Other (Comment) (sunken living room) Bathroom Shower/Tub: Walk-in shower;Tub/shower unit Bathroom Toilet: Standard Bathroom Accessibility: Yes Additional Comments: wife reports they have a plastic shower stool pt used in past but interested in shower seat  Lives With: Spouse Prior Function Level of Independence: Independent with basic ADLs;Independent with homemaking with ambulation;Independent with gait  Able to Take Stairs?: Yes Driving: Yes Vocation: Retired Gaffer: retired Database administrator Overall Cognitive Status: Impaired/Different from baseline Arousal/Alertness: Awake/alert Orientation Level: Oriented X4 Year: 2024 Month: November Day of Week: Incorrect Attention: Focused;Selective Focused Attention: Appears intact Selective Attention: Impaired Selective Attention Impairment: Verbal complex;Functional complex Memory: Impaired Memory Impairment: Decreased short term memory;Retrieval deficit Decreased Short Term Memory: Verbal basic;Functional basic Awareness: Impaired Awareness Impairment: Intellectual impairment Problem Solving: Appears intact Safety/Judgment: Appears intact Sensation Sensation Light Touch: Impaired by gross assessment (decreased light touch sensation to R quad) Hot/Cold: Appears Intact Proprioception: Appears Intact Stereognosis: Not tested Coordination Gross Motor Movements are Fluid and  Coordinated: No Fine Motor Movements are Fluid and Coordinated: No Coordination and Movement Description: premorbid B hand tremor pt reports and demonstrated L LE weakness imacts motor coord Finger Nose Finger Test: slowed with mild tremor Bly 9 Hole Peg Test: L 29 sec, R 34 sec Motor  Motor Motor: Other (comment) Motor - Skilled Clinical Observations: generalized weakness, unbalanced quad activation demonstrates buckling with LLE in stance phase during functional upright mobility  Trunk/Postural Assessment  Cervical Assessment Cervical Assessment: Within Functional Limits Thoracic Assessment Thoracic Assessment: Within Functional Limits Lumbar Assessment Lumbar Assessment: Within Functional Limits Postural Control Postural Control: Deficits on evaluation  Righting Reactions: delayed and inadequate Protective Responses: decreased Postural Limitations: decreased  Balance Balance Balance Assessed: Yes Standardized Balance Assessment Standardized Balance Assessment: Timed Up and Go Test Timed Up and Go Test TUG: Normal TUG Normal TUG (seconds): 63 Static Standing Balance Static Standing - Balance Support: Bilateral upper extremity supported Static Standing - Level of Assistance: 4: Min assist (CGA) Dynamic Standing Balance Dynamic Standing - Balance Support: Bilateral upper extremity supported Dynamic Standing - Level of Assistance: 4: Min assist Extremity Assessment  RLE Assessment General Strength Comments: grossly 4+/5 LLE Assessment LLE Assessment: Exceptions to [facility] LLE Strength Left Hip Flexion: 1/5 Left Knee Flexion: 4/5 Left Knee Extension: 1/5 Left Ankle Dorsiflexion: 4/5 Left Ankle Plantar Flexion: 3/5 (tested in sitting against manual resistance)  Care Tool Care Tool Bed Mobility Roll left and right activity   Roll left and right assist level: Supervision/Verbal cueing    Sit to lying activity   Sit to lying assist level: Contact Guard/Touching assist     Lying to sitting on side of bed activity   Lying to sitting on side of bed assist level: the ability to move from lying on the back to sitting on the side of the bed with no back support.: Minimal Assistance - Patient > 75%     Care Tool Transfers Sit to stand transfer   Sit to stand assist level: Minimal Assistance - Patient > 75%    Chair/bed transfer   Chair/bed transfer assist level: Minimal Assistance - Patient > 75%    Car transfer   Car transfer assist level: Minimal Assistance - Patient > 75%      Care Tool Locomotion Ambulation   Assist level: Minimal Assistance - Patient > 75%   Max distance: 133'  Walk 10 feet activity   Assist level: Minimal Assistance - Patient > 75% Assistive device: Walker-rolling   Walk 50 feet with 2 turns activity   Assist level: Minimal Assistance - Patient > 75% Assistive device: Walker-rolling  Walk 150 feet activity Walk 150 feet activity did not occur: Safety/medical concerns      Walk 10 feet on uneven surfaces activity Walk 10 feet on uneven surfaces activity did not occur: Safety/medical concerns      Stairs   Assist level: Moderate Assistance - Patient - 50 - 74% Stairs assistive device: 2 hand rails Max number of stairs: 1  Walk up/down 1 step activity   Walk up/down 1 step (curb) assist level: Moderate Assistance - Patient - 50 - 74% Walk up/down 1 step or curb assistive device: 2 hand rails  Walk up/down 4 steps activity Walk up/down 4 steps activity did not occur: Safety/medical concerns      Walk up/down 12 steps activity Walk up/down 12 steps activity did not occur: Safety/medical concerns      Pick up small objects from floor Pick up small object from the floor (from standing position) activity did not occur: Safety/medical concerns      Wheelchair Is the patient using a wheelchair?: Yes Type of Wheelchair: Manual   Wheelchair assist level: Dependent - Patient 0%    Wheel 50 feet with 2 turns activity   Assist  Level: Dependent - Patient 0%  Wheel 150 feet activity   Assist Level: Dependent - Patient 0%    Refer to Care Plan for Long Term Goals  SHORT TERM GOAL WEEK 1 PT Short Term Goal 1 (Week 1): STG = LTG due to ELOS  Recommendations for other services: None  Skilled Therapeutic Intervention Evaluation completed (see details above and below) with education on PT POC and goals and individual treatment initiated with focus on gait training and assessment of falls risk. Pt denies pain at start of session but reports pain with active and passive ROM of LLE, demonstrates trace quadriceps activation and muscle guarding however unable to move through ROM. Pt completes transfers with RW min assist with cues for hand placement. Pt completes ambulation 68' and 133' with RW CGA/min assist with intermittent L knee buckle, inconsistent. Pt completes up/down bottom step with BHRs x5 reps, significant buckle on 3rd rep requiring mod assist to regain balance despite ascending with RLE descending with LLE. Pt completes car transfer with min assist for managing LLE into/out of car. Pt completes TUG in average 63 seconds with trials noted below: Trial 1: 60 seconds Trial 2: 61 seconds Trial 3 68 seconds Pt returns to room and remains seated in [facility] with all needs within reach, cal light in place and chair alarm donned and activated at end of session.   Mobility Transfers Transfers: Stand to Sit;Sit to Stand;Stand Pivot Transfers Sit to Stand: Minimal Assistance - Patient > 75% Stand to Sit: Minimal Assistance - Patient > 75% Stand Pivot Transfers: Minimal Assistance - Patient > 75% Stand Pivot Transfer Details: Verbal cues for precautions/safety;Verbal cues for technique;Verbal cues for sequencing;Verbal cues for safe use of DME/AE Transfer (Assistive device): Rolling walker Locomotion  Gait Ambulation: Yes Gait Assistance: Minimal Assistance - Patient > 75% Gait Distance (Feet): 133 Feet Assistive device:  Rolling walker Gait Assistance Details: Verbal cues for precautions/safety;Verbal cues for gait pattern;Verbal cues for safe use of DME/AE Gait Assistance Details: cues for stepping into RW with LLE due to L knee buckle Gait Gait: Yes Gait Pattern: Impaired Gait Pattern: Decreased stance time - left (occasional L knee buckle) Gait velocity: decreased Stairs / Additional Locomotion Stairs: Yes Stairs Assistance: Moderate Assistance - Patient 50 - 74% Stair Management Technique: Two rails Number of Stairs: 1 Height of Stairs: 3 Wheelchair Mobility Wheelchair Mobility: No   Discharge Criteria: Patient will be discharged from PT if patient refuses treatment 3 consecutive times without medical reason, if treatment goals not met, if there is a change in medical status, if patient makes no progress towards goals or if patient is discharged from hospital.  The above assessment, treatment plan, treatment alternatives and goals were discussed and mutually agreed upon: by patient  Edwin Cap PT, DPT 01/10/2023, 3:48 PM

## 2023-01-10 NOTE — Evaluation (Signed)
Occupational Therapy Assessment and Plan  Patient Details  Name: Larry Lane MRN: [id number] Date of Birth: September 27, 1940  OT Diagnosis: cognitive deficits and muscle weakness (generalized) Rehab Potential: Rehab Potential (ACUTE ONLY): Good ELOS: 7-10 d   Today's Date: 01/10/2023 OT Individual Time: 7829-5621 OT Individual Time Calculation (min): 60 min     Hospital Problem: Principal Problem:   Sepsis (HCC)   Past Medical History:  Past Medical History:  Diagnosis Date   Abscess of back    Arthritis    Bacterial infection 2015   Cancer (HCC)    skin    Diabetes mellitus without complication (HCC)    Dysrhythmia    Edema    RIGHT ANKLE   GERD (gastroesophageal reflux disease)    Hypertension    Trigeminal nerve disorder    Trigeminal neuralgia    Past Surgical History:  Past Surgical History:  Procedure Laterality Date   APPENDECTOMY  2010   CATARACT EXTRACTION W/PHACO Right 01/26/2017   Procedure: CATARACT EXTRACTION PHACO AND INTRAOCULAR LENS PLACEMENT (IOC);  Surgeon: Nevada Crane, MD;  Location: [facility] ORS;  Service: Ophthalmology;  Laterality: Right;  Lot # [id number] H [place]: 00:48.2 AP%: 9.9 CDE: 5.35   I&D of back abscess     TRIGEMINAL NERVE DECOMPRESSION      Assessment & Plan Clinical Impression: Alcindor Eliassen is a 82 year old male brought in by EMS to [facility] emergency department on 01/04/2023. According to the patient's wife, the patient had not been feeling well for approximately 1 week. He had complaints of stomach cramping, bilateral leg cramping and to presentation, shaking chill with altered mental status. This prompted her to call 911. Patient was tachycardic with SaO2 of 89% on arrival. Rate fever. Chest x-ray was negative for pneumonia. CT head negative for acute abnormality. Troponins were mildly elevated. CTA PE study was obtained and significant for possible PE with possible heart strain and no evidence of pneumonia. Heparin infusion  started. He developed worsening fever to 103. Urgent lumbar puncture performed by interventional radiology and empiric coverage with multiple antimicrobials and acyclovir were started. He was admitted to the ICU and critical care medicine consulted. Required vasopressor support. Has a history of trigeminal neuralgia on gabapentin and Tegretol. These were held initially. The left acute kidney injury with creatinine 1.69 and BUN of 26. High-sensitivity troponin 2150 on 11/07. Norepinephrine discontinued 11/07. TTE showed RV dysfunction consistent with McConnell sign, LV is hyperdynamic. Transferred to medicine service with telemetry on 11-08. Films of left knee performed unrevealing of infectious source. CSF culture no growth at 2 days. Continued on ceftriaxone 2 g IV every 24 hours and metronidazole 500 mg every 12 hours. Serum creatinine has trended down and now normal. Unable to transition to DOAC secondary to interactions with carbamazepine. He was started on warfarin on 11/09 with a goal INR of 2-3. Status now back to baseline. He has a history of type 2 diabetes mellitus and on sliding scale insulin, meal coverage and long-acting insulin. His home amlodipine has been restarted and losartan held due to kidney function. This time Crestor restarted. Received last dose of metronidazole this evening, 11/11. Was ambulated in the hallway with mobility specialist with contact-guard assist and rolling walker. Remittent left knee buckling.The patient requires inpatient medicine and rehabilitation evaluations and services for ongoing dysfunction secondary to septic shock, PE, debility.   Patient currently requires mod with basic self-care skills and IADL secondary to muscle weakness, decreased cardiorespiratoy endurance, impaired timing and sequencing, decreased  coordination, and decreased motor planning, and decreased sitting balance, decreased standing balance, decreased postural control, and decreased balance  strategies.  Prior to hospitalization, patient could complete all aspects of ADL's and mobility including driving with independent  level.   Patient will benefit from skilled intervention to decrease level of assist with basic self-care skills, increase independence with basic self-care skills, and increase level of independence with iADL prior to discharge home with care partner.  Anticipate patient will require 24 hour supervision and follow up home health.  OT Evaluation Precautions/Restrictions  Precautions Precautions: Fall Restrictions Weight Bearing Restrictions: No General Chart Reviewed: Yes Family/Caregiver Present: Yes  Pain Pain Assessment Pain Scale: 0-10 Pain Score: 8  Pain Type: Chronic pain Pain Location: Face Pain Orientation: Right Pain Descriptors / Indicators: Burning Pain Onset: Progressive Patients Stated Pain Goal: 1 Pain Intervention(s): Distraction Home Living/Prior Functioning Home Living Family/patient expects to be discharged to:: Private residence Living Arrangements: Spouse/significant other Available Help at Discharge: Family, Available 24 hours/day Type of Home: House Home Access: Stairs to enter [employer] of Steps: 2 steps into home from garage with a cabinet on the L to reach Entrance Stairs-Rails: Right, Left Home Layout: One level, Other (Comment) (sunken living room) Bathroom Shower/Tub: Psychologist, counselling, Engineer, manufacturing systems: Standard Bathroom Accessibility: Yes Additional Comments: wife reports they have a plastic shower stool pt used in past but interested in shower seat  Lives With: Spouse IADL History Current License: Yes Mode of Transportation: Set designer Occupation: Retired Type of Occupation: Visual merchandiser Leisure and Hobbies: caring for dtrs 3 dogs Prior Function Level of Independence: Independent with basic ADLs, Independent with homemaking with ambulation, Independent with gait  Able to Take Stairs?: Yes Driving:  Yes Vocation: Retired Gaffer: retired Runner, broadcasting/film/video Baseline Vision/History: 1 Wears glasses Ability to See in Adequate Light: 0 Adequate Patient Visual Report: No change from baseline Perception  Perception: Within Functional Limits Praxis Praxis: WFL Cognition Cognition Overall Cognitive Status: Impaired/Different from baseline Arousal/Alertness: Awake/alert Orientation Level: Person;Place;Situation Memory: Impaired Memory Impairment: Decreased short term memory;Retrieval deficit Decreased Short Term Memory: Verbal basic;Functional basic Attention: Focused;Selective Focused Attention: Appears intact Selective Attention: Impaired Selective Attention Impairment: Verbal complex;Functional complex Awareness: Impaired Awareness Impairment: Intellectual impairment Problem Solving: Appears intact Safety/Judgment: Appears intact Brief Interview for Mental Status (BIMS) Repetition of Three Words (First Attempt): 3 Temporal Orientation: Year: Correct Temporal Orientation: Month: Accurate within 5 days Temporal Orientation: Day: Correct Recall: "Sock": Yes, no cue required Recall: "Blue": Yes, no cue required Recall: "Bed": Yes, after cueing ("a piece of furniture") BIMS Summary Score: 14 Sensation Sensation Light Touch: Impaired by gross assessment (decreased light touch sensation to R quad) Hot/Cold: Appears Intact Proprioception: Appears Intact Stereognosis: Not tested Coordination Gross Motor Movements are Fluid and Coordinated: No Fine Motor Movements are Fluid and Coordinated: No Coordination and Movement Description: premorbid B hand tremor pt reports and demonstrated L LE weakness imacts motor coord Finger Nose Finger Test: slowed with mild tremor Bly 9 Hole Peg Test: L 29 sec, R 34 sec Motor  Motor Motor: Other (comment) Motor - Skilled Clinical Observations: generalized weakness, unbalanced quad activation demonstrates buckling with LLE in stance  phase during functional upright mobility  Trunk/Postural Assessment  Cervical Assessment Cervical Assessment: Within Functional Limits Thoracic Assessment Thoracic Assessment: Within Functional Limits Lumbar Assessment Lumbar Assessment: Within Functional Limits Postural Control Postural Control: Deficits on evaluation Righting Reactions: delayed and inadequate Protective Responses: decreased Postural Limitations: decreased  Balance Balance Balance Assessed: Yes Standardized Balance Assessment Standardized  Balance Assessment: Timed Up and Go Test Timed Up and Go Test TUG: Normal TUG Normal TUG (seconds): 63 Dynamic Sitting Balance Sitting balance - Comments: sitting EOB Static Standing Balance Static Standing - Balance Support: Bilateral upper extremity supported Static Standing - Level of Assistance: 4: Min assist Dynamic Standing Balance Dynamic Standing - Balance Support: Bilateral upper extremity supported Dynamic Standing - Level of Assistance: 4: Min assist Extremity/Trunk Assessment RUE Assessment RUE Assessment: Exceptions to [facility] General Strength Comments: 4-/5 grip 40 lbs LUE Assessment LUE Assessment: Exceptions to [facility] LUE Strength LUE Overall Strength Comments: 4-/5, grip 55 lbs  Care Tool Care Tool Self Care Eating   Eating Assist Level: Set up assist    Oral Care    Oral Care Assist Level: Supervision/Verbal cueing    Bathing   Body parts bathed by patient: Face;Right upper leg;Left upper leg;Right arm;Left arm;Chest;Abdomen;Front perineal area Body parts bathed by helper: Right lower leg;Left lower leg;Buttocks   Assist Level: Moderate Assistance - Patient 50 - 74%    Upper Body Dressing(including orthotics)   What is the patient wearing?: Pull over shirt   Assist Level: Supervision/Verbal cueing    Lower Body Dressing (excluding footwear)   What is the patient wearing?: Underwear/pull up;Pants Assist for lower body dressing: Moderate  Assistance - Patient 50 - 74%    Putting on/Taking off footwear   What is the patient wearing?: Non-skid slipper socks Assist for footwear: Maximal Assistance - Patient 25 - 49%       Care Tool Toileting Toileting activity   Assist for toileting: Minimal Assistance - Patient > 75%     Care Tool Bed Mobility Roll left and right activity   Roll left and right assist level: Supervision/Verbal cueing    Sit to lying activity   Sit to lying assist level: Contact Guard/Touching assist    Lying to sitting on side of bed activity   Lying to sitting on side of bed assist level: the ability to move from lying on the back to sitting on the side of the bed with no back support.: Minimal Assistance - Patient > 75%     Care Tool Transfers Sit to stand transfer   Sit to stand assist level: Minimal Assistance - Patient > 75%    Chair/bed transfer   Chair/bed transfer assist level: Minimal Assistance - Patient > 75%     Toilet transfer   Assist Level: Minimal Assistance - Patient > 75%     Care Tool Cognition  Expression of Ideas and Wants Expression of Ideas and Wants: 4. Without difficulty (complex and basic) - expresses complex messages without difficulty and with speech that is clear and easy to understand  Understanding Verbal and Non-Verbal Content Understanding Verbal and Non-Verbal Content: 3. Usually understands - understands most conversations, but misses some part/intent of message. Requires cues at times to understand   Memory/Recall Ability Memory/Recall Ability : Current season;That he or she is in a hospital/hospital unit   Refer to Care Plan for Long Term Goals  SHORT TERM GOAL WEEK 1 OT Short Term Goal 1 (Week 1): STG=LTG based on LOS  Recommendations for other services: Neuropsych and Therapeutic Recreation  Pet therapy, Kitchen group, Stress management, and Outing/community reintegration   Skilled Therapeutic Intervention ADL ADL Eating: Set up Where  Assessed-Eating: Wheelchair Grooming: Setup Where Assessed-Grooming: Sitting at sink;Wheelchair Upper Body Bathing: Setup Where Assessed-Upper Body Bathing: Wheelchair Lower Body Bathing: Moderate assistance Where Assessed-Lower Body Bathing: Sitting at sink;Standing at sink Upper  Body Dressing: Setup Where Assessed-Upper Body Dressing: Sitting at sink Lower Body Dressing: Moderate assistance Where Assessed-Lower Body Dressing: Sitting at sink;Standing at sink;Wheelchair Toileting: Minimal assistance Where Assessed-Toileting: Bedside Commode;Toilet;Bed level Toilet Transfer: Minimal assistance Toilet Transfer Method: Stand pivot Toilet Transfer Equipment: Bedside commode;Grab bars Tub/Shower Transfer: Not assessed Film/video editor: Minimal assistance Film/video editor Method: Warden/ranger: Shower seat with back ADL Comments: Pt mod A LB self care due to L Le weakness, needs min a for SPT with RW to access toilet, w/c and stall shower with DME and began instruction with reacher for LB simple dressing Mobility  Bed Mobility Bed Mobility: Rolling Right;Rolling Left;Left Sidelying to Sit;Supine to Sit Rolling Right: Supervision/verbal cueing Rolling Left: Supervision/Verbal cueing Left Sidelying to Sit: Contact Guard/Touching assist Supine to Sit: Contact Guard/Touching assist Transfers Sit to Stand: Minimal Assistance - Patient > 75% Stand to Sit: Minimal Assistance - Patient > 75%  OT Intervention/Training:   Pt seen for full initial OT evaluation and training session this am. Pt in bed upon OT arrival with wife present bedside.  OT introduced role of therapy and purpose of session. Pt  open to all presented assessment and training this visit. OT assisted and assessed ADL's, mobility, vision, sensation. cognition/lang, G/FMC, strength and balance throughout session. See above for levels. Pt will benefit from skilled OT services at CIR to maximize  function and safety with recommendation to return home with mod I for UB BADL's and S for LB and higher level mobility and activity with HHOT services upon d/c home. Pt left at end of session in w/c with  chair alarm set, tray table and nurse call bell within reach.     Discharge Criteria: Patient will be discharged from OT if patient refuses treatment 3 consecutive times without medical reason, if treatment goals not met, if there is a change in medical status, if patient makes no progress towards goals or if patient is discharged from hospital.  The above assessment, treatment plan, treatment alternatives and goals were discussed and mutually agreed upon: by patient and by family  Vicenta Dunning 01/10/2023, 4:00 PM

## 2023-01-10 NOTE — Progress Notes (Signed)
PHARMACY - ANTICOAGULATION CONSULT NOTE  Pharmacy Consult for Warfarin Indication:  rule out pulmonary embolus  No Known Allergies  Patient Measurements:    Vital Signs: Temp: 98 F (36.7 C) (11/12 0446) Temp Source: Oral (11/12 0446) BP: 166/86 (11/12 0446) Pulse Rate: 79 (11/12 0446)  Labs: Recent Labs    01/08/23 0819 01/09/23 0633 01/10/23 0452  HGB 10.1* 9.5* 8.7*  HCT 30.4* 28.0* 26.3*  PLT 209 232 276  LABPROT 18.9* 30.5* 30.9*  INR 1.6* 2.9* 2.9*  HEPARINUNFRC 0.41 0.44  --   CREATININE 0.87  --  0.84    Estimated Creatinine Clearance: 72.8 mL/min (by C-G formula based on SCr of 0.84 mg/dL).   Assessment:  82 y/o M with CT angiogram chest with left upper lobe/left lower lobe pulmonary emboli w/RHS. Not on anticoagulation prior to inpatient admission. Transitioned from heparin (stopped 11/11). Pharmacy consulted to manage warfarin.    Significant DDI with warfarin: carbamazepine (decreases INR), metronidazole 11/7>>11/12 (increases INR)  No dose 11/11 d/t jump in INR after only 2 doses. Hgb/Plt stable. INR remains therapeutic at 2.9 today. PO intake documented 75-100%.   Goal of Therapy:  INR: 2-3 Monitor platelets by anticoagulation protocol: Yes   Plan:  Give warfarin 0.5 mg po x 1 Monitor daily INR, CBC, clinical course, s/sx of bleed, PO intake/diet, Drug-Drug Interactions   Thank you for allowing pharmacy to be a part of this patient's care.   Signe Colt, PharmD 01/10/2023 9:47 AM  **Pharmacist phone directory can be found on [email or web address] listed under [facility]**

## 2023-01-11 DIAGNOSIS — G5721 Lesion of femoral nerve, right lower limb: Secondary | ICD-10-CM

## 2023-01-11 DIAGNOSIS — A419 Sepsis, unspecified organism: Secondary | ICD-10-CM | POA: Diagnosis not present

## 2023-01-11 DIAGNOSIS — N179 Acute kidney failure, unspecified: Secondary | ICD-10-CM

## 2023-01-11 DIAGNOSIS — R6520 Severe sepsis without septic shock: Secondary | ICD-10-CM

## 2023-01-11 LAB — CBC
HCT: 27.2 % — ABNORMAL LOW (ref 39.0–52.0)
Hemoglobin: 9.1 g/dL — ABNORMAL LOW (ref 13.0–17.0)
MCH: 31.2 pg (ref 26.0–34.0)
MCHC: 33.5 g/dL (ref 30.0–36.0)
MCV: 93.2 fL (ref 80.0–100.0)
Platelets: 369 10*3/uL (ref 150–400)
RBC: 2.92 MIL/uL — ABNORMAL LOW (ref 4.22–5.81)
RDW: 12.4 % (ref 11.5–15.5)
WBC: 9.7 10*3/uL (ref 4.0–10.5)
nRBC: 0 % (ref 0.0–0.2)

## 2023-01-11 LAB — GLUCOSE, CAPILLARY
Glucose-Capillary: 170 mg/dL — ABNORMAL HIGH (ref 70–99)
Glucose-Capillary: 168 mg/dL — ABNORMAL HIGH (ref 70–99)
Glucose-Capillary: 202 mg/dL — ABNORMAL HIGH (ref 70–99)
Glucose-Capillary: 155 mg/dL — ABNORMAL HIGH (ref 70–99)

## 2023-01-11 LAB — PROTIME-INR
INR: 2 — ABNORMAL HIGH (ref 0.8–1.2)
Prothrombin Time: 22.9 s — ABNORMAL HIGH (ref 11.4–15.2)

## 2023-01-11 LAB — OCCULT BLOOD X 1 CARD TO LAB, STOOL: Fecal Occult Bld: POSITIVE — AB

## 2023-01-11 MED ORDER — METFORMIN HCL ER 500 MG PO TB24
500.0000 mg | ORAL_TABLET | Freq: Every day | ORAL | Status: DC
  Filled 2023-01-11: qty 1

## 2023-01-11 MED ORDER — MAGNESIUM GLUCONATE 500 MG PO TABS
500.0000 mg | ORAL_TABLET | Freq: Every day | ORAL | Status: DC
  Administered 2023-01-11 – 2023-01-16 (×6): 500 mg via ORAL
  Filled 2023-01-11 (×6): qty 1

## 2023-01-11 MED ORDER — WARFARIN SODIUM 2.5 MG PO TABS
2.5000 mg | ORAL_TABLET | Freq: Once | ORAL | Status: AC
  Administered 2023-01-11: 2.5 mg via ORAL
  Filled 2023-01-11: qty 1

## 2023-01-11 NOTE — Progress Notes (Signed)
Physical Therapy Session Note  Patient Details  Name: Larry Lane MRN: [id number] Date of Birth: 12-06-1940  Today's Date: 01/11/2023 PT Individual Time: 0800-0915 PT Individual Time Calculation (min): 75 min   Short Term Goals: Week 1:  PT Short Term Goal 1 (Week 1): STG = LTG due to ELOS  Skilled Therapeutic Interventions/Progress Updates:      Pt sitting in wheelchair with wife present. Patient reports no pain. Assisted with donning pants with modA for threading LLE and pulling over hips in standing. Sit<>stands to RW with CGA from w/c height with cues for hand placement.   Transported to main rehab gym for time management.   Gait training 124ft with CGA and RW - relies heavily on UE support through RW, had x1 instance of L knee buckling towards end of gait. Gait speed 0.29 m/s, indicative of increased falls risk and household ambulator.   Assisted into supine position on mat table with minA for LLE management. Pt positioned in hooklying with bolster under his knees. Applied NMES to L quad using preset settings for Large Muscle Atrophy. With intensity at , unable to produce any response nor muscle twitching. Switched pads to his R quad to assess response, able to get some muscle twitching but no active movement of the lower leg.   Pt instructed in stair training using 6" steps and 2 hand rails. Pt reports he doesn't have bilateral hand rails for home entrance - recommend either B hand rails or ramped entrance due to LLE weakness. Pt navigated up/down x4 6" steps - patient's L knee buckling significantly with the first step needing maxA for recovery to prevent falling.   Pt assisted onto the Kinetron with a minA stand pivot transfer. He completed 8 minutes at L30cm/sec and able to get near full ROM on his L for AROM.   Pt returned to his room and ended treatment sitting in w/c with wife present and updated on pt's tolerance to mobility. Reviewed recommendation for ramp install  before DC - both understanding and agreeable.     Therapy Documentation Precautions:  Precautions Precautions: Fall Precaution Comments: L knee buckle Restrictions Weight Bearing Restrictions: No General:     Therapy/Group: Individual Therapy  Orrin Brigham 01/11/2023, 7:43 AM

## 2023-01-11 NOTE — Discharge Instructions (Addendum)
Inpatient Rehab Discharge Instructions  Larry Lane Discharge date and time:  01/17/2023  Activities/Precautions/ Functional Status: Activity: no lifting, driving, or strenuous exercise until cleared by MD Diet: cardiac diet Wound Care: none needed Functional status:  ___ No restrictions     ___ Walk up steps independently __x_ 24/7 supervision/assistance   ___ Walk up steps with assistance ___ Intermittent supervision/assistance  ___ Bathe/dress independently ___ Walk with walker     ___ Bathe/dress with assistance ___ Walk Independently    ___ Shower independently ___ Walk with assistance    __x_ Shower with assistance _x__ No alcohol     ___ Return to work/school ________  Special Instructions: No driving, alcohol consumption or tobacco use.   COMMUNITY REFERRALS UPON DISCHARGE:   HOME EXERCISE PROGRAM GIVEN TO PATIENT WHICH HE PREFERS  Medical Equipment/Items Ordered: PATIENT HAS EQUIPMENT FROM PAST ADMISSIONS                                                 Agency/Supplier: NA   My questions have been answered and I understand these instructions. I will adhere to these goals and the provided educational materials after my discharge from the hospital.  Patient/Caregiver Signature _______________________________ Date __________  Clinician Signature _______________________________________ Date __________  Please bring this form and your medication list with you to all your follow-up doctor's appointments.

## 2023-01-11 NOTE — Evaluation (Signed)
Recreational Therapy Assessment and Plan  Patient Details  Name: Larry Lane MRN: [id number] Date of Birth: Feb 10, 1941 Today's Date: 01/11/2023  Rehab Potential:  Good ELOS:   d/c 11/19  Assessment Hospital Problem: Principal Problem:   Sepsis (HCC)     Past Medical History:      Past Medical History:  Diagnosis Date   Abscess of back     Arthritis     Bacterial infection 2015   Cancer (HCC)      skin    Diabetes mellitus without complication (HCC)     Dysrhythmia     Edema      RIGHT ANKLE   GERD (gastroesophageal reflux disease)     Hypertension     Trigeminal nerve disorder     Trigeminal neuralgia          Past Surgical History:       Past Surgical History:  Procedure Laterality Date   APPENDECTOMY   2010   CATARACT EXTRACTION W/PHACO Right 01/26/2017    Procedure: CATARACT EXTRACTION PHACO AND INTRAOCULAR LENS PLACEMENT (IOC);  Surgeon: Nevada Crane, MD;  Location: [facility] ORS;  Service: Ophthalmology;  Laterality: Right;  Lot # [id number] H [place]: 00:48.2 AP%: 9.9 CDE: 5.35   I&D of back abscess       TRIGEMINAL NERVE DECOMPRESSION              Assessment & Plan Clinical Impression: Patient is a 82 year old male brought in by EMS to [facility] emergency department on 01/04/2023. According to the patient's wife, the patient had not been feeling well for approximately 1 week. He had complaints of stomach cramping, bilateral leg cramping and to presentation, shaking chill with altered mental status. This prompted her to call 911. Patient was tachycardic with SaO2 of 89% on arrival. Rate fever. Chest x-ray was negative for pneumonia. CT head negative for acute abnormality. Troponins were mildly elevated. CTA PE study was obtained and significant for possible PE with possible heart strain and no evidence of pneumonia. Heparin infusion started. He developed worsening fever to 103. Urgent lumbar puncture performed by interventional radiology and empiric coverage  with multiple antimicrobials and acyclovir were started. He was admitted to the ICU and critical care medicine consulted. Required vasopressor support. Has a history of trigeminal neuralgia on gabapentin and Tegretol. These were held initially. The left acute kidney injury with creatinine 1.69 and BUN of 26. High-sensitivity troponin 2150 on 11/07. Norepinephrine discontinued 11/07. TTE showed RV dysfunction consistent with McConnell sign, LV is hyperdynamic. Transferred to medicine service with telemetry on 11-08. Films of left knee performed unrevealing of infectious source. CSF culture no growth at 2 days. Continued on ceftriaxone 2 g IV every 24 hours and metronidazole 500 mg every 12 hours. Serum creatinine has trended down and now normal. Unable to transition to DOAC secondary to interactions with carbamazepine. He was started on warfarin on 11/09 with a goal INR of 2-3. Status now back to baseline. He has a history of type 2 diabetes mellitus and on sliding scale insulin, meal coverage and long-acting insulin. His home amlodipine has been restarted and losartan held due to kidney function. This time Crestor restarted. Received last dose of metronidazole this evening, 11/11. Was ambulated in the hallway with mobility specialist with contact-guard assist and rolling walker. Remittent left knee buckling.The patient requires inpatient medicine and rehabilitation evaluations and services for ongoing dysfunction secondary to septic shock, PE, debility.    Pt presents with decreased activity tolerance,  decreased functional mobility, decreased balance, decreased problem solving, decreased awareness Limiting pt's independence with leisure/community pursuits. Met with pt t& wife today to discuss TR services including leisure education, activity analysis/modifications.  Also discussed the importance of social, emotional, spiritual health in addition to physical health and their effects on overall health and wellness.   Pt stated understanding.     Plan  No further TR.  Will continue to monitor.  Recommendations for other services: None   Discharge Criteria: Patient will be discharged from TR if patient refuses treatment 3 consecutive times without medical reason.  If treatment goals not met, if there is a change in medical status, if patient makes no progress towards goals or if patient is discharged from hospital.  The above assessment, treatment plan, treatment alternatives and goals were discussed and mutually agreed upon: by patient  Larry Lane 01/11/2023, 3:58 PM

## 2023-01-11 NOTE — Progress Notes (Signed)
PROGRESS NOTE   Subjective/Complaints: No new complaints this morning Discussed that right sided motor/sensory deficit is likely femoral neuropathy and may have been from his lumbar puncture  ROS: +right sided lower extremity weakness   Objective:   No results found. Recent Labs    01/10/23 0452 01/11/23 0547  WBC 9.1 9.7  HGB 8.7* 9.1*  HCT 26.3* 27.2*  PLT 276 369   Recent Labs    01/10/23 0452  NA 137  K 3.8  CL 108  CO2 21*  GLUCOSE 147*  BUN 12  CREATININE 0.84  CALCIUM 8.0*    Intake/Output Summary (Last 24 hours) at 01/11/2023 1104 Last data filed at 01/11/2023 0824 Gross per 24 hour  Intake 960 ml  Output 200 ml  Net 760 ml        Physical Exam: Vital Signs Blood pressure (!) 161/74, pulse 82, temperature 98.1 F (36.7 C), resp. rate 17, SpO2 97%. Gen: no distress, normal appearing HEENT: oral mucosa pink and moist, NCAT Cardio: Reg rate Chest: normal effort, normal rate of breathing Abd: soft, non-distended Ext: no edema Psych: pleasant, normal affect Skin: intact Neuro: Alert and oriented x3 Musculoskeletal: 5/5 except for absent left knee extension, impaired hip flexion, and decreased sensation of left thigh.   Assessment/Plan: 1. Functional deficits which require 3+ hours per day of interdisciplinary therapy in a comprehensive inpatient rehab setting. Physiatrist is providing close team supervision and 24 hour management of active medical problems listed below. Physiatrist and rehab team continue to assess barriers to discharge/monitor patient progress toward functional and medical goals  Care Tool:  Bathing    Body parts bathed by patient: Face, Right upper leg, Left upper leg, Right arm, Left arm, Chest, Abdomen, Front perineal area   Body parts bathed by helper: Right lower leg, Left lower leg, Buttocks     Bathing assist Assist Level: Moderate Assistance - Patient 50 -  74%     Upper Body Dressing/Undressing Upper body dressing   What is the patient wearing?: Pull over shirt    Upper body assist Assist Level: Supervision/Verbal cueing    Lower Body Dressing/Undressing Lower body dressing      What is the patient wearing?: Underwear/pull up, Pants     Lower body assist Assist for lower body dressing: Moderate Assistance - Patient 50 - 74%     Toileting Toileting    Toileting assist Assist for toileting: Minimal Assistance - Patient > 75%     Transfers Chair/bed transfer  Transfers assist     Chair/bed transfer assist level: Minimal Assistance - Patient > 75%     Locomotion Ambulation   Ambulation assist      Assist level: Minimal Assistance - Patient > 75%   Max distance: 133'   Walk 10 feet activity   Assist     Assist level: Minimal Assistance - Patient > 75% Assistive device: Walker-rolling   Walk 50 feet activity   Assist    Assist level: Minimal Assistance - Patient > 75% Assistive device: Walker-rolling    Walk 150 feet activity   Assist Walk 150 feet activity did not occur: Safety/medical concerns  Walk 10 feet on uneven surface  activity   Assist Walk 10 feet on uneven surfaces activity did not occur: Safety/medical concerns         Wheelchair     Assist Is the patient using a wheelchair?: Yes Type of Wheelchair: Manual    Wheelchair assist level: Dependent - Patient 0%      Wheelchair 50 feet with 2 turns activity    Assist        Assist Level: Dependent - Patient 0%   Wheelchair 150 feet activity     Assist      Assist Level: Dependent - Patient 0%   Blood pressure (!) 161/74, pulse 82, temperature 98.1 F (36.7 C), resp. rate 17, SpO2 97%.  Medical Problem List and Plan: 1. Functional deficits secondary to debility 2/2 sepsis             -patient may shower             -ELOS/Goals: 8 days mod I   2.  Antithrombotics: -DVT/anticoagulation:   Pharmaceutical: Coumadin             -antiplatelet therapy: none   3. Pain Management: Tylenol, oxycodone 5 mg q 4 hrs prn as needed             -gabapentin 600 mg TID   4. Mood/Behavior/Sleep: LCSW to evaluate and provide emotional support             -trazodone 100 mg q HS prn             -antipsychotic agents: n/a   5. Neuropsych/cognition: This patient is not capable of making decisions on his own behalf.   6. Skin/Wound Care: Routine skin care checks   7. Fluids/Electrolytes/Nutrition: Routine Is and Os and follow-up chemistries             -carb modified diet   8: Hypertension: monitor TID and prn. Restart losartan 25mg  daily. 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

## 2023-01-11 NOTE — Progress Notes (Signed)
Patient ID: Larry Lane, male   DOB: 1940/03/13, 82 y.o.   MRN: [id number]  Met with pt and wife to discuss team conference update regarding goals of supervision level and target discharge date 11/19. Both discussing the need for a ramp for the two steps into the home. Discussed OP versus Home health and they would like OP and the closer facility is [facility]. Will make referral and ask they call wife to set up follow up appointments. Work on discharge needs.

## 2023-01-11 NOTE — Patient Care Conference (Signed)
Inpatient RehabilitationTeam Conference and Plan of Care Update Date: 01/11/2023   Time: 11:03 AM    Patient Name: Larry Lane      Medical Record Number: [id number]  Date of Birth: 01/05/1941 Sex: Male         Room/Bed: 4W25C/4W25C-01 Payor Info: Payor: [employer] MEDICARE / Plan: BCBS MEDICARE / Product Type: *No Product type* /    Admit Date/Time:  01/09/2023  4:47 PM  Primary Diagnosis:  Sepsis [facility])  Hospital Problems: Principal Problem:   Sepsis [facility])    Expected Discharge Date: Expected Discharge Date: 01/17/23  Team Members Present: Physician leading conference: Dr. Sula Soda Social Worker Present: Dossie Der, LCSW Nurse Present: Chana Bode, RN PT Present: Wynelle Link, PT OT Present: Bonnell Public, OT SLP Present: Feliberto Gottron, SLP PPS Coordinator present : Fae Pippin, SLP     Current Status/Progress Goal Weekly Team Focus  Bowel/Bladder   Pt continent of b/b   Remain continent   Assist with toileting qshift and prn    Swallow/Nutrition/ Hydration               ADL's   mod A LB dressing and bathing, set up UB bath8ing and dressing, min A SPT commode and walk in shower, weaker L LE, mild cog deficits   Supervision   strengthening, amb for ADL's, safety, family Educ, will need a shower seat if they want one better than the plastic stool they have and HHOT, his 82th b day is next Thurs and goal for him is to go home    Mobility   minA bed mobility, CGA sit<>stand to RW, CGA stand pivot transfers, CGA gait RW ~175ft   Supervision  LLE NMR, gait training, DC planning    Communication                Safety/Cognition/ Behavioral Observations  min cognitive linguistic deficits   sup A   recall, problem solving, awareness    Pain   No c/o pain   Remain pain free   Assess qshift and prn    Skin   Skin intact   Remain intact  Assess qshift and prn      Discharge Planning:  Home with wife who has been  staying here with him, he is doing well and wants to be home by birthday next Thursday   Team Discussion: Patient post sepsis with right  sided trigeminal neuralgia flare and left side femoral neuropathy post LP with mild cognitive deficits. Limited by pain and left lower extremity buckling.  Patient on target to meet rehab goals: yes, currently needs mod assist for lower body care and set up for upper body ADLs. Needs CGA for sit =- stand and stand pivot transfers and CGA for ambulation up to 100' using a RW.  *See Care Plan and progress notes for long and short-term goals.   Revisions to Treatment Plan:  N/a  Teaching Needs: Safety, medications, transfers, toileting, etc.   Current Barriers to Discharge: Decreased caregiver support and Home enviroment access/layout  Possible Resolutions to Barriers: Family education Ramp to entry of home HH follow up services     Medical Summary Current Status: obesity, right sided femoral neuropathy, anemia, hyperglycemia, hypertension, hypocalcemia, trigeminal neuralgia  Barriers to Discharge: Medical stability  Barriers to Discharge Comments: obesity, right sided femoral neuropathy, anemia, hyperglycemia, hypertension, hypocalcemia, trigeminal neuralgia Possible Resolutions to [employer] Focus: provide dietary education, discussed likely etiology, continue carbamazepine and gabapentin, add calcium supplement, continue  to monitor hgb weekly, add blood pressure medication   Continued Need for Acute Rehabilitation Level of Care: The patient requires daily medical management by a physician with specialized training in physical medicine and rehabilitation for the following reasons: Direction of a multidisciplinary physical rehabilitation program to maximize functional independence : Yes Medical management of patient stability for increased activity during participation in an intensive rehabilitation regime.: Yes Analysis of laboratory values  and/or radiology reports with any subsequent need for medication adjustment and/or medical intervention. : Yes   I attest that I was present, lead the team conference, and concur with the assessment and plan of the team.   Chana Bode B 01/11/2023, 12:46 PM

## 2023-01-11 NOTE — Progress Notes (Signed)
Occupational Therapy Session Note  Patient Details  Name: Larry Lane MRN: [id number] Date of Birth: April 23, 1940  Today's Date: 01/11/2023 OT Individual Time: 1049-1200 OT Individual Time Calculation (min): 71 min    Short Term Goals: Week 1:  OT Short Term Goal 1 (Week 1): STG=LTG based on LOS  Skilled Therapeutic Interventions/Progress Updates:  Pt greeted seated in w/c, pt agreeable to OT intervention.    Pts wife present during session   Transfers/bed mobility: pt completed all functional mobility/transfers with RW and CGA. Sit>stands with CGA in shower with use of grab bars.   ADLs:  UB dressing:pt donned OH shirt with set- up assist.  LB dressing: pt donned pants/underwear with MODA needing assist to don LLE into pants.  Footwear: donned socks with MAX A   Bathing: pt completed bathing seated/standing with MINA only needing assist to wash in between toes. Did issue pt LH sponge to assist with LB bathing next session  Transfers: pt completed stand pivot to shower seat with MIN A with use of grab bars.   Education: family education completed with wife in regards to shower transfers. Pt prefers to use walkin shower with RW with pt stepping in backwards into shower. Pt completed simulated transfer with RW and MINA. Wife reports they do have shower seat but she needs to figure out which seat she has. Recommended shower seat with bilateral arm rests and back rest. Education provided that they could use BSC as shower seat if needed. Education provided on using grab bars if pt opts to stand, if not pt can laterally lean to wash buttock from sitting.   Ended session with pt seated in w/c with all needs within reach and safety belt alarm activated.                    Therapy Documentation Precautions:  Precautions Precautions: Fall Precaution Comments: L knee buckle Restrictions Weight Bearing Restrictions: No  Pain: No pain reported during session    Therapy/Group:  Individual Therapy  Barron Schmid 01/11/2023, 12:38 PM

## 2023-01-11 NOTE — Progress Notes (Signed)
PHARMACY - ANTICOAGULATION CONSULT NOTE  Pharmacy Consult for Warfarin Indication:  rule out pulmonary embolus  No Known Allergies  Patient Measurements:    Vital Signs: Temp: 98.1 F (36.7 C) (11/13 0453) BP: 161/74 (11/13 0453) Pulse Rate: 82 (11/13 0453)  Labs: Recent Labs    01/09/23 0633 01/10/23 0452 01/11/23 0547  HGB 9.5* 8.7* 9.1*  HCT 28.0* 26.3* 27.2*  PLT 232 276 369  LABPROT 30.5* 30.9* 22.9*  INR 2.9* 2.9* 2.0*  HEPARINUNFRC 0.44  --   --   CREATININE  --  0.84  --     Estimated Creatinine Clearance: 72.8 mL/min (by C-G formula based on SCr of 0.84 mg/dL).   Assessment:  82 y/o M with CT angiogram chest with left upper lobe/left lower lobe pulmonary emboli w/RHS. Not on anticoagulation prior to inpatient admission. Transitioned from heparin (stopped 11/11). Pharmacy consulted to manage warfarin.    Significant DDI with warfarin: carbamazepine (decreases INR), metronidazole 11/7>>11/12 (increases INR)  No dose 11/11 d/t jump in INR after only 2 doses. Hgb/Plt remains stable. No bleeding reported. PO intake documented 50-100%. Metronidazole completed yesterday. INR remains therapeutic at 2.0 today.    Goal of Therapy:  INR: 2-3 Monitor platelets by anticoagulation protocol: Yes   Plan:  Give warfarin 2.5 mg po x 1 Monitor daily INR, CBC, clinical course, s/sx of bleed, PO intake/diet, Drug-Drug Interactions   Thank you for allowing pharmacy to be a part of this patient's care.   Signe Colt, PharmD 01/11/2023 9:43 AM  **Pharmacist phone directory can be found on [email or web address] listed under [facility]**

## 2023-01-11 NOTE — Progress Notes (Signed)
Speech Language Pathology Daily Session Note  Patient Details  Name: Larry Lane MRN: [id number] Date of Birth: 10-Jul-1940  Today's Date: 01/11/2023 SLP Individual Time: 8416-6063 SLP Individual Time Calculation (min): 43 min  Short Term Goals: Week 1: SLP Short Term Goal 1 (Week 1): STGs= LTGs due to ELOS  Skilled Therapeutic Interventions:  Patient was seen in am to address cognitive re- training. Pt was alert and seated upright in WC upon SLP arrival. Pt's wife present and participating intermittently throughout session. SLP implemented and educated pt in WRAP compensatory strategies and examples of utilization. When presented with moderate level information, SLP guided pt in utilization of repetition and association strategies. Given a 15 minute delay, pt recalled information with 100% acc. SLP also addressed recall of medical hx with pt requiring min A. SLP also addressed basic problem solving. Pt initially stating standing up by himself was safe. SLP educated pt on recommendations for safety within current environment. He identified additional solutions to problems with 83% acc with sup A. Pt was left seated upright in [facility] with chair alarm active, call button within reach, and wife present. SLP to continue POC.  Pain Pain Assessment Pain Scale: 0-10 Pain Score: 0-No pain  Therapy/Group: Individual Therapy  Renaee Munda 01/11/2023, 11:43 AM

## 2023-01-12 ENCOUNTER — Inpatient Hospital Stay (HOSPITAL_COMMUNITY): Payer: Medicare Other

## 2023-01-12 DIAGNOSIS — A419 Sepsis, unspecified organism: Secondary | ICD-10-CM | POA: Diagnosis not present

## 2023-01-12 DIAGNOSIS — R6520 Severe sepsis without septic shock: Secondary | ICD-10-CM | POA: Diagnosis not present

## 2023-01-12 DIAGNOSIS — G5721 Lesion of femoral nerve, right lower limb: Secondary | ICD-10-CM | POA: Diagnosis not present

## 2023-01-12 DIAGNOSIS — N179 Acute kidney failure, unspecified: Secondary | ICD-10-CM | POA: Diagnosis not present

## 2023-01-12 LAB — GLUCOSE, CAPILLARY
Glucose-Capillary: 157 mg/dL — ABNORMAL HIGH (ref 70–99)
Glucose-Capillary: 234 mg/dL — ABNORMAL HIGH (ref 70–99)
Glucose-Capillary: 195 mg/dL — ABNORMAL HIGH (ref 70–99)
Glucose-Capillary: 218 mg/dL — ABNORMAL HIGH (ref 70–99)

## 2023-01-12 LAB — CBC
HCT: 29.5 % — ABNORMAL LOW (ref 39.0–52.0)
Hemoglobin: 9.8 g/dL — ABNORMAL LOW (ref 13.0–17.0)
MCH: 31.3 pg (ref 26.0–34.0)
MCHC: 33.2 g/dL (ref 30.0–36.0)
MCV: 94.2 fL (ref 80.0–100.0)
Platelets: 471 10*3/uL — ABNORMAL HIGH (ref 150–400)
RBC: 3.13 MIL/uL — ABNORMAL LOW (ref 4.22–5.81)
RDW: 12.7 % (ref 11.5–15.5)
WBC: 12.1 10*3/uL — ABNORMAL HIGH (ref 4.0–10.5)
nRBC: 0 % (ref 0.0–0.2)

## 2023-01-12 LAB — PROTIME-INR
INR: 2.6 — ABNORMAL HIGH (ref 0.8–1.2)
Prothrombin Time: 28.2 s — ABNORMAL HIGH (ref 11.4–15.2)

## 2023-01-12 MED ORDER — WARFARIN 0.5 MG HALF TABLET
0.5000 mg | ORAL_TABLET | Freq: Once | ORAL | Status: AC
  Administered 2023-01-12: 0.5 mg via ORAL
  Filled 2023-01-12: qty 1

## 2023-01-12 MED ORDER — INSULIN GLARGINE-YFGN 100 UNIT/ML ~~LOC~~ SOLN
6.0000 [IU] | Freq: Every day | SUBCUTANEOUS | Status: DC
Start: 2023-01-13 — End: 2023-01-16
  Administered 2023-01-13 – 2023-01-16 (×4): 6 [IU] via SUBCUTANEOUS
  Filled 2023-01-12 (×4): qty 0.06

## 2023-01-12 NOTE — Plan of Care (Signed)
  Problem: Consults Goal: RH GENERAL PATIENT EDUCATION Description: See Patient Education module for education specifics. Outcome: Progressing   Problem: RH BOWEL ELIMINATION Goal: RH STG MANAGE BOWEL WITH ASSISTANCE Description: STG Manage Bowel with supervision Assistance. Outcome: Progressing Goal: RH STG MANAGE BOWEL W/MEDICATION W/ASSISTANCE Description: STG Manage Bowel with Medication with supervision Assistance. Outcome: Progressing   Problem: RH SKIN INTEGRITY Goal: RH STG SKIN FREE OF INFECTION/BREAKDOWN Description: Skin to remain intact and free of breakdown/infection with min assist  Outcome: Progressing Goal: RH STG MAINTAIN SKIN INTEGRITY WITH ASSISTANCE Description: STG Maintain Skin Integrity With cueing Assistance. Outcome: Progressing   Problem: RH SAFETY Goal: RH STG ADHERE TO SAFETY PRECAUTIONS W/ASSISTANCE/DEVICE Description: STG Adhere to Safety Precautions With cues Assistance/Device. Outcome: Progressing Goal: RH STG DECREASED RISK OF FALL WITH ASSISTANCE Description: STG Decreased Risk of Fall With supervision Assistance. Outcome: Progressing   Problem: RH PAIN MANAGEMENT Goal: RH STG PAIN MANAGED AT OR BELOW PT'S PAIN GOAL Description: Less than 3 with PRN medications min assist  Outcome: Progressing   Problem: RH KNOWLEDGE DEFICIT GENERAL Goal: RH STG INCREASE KNOWLEDGE OF SELF CARE AFTER HOSPITALIZATION Description: Patient/caregiver will be able to manage medications, self care, and diet/lifestyle modifications to improve A1C and overall health from nursing education and nursing handouts independently  Outcome: Progressing

## 2023-01-12 NOTE — Progress Notes (Signed)
PHARMACY - ANTICOAGULATION CONSULT NOTE  Pharmacy Consult for Warfarin Indication:  rule out pulmonary embolus  No Known Allergies  Patient Measurements:    Vital Signs: Temp: 98.1 F (36.7 C) (11/14 0620) BP: 146/68 (11/14 0620) Pulse Rate: 77 (11/14 0620)  Labs: Recent Labs    01/10/23 0452 01/11/23 0547 01/12/23 0801  HGB 8.7* 9.1* 9.8*  HCT 26.3* 27.2* 29.5*  PLT 276 369 471*  LABPROT 30.9* 22.9* 28.2*  INR 2.9* 2.0* 2.6*  CREATININE 0.84  --   --     Estimated Creatinine Clearance: 72.8 mL/min (by C-G formula based on SCr of 0.84 mg/dL).   Assessment:  82 y/o M with CT angiogram chest with left upper lobe/left lower lobe pulmonary emboli w/RHS. Not on anticoagulation prior to inpatient admission. Transitioned from heparin (stopped 11/11). Pharmacy consulted to manage warfarin.    Significant DDI with warfarin: carbamazepine (decreases INR), metronidazole 11/7>>11/12 (increases INR)  No dose 11/11 d/t jump in INR after only 2 doses.   Hgb/Plt remains stable. No bleeding reported. PO intake documented 50-100%. Metronidazole completed yesterday.  INR remains therapeutic at 2.6 today. Patient seems to be very sensitive to warfarin, but also just completed Flagyl course and on carbamazepine.   Goal of Therapy:  INR: 2-3 Monitor platelets by anticoagulation protocol: Yes   Plan:  Give warfarin 0.5 mg po x 1 Monitor daily INR, CBC, clinical course, s/sx of bleed, PO intake/diet, Drug-Drug Interactions   Thank you for allowing pharmacy to be a part of this patient's care.   Signe Colt, PharmD 01/12/2023 12:06 PM  **Pharmacist phone directory can be found on [email or web address] listed under [facility]**

## 2023-01-12 NOTE — Progress Notes (Signed)
PROGRESS NOTE   Subjective/Complaints: Discussed improvement in Hgb, increase in WBC, denies dysuria, discussed that we would repeat tomorrow to trend Insulin increased to 6U  ROS: +left sided lower extremity weakness and sensory deficit   Objective:   No results found. Recent Labs    01/11/23 0547 01/12/23 0801  WBC 9.7 12.1*  HGB 9.1* 9.8*  HCT 27.2* 29.5*  PLT 369 471*   Recent Labs    01/10/23 0452  NA 137  K 3.8  CL 108  CO2 21*  GLUCOSE 147*  BUN 12  CREATININE 0.84  CALCIUM 8.0*    Intake/Output Summary (Last 24 hours) at 01/12/2023 1300 Last data filed at 01/12/2023 0824 Gross per 24 hour  Intake 360 ml  Output 200 ml  Net 160 ml        Physical Exam: Vital Signs Blood pressure (!) 146/68, pulse 77, temperature 98.1 F (36.7 C), resp. rate 18, SpO2 95%. Gen: no distress, normal appearing HEENT: oral mucosa pink and moist, NCAT Cardio: Reg rate Chest: normal effort, normal rate of breathing Abd: soft, non-distended Ext: no edema Psych: pleasant, normal affect Skin: intact Neuro: Alert and oriented x3 Musculoskeletal: 5/5 except for absent left knee extension, impaired hip flexion, and decreased sensation of left thigh. Walking with S with RW  Assessment/Plan: 1. Functional deficits which require 3+ hours per day of interdisciplinary therapy in a comprehensive inpatient rehab setting. Physiatrist is providing close team supervision and 24 hour management of active medical problems listed below. Physiatrist and rehab team continue to assess barriers to discharge/monitor patient progress toward functional and medical goals  Care Tool:  Bathing    Body parts bathed by patient: Face, Right upper leg, Left upper leg, Right arm, Left arm, Chest, Abdomen, Front perineal area   Body parts bathed by helper: Right lower leg, Left lower leg, Buttocks     Bathing assist Assist Level: Minimal  Assistance - Patient > 75%     Upper Body Dressing/Undressing Upper body dressing   What is the patient wearing?: Pull over shirt    Upper body assist Assist Level: Set up assist    Lower Body Dressing/Undressing Lower body dressing      What is the patient wearing?: Underwear/pull up, Pants     Lower body assist Assist for lower body dressing: Moderate Assistance - Patient 50 - 74%     Toileting Toileting    Toileting assist Assist for toileting: Minimal Assistance - Patient > 75%     Transfers Chair/bed transfer  Transfers assist     Chair/bed transfer assist level: Contact Guard/Touching assist     Locomotion Ambulation   Ambulation assist      Assist level: Minimal Assistance - Patient > 75% Assistive device: Walker-rolling Max distance: 145'   Walk 10 feet activity   Assist     Assist level: Minimal Assistance - Patient > 75% Assistive device: Walker-rolling   Walk 50 feet activity   Assist    Assist level: Minimal Assistance - Patient > 75% Assistive device: Walker-rolling    Walk 150 feet activity   Assist Walk 150 feet activity did not occur: Safety/medical concerns  Walk 10 feet on uneven surface  activity   Assist Walk 10 feet on uneven surfaces activity did not occur: Safety/medical concerns         Wheelchair     Assist Is the patient using a wheelchair?: Yes Type of Wheelchair: Manual    Wheelchair assist level: Dependent - Patient 0%      Wheelchair 50 feet with 2 turns activity    Assist        Assist Level: Dependent - Patient 0%   Wheelchair 150 feet activity     Assist      Assist Level: Dependent - Patient 0%   Blood pressure (!) 146/68, pulse 77, temperature 98.1 F (36.7 C), resp. rate 18, SpO2 95%.  Medical Problem List and Plan: 1. Functional deficits secondary to debility 2/2 sepsis             -patient may shower             -ELOS/Goals: 8 days mod I   2.   Antithrombotics: -DVT/anticoagulation:  Pharmaceutical: Coumadin             -antiplatelet therapy: none   3. Pain Management: Tylenol, oxycodone 5 mg q 4 hrs prn as needed             -gabapentin 600 mg TID   4. Mood/Behavior/Sleep: LCSW to evaluate and provide emotional support             -trazodone 100 mg q HS prn             -antipsychotic agents: n/a   5. Neuropsych/cognition: This patient is not capable of making decisions on his own behalf.   6. Skin/Wound Care: Routine skin care checks   7. Fluids/Electrolytes/Nutrition: Routine Is and Os and follow-up chemistries             -carb modified diet   8: Hypertension: monitor TID and prn. Restart losartan 25mg  daily. 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 MRI left femur ordered   LOS: 3 days A FACE TO FACE EVALUATION WAS PERFORMED  Jahlia Omura P Laurice Kimmons 01/12/2023, 1:00 PM

## 2023-01-12 NOTE — Progress Notes (Signed)
Speech Language Pathology Discharge Summary  Patient Details  Name: Larry Lane MRN: [id number] Date of Birth: 1940/10/04  Date of Discharge from SLP service:January 12, 2023  Today's Date: 01/12/2023 SLP Individual Time: 1000-1025 SLP Individual Time Calculation (min): 25 min   Skilled Therapeutic Interventions:   Skilled therapy session focused on cognitive goals and family education. Upon entrance, patient/family report patient is at cognitive b/l and feel as if he would not benefit from ST this admission. SLP reviewed evaluation note and ST goals as well as potential treatment/strategies to be completed in speech therapy sessions. Wife and patient report they feel as if patient would best benefit from intensive PT/OT during rehab stay due to low cognitive load required at b/l. Patient able to independently recall PLOF as well as verbalized awareness of deficits. Patient to be d/c from ST due to cognitive status at baseline. Patient left in chair with alarm set and call bell in reach. Wife present.     Patient has met 3 of 3 long term goals.  Patient to discharge at overall Supervision level.  Reasons goals not met: n/a   Clinical Impression/Discharge Summary:  Pt has made great gains and has met 3 of 3 LTG's this admission due to family reports return to cognitive linguistic baseline. Pt is currently an overall supervision A for cognitive tasks including awareness, memory, and problem solving. Pt/family education complete. No f/u ST recommended at this time. Please re-consult ST if change in status occurs.   Care Partner:  Caregiver Able to Provide Assistance: Yes     Recommendation:  None      Equipment: n/a   Reasons for discharge: Treatment goals met   Patient/Family Agrees with Progress Made and Goals Achieved: Yes    Domingue Coltrain M.A., CF-SLP 01/12/2023, 10:35 AM

## 2023-01-12 NOTE — Plan of Care (Signed)
?  Problem: RH Problem Solving Goal: LTG Patient will demonstrate problem solving for (SLP) Description: LTG:  Patient will demonstrate problem solving for basic/complex daily situations with cues  (SLP) Outcome: Completed/Met   Problem: RH Memory Goal: LTG Patient will use memory compensatory aids to (SLP) Description: LTG:  Patient will use memory compensatory aids to recall biographical/new, daily complex information with cues (SLP) Outcome: Completed/Met   Problem: RH Awareness Goal: LTG: Patient will demonstrate awareness during functional activites type of (SLP) Description: LTG: Patient will demonstrate awareness during functional activites type of (SLP) Outcome: Completed/Met   

## 2023-01-12 NOTE — Progress Notes (Signed)
Physical Therapy Session Note  Patient Details  Name: Larry Lane MRN: [id number] Date of Birth: [birth year]  Today's Date: 01/12/2023 PT Individual Time: 1315-1355 PT Individual Time Calculation (min): 40 min   Short Term Goals: Week 1:  PT Short Term Goal 1 (Week 1): STG = LTG due to ELOS  Skilled Therapeutic Interventions/Progress Updates:    '   Pt sitting in wheelchair on arrival with family at the bedside. No reports of pain. Reports that he is waiting on transport for MRI of his L leg but agreeable to therapy treatment.   Transported in w/c to day room rehab gym.  Sit<>stand to RW with CGA. Ambulates 2x18ft with CGA and RW - x1 instance of soft L knee buckling but nothing that caused him to lose his balance. Worked on reducing arm support through RW and keeping upright posture.   Standing balance addressed through games of Horse Shoe (pt reports playing this as a hobby when he was younger) and he enjoyed this activity. Completed in unsupported standing to challenge balance and to encourage weight bearing through LLE.   Completed Toe Taps to 6" block with his RLE and RW support 2x10 and then to 4" block with his LLE with RW support. (LLE unable to raise to 6" height).   Pt returned to his room and ended treatment sitting in w/c - aware of upcoming therapy schedule. All needs met at end. Wife present.   Therapy Documentation Precautions:  Precautions Precautions: Fall Precaution Comments: L knee buckle Restrictions Weight Bearing Restrictions: No General:     Therapy/Group: Individual Therapy  Roselynn Whitacre P Elbie Statzer 01/12/2023, 1:54 PM

## 2023-01-12 NOTE — Progress Notes (Signed)
Physical Therapy Session Note  Patient Details  Name: Larry Lane MRN: [id number] Date of Birth: [birth year]  Today's Date: 01/12/2023 PT Individual Time: 0832-0902 PT Individual Time Calculation (min): 30 min   Short Term Goals: Week 1:  PT Short Term Goal 1 (Week 1): STG = LTG due to ELOS   Skilled Therapeutic Interventions/Progress Updates:    Pt presents up in w/c with wife at bedside. Pt and family had questions about ramp recommendations and stair acces in the home. WIfe reports her daughter has pictures of the temporary ramp they installed and wants [place] to look at it to see if we think that would work. Also discussed that once inside there is a sunken living room and discussed option to trial navigating single step with RW backwards up the step vs forwards to increase leverage and support through UE's. Demonstrated technique to pt and wife in room with plan to trial in gym. Pt request to use urinal before leaving room (+void). Max assist to don socks and shoes to prepare for session. Gait training with RW for functional strengthening with cues for glute activation to promote knee extension as well in stance. No episode of L knee buckling and pt able to gait x 145' with CGA to min assist. Handoff to next PT (primary).   Therapy Documentation Precautions:  Precautions Precautions: Fall Precaution Comments: L knee buckle Restrictions Weight Bearing Restrictions: No  Pain: Pain Assessment Pain Scale: 0-10 Pain Score: 0-No pain   Therapy/Group: Individual Therapy  Karolee Stamps Darrol Poke, PT, DPT, CBIS  01/12/2023, 9:10 AM

## 2023-01-12 NOTE — Progress Notes (Signed)
Physical Therapy Session Note  Patient Details  Name: Larry Lane MRN: [id number] Date of Birth: [birth year]  Today's Date: 01/12/2023 PT Individual Time: 0902-0945 PT Individual Time Calculation (min): 43 min   Short Term Goals: Week 1:  PT Short Term Goal 1 (Week 1): STG = LTG due to ELOS  Skilled Therapeutic Interventions/Progress Updates:      Direct handoff of care from PT to start. Discussed home setup and answered questions re: ramped entrance and navigating the sunken living room. Plan for family to take pictures and email to help clarify setup.   Pt ambulated ~61ft with CGA and RW from 4W elevators into main rehab gym - soft knee buckling x1 on the L but no true knee buckling or LOB. Retrieved 4" platform step to simulate sunken living room and curb navigation. Patient navigated up/down x4 times at CGA level using the RW - pt with understanding of technique due to prior knee replacements and using the RW to navigate in/out of the living room. He did require min cues for going up with his stronger R leg and descending with his weaker L leg.   Pt assisted onto Nustep and completed 10 minutes at L7 resistance, using BUE/BLE combination to challenge general strengthening and cardiovascular endurance . Achieved 255 steps total.   Ambulated back towards his room with CGA and RW, ~162ft, with cues for upright posture, limiting UE support through RW, focusing on L knee to prevent from buckling. Gait distances limited by fatigue. Returned remaining distance back to his room and left sitting in w/c with safety belt alarm on. All needs met.   Therapy Documentation Precautions:  Precautions Precautions: Fall Precaution Comments: L knee buckle Restrictions Weight Bearing Restrictions: No General:    Therapy/Group: Individual Therapy  Orrin Brigham 01/12/2023, 7:51 AM

## 2023-01-12 NOTE — Progress Notes (Signed)
Occupational Therapy Session Note  Patient Details  Name: BARDO GIUSTINO MRN: [id number] Date of Birth: [birth year]  Today's Date: 01/12/2023 OT Individual Time: 7829-5621 OT Individual Time Calculation (min): 54 min    Short Term Goals: Week 1:  OT Short Term Goal 1 (Week 1): STG=LTG based on LOS  Skilled Therapeutic Interventions/Progress Updates:    1:1 Pt received in the w/c and asking when he would go for his MRI of his left LE- he had been waiting all day. Checked with nursing and they are still waiting. Pt participated in bathing at shower level with min guard for transfer with RW to the shower. Pt able to bathe everything sit to stand except for left foot. Pt ambulated out of the bathroom with min A with one LOB over the threshold requiring A to maintain balance. Discussed dressing left leg first for increased success. Pt did required A to dress left Le but able to perform the rest of LB with contact guard for sit to stand. Pt ambulated back to bed with RW with contact guard. Pt able to cross right Le and don sock but at this time unable to tolerate crossing left to thread sock.  Pt returned to supine and left resting in prep for MRI.  Wife at bedside.   Therapy Documentation Precautions:  Precautions Precautions: Fall Precaution Comments: L knee buckle Restrictions Weight Bearing Restrictions: No  Pain:  Reports pain in left thigh especially with attempts to cross leg. Pt returned to laying down in bed in prep for MRI. Allowed for rest breaks as needed.   Therapy/Group: Individual Therapy  Roney Mans [facility] 01/12/2023, 3:00 PM

## 2023-01-12 NOTE — IPOC Note (Signed)
Overall Plan of Care [facility]) Patient Details Name: Larry Lane MRN: [id number] DOB: 01/12/1941  Admitting Diagnosis: Sepsis [facility])  Hospital Problems: Principal Problem:   Sepsis (HCC)     Functional Problem List: Nursing Bowel, Medication Management, Pain, Safety, Sensory  PT Balance, Endurance, Safety, Motor, Pain, Sensory  OT Balance, Pain, Cognition, Edema, Endurance, Skin Integrity, Motor  SLP Cognition  TR         Basic ADL's: OT Grooming, Bathing, Dressing, Toileting     Advanced  ADL's: OT Simple Meal Preparation, Laundry, Light Housekeeping     Transfers: PT Bed Mobility, Bed to Chair, Customer service manager, Tub/Shower     Locomotion: PT Ambulation, Stairs     Additional Impairments: OT    SLP Social Cognition   Problem Solving, Memory, Attention, Awareness  TR      Anticipated Outcomes Item Anticipated Outcome  Self Feeding indep  Swallowing      Basic self-care  mod I UB, Supervision LB  Toileting  mod I   Bathroom Transfers supervision  Bowel/Bladder  continent of bowel/bladder with resolution of constipation  Transfers  supervision  Locomotion  supervision ambulatory  Communication     Cognition  sup A  Pain  less than 4  Safety/Judgment  with cues   Therapy Plan: PT Intensity: Minimum of 1-2 x/day ,45 to 90 minutes PT Frequency: 5 out of 7 days PT Duration Estimated Length of Stay: 8-10 days OT Intensity: Minimum of 1-2 x/day, 45 to 90 minutes OT Frequency: 5 out of 7 days OT Duration/Estimated Length of Stay: 7-10 d SLP Intensity: Minumum of 1-2 x/day, 30 to 90 minutes SLP Frequency: 1 to 3 out of 7 days SLP Duration/Estimated Length of Stay: 7-9 days   Team Interventions: Nursing Interventions Bowel Management, Pain Management, Medication Management, Discharge Planning, Psychosocial Support  PT interventions Ambulation/gait training, Balance/vestibular training, Cognitive remediation/compensation, Pain management, Stair  training, Therapeutic Activities, Patient/family education, DME/adaptive equipment instruction, Therapeutic Exercise, Functional mobility training, UE/LE Strength taining/ROM, Discharge planning, Neuromuscular re-education, Splinting/orthotics, UE/LE Coordination activities  OT Interventions Balance/vestibular training, Community reintegration, Disease mangement/prevention, Functional electrical stimulation, Neuromuscular re-education, Patient/family education, Self Care/advanced ADL retraining, Splinting/orthotics, Therapeutic Exercise, UE/LE Coordination activities, Wheelchair propulsion/positioning, DME/adaptive equipment instruction, Cognitive remediation/compensation, Discharge planning, Functional mobility training, Pain management, Psychosocial support, Skin care/wound managment, Therapeutic Activities, UE/LE Strength taining/ROM, Visual/perceptual remediation/compensation  SLP Interventions Cognitive remediation/compensation, Internal/external aids, Environmental controls, Therapeutic Activities, Patient/family education, Multimodal communication approach, Functional tasks  TR Interventions    SW/CM Interventions Discharge Planning, Psychosocial Support, Patient/Family Education   Barriers to Discharge MD  Medical stability  Nursing Decreased caregiver support, Home environment access/layout, Weight home with spouse 1 level with 6 ste and BHR  PT Inaccessible home environment, Home environment access/layout 3 STE with BHRs, 2 STE with no HRs  OT Inaccessible home environment, Home environment access/layout STE, stall shower without DME, new STM deficits and L LE weakness  SLP      SW       Team Discharge Planning: Destination: PT-Home ,OT- Home , SLP-Home Projected Follow-up: PT-Outpatient PT, OT-  Home health OT, SLP-None Projected Equipment Needs: PT-To be determined, OT- 3 in 1 bedside comode, Tub/shower seat, SLP-None recommended by SLP Equipment Details: PT-pt owns RW, OT-   Patient/family involved in discharge planning: PT- Patient,  OT-Patient, Family member/caregiver, SLP-Patient  MD ELOS: 8 days Medical Rehab Prognosis:  Excellent Assessment: The patient has been admitted for CIR therapies with the 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 The team will be addressing functional mobility, strength, stamina, balance, safety, adaptive techniques and equipment, self-care, bowel and bladder mgt, patient and caregiver education. Goals have been set at modI. Anticipated discharge destination is home..        See Team Conference Notes for weekly updates to the plan of care

## 2023-01-13 ENCOUNTER — Inpatient Hospital Stay (HOSPITAL_COMMUNITY): Payer: Medicare Other

## 2023-01-13 DIAGNOSIS — K529 Noninfective gastroenteritis and colitis, unspecified: Secondary | ICD-10-CM | POA: Diagnosis not present

## 2023-01-13 DIAGNOSIS — R195 Other fecal abnormalities: Secondary | ICD-10-CM | POA: Diagnosis not present

## 2023-01-13 DIAGNOSIS — G5721 Lesion of femoral nerve, right lower limb: Secondary | ICD-10-CM | POA: Diagnosis not present

## 2023-01-13 DIAGNOSIS — D62 Acute posthemorrhagic anemia: Secondary | ICD-10-CM | POA: Diagnosis not present

## 2023-01-13 DIAGNOSIS — I2699 Other pulmonary embolism without acute cor pulmonale: Secondary | ICD-10-CM

## 2023-01-13 DIAGNOSIS — A419 Sepsis, unspecified organism: Secondary | ICD-10-CM | POA: Diagnosis not present

## 2023-01-13 DIAGNOSIS — N179 Acute kidney failure, unspecified: Secondary | ICD-10-CM | POA: Diagnosis not present

## 2023-01-13 DIAGNOSIS — R6520 Severe sepsis without septic shock: Secondary | ICD-10-CM | POA: Diagnosis not present

## 2023-01-13 LAB — CBC
HCT: 26.9 % — ABNORMAL LOW (ref 39.0–52.0)
Hemoglobin: 8.6 g/dL — ABNORMAL LOW (ref 13.0–17.0)
MCH: 30.3 pg (ref 26.0–34.0)
MCHC: 32 g/dL (ref 30.0–36.0)
MCV: 94.7 fL (ref 80.0–100.0)
Platelets: 463 10*3/uL — ABNORMAL HIGH (ref 150–400)
RBC: 2.84 MIL/uL — ABNORMAL LOW (ref 4.22–5.81)
RDW: 12.9 % (ref 11.5–15.5)
WBC: 8.4 10*3/uL (ref 4.0–10.5)
nRBC: 0 % (ref 0.0–0.2)

## 2023-01-13 LAB — GLUCOSE, CAPILLARY
Glucose-Capillary: 103 mg/dL — ABNORMAL HIGH (ref 70–99)
Glucose-Capillary: 172 mg/dL — ABNORMAL HIGH (ref 70–99)
Glucose-Capillary: 174 mg/dL — ABNORMAL HIGH (ref 70–99)
Glucose-Capillary: 205 mg/dL — ABNORMAL HIGH (ref 70–99)

## 2023-01-13 LAB — IRON AND TIBC
Iron: 32 ug/dL — ABNORMAL LOW (ref 45–182)
Saturation Ratios: 13 % — ABNORMAL LOW (ref 17.9–39.5)
TIBC: 238 ug/dL — ABNORMAL LOW (ref 250–450)
UIBC: 206 ug/dL

## 2023-01-13 LAB — PROTIME-INR
INR: 2 — ABNORMAL HIGH (ref 0.8–1.2)
Prothrombin Time: 22.6 s — ABNORMAL HIGH (ref 11.4–15.2)

## 2023-01-13 LAB — VITAMIN B12: Vitamin B-12: 584 pg/mL (ref 180–914)

## 2023-01-13 LAB — OCCULT BLOOD X 1 CARD TO LAB, STOOL: Fecal Occult Bld: POSITIVE — AB

## 2023-01-13 LAB — FERRITIN: Ferritin: 144 ng/mL (ref 24–336)

## 2023-01-13 LAB — FOLATE: Folate: 21.9 ng/mL (ref >5.9)

## 2023-01-13 MED ORDER — IOHEXOL 350 MG/ML SOLN
75.0000 mL | Freq: Once | INTRAVENOUS | Status: AC | PRN
  Administered 2023-01-13: 75 mL via INTRAVENOUS

## 2023-01-13 MED ORDER — ACARBOSE 25 MG PO TABS
25.0000 mg | ORAL_TABLET | Freq: Three times a day (TID) | ORAL | Status: DC
  Administered 2023-01-13 – 2023-01-17 (×13): 25 mg via ORAL
  Filled 2023-01-13 (×13): qty 1

## 2023-01-13 NOTE — Consult Note (Signed)
Consultation Note   Referring Provider:  Inpatient Rehab - Delle Reining, NP Hospitalist PCP: Jerl Mina, MD Primary Gastroenterologist: Gentry Fitz      Reason for Consultation: FOBT + anemia  DOA: 01/09/2023         Hospital Day: 5   ASSESSMENT    Brief Narrative:  82 y.o. year old male with a history of trigeminal neuralgia,  HTN and appendectomy.  Admitted 11/6 to 11/11 with sepsis requiring ICU admission with pressors. Extensive evaluation for source of sepsis, including lumbar puncture,  was unremarkable . Sepsis felt to be 2/2 to ileitis seen on CT scan. A CT chest raised concern for a PE, started on warfarin.  Transferred to CIR on 01/09/23.    Sepsis ( reason for admission). Resolved. Source unknown but was felt to be 2/2 to ileitis.  --Received 7 days of flagyl  Ileitis with partial or early SBO on admission.  Admission CT scan 11/6 showing segmental thickening of the walls of the terminal ileum with dilatation of the small bowel proximal to this segment, resulting in partial or early small bowel obstruction.  May have been infectious but denies acute GI symptoms prior to admission.  He has chronic loose stool and chronic intermittent lower abdominal discomfort at baseline prior to admission. IBD?   Chronic anemia, baseline hgb mid 11 range.   Worsening of chronic anemia / FOBT x2 on warfarin Hgb 12.4 in Sept >> 11.7 at time of this admission >> down over last several days to 8.6 where is has remained stable.  Questionable PE  - CT scan earlier this admission raising concern for LUL PE with  right heart strain.   Elevated troponin earlier this admission felt to be 2/2 to . demand ischemia in the setting of septic shock from underlying ileitis versus submassive pulmonary embolism  DM2   Principal Problem:   Sepsis [facility])     PLAN:   --Inpatient rehab has ordered CTA of chest to try and determine if PE is in fact present (  prior imaging equivocal).  If not then maybe stop warfarin? --Checking iron studies --Colonoscopy with intubation of colonoscopy at some point. Will await CTA chest findings to determine if has PE.    HPI   Larry Lane was admitted several days ago with sepsis of unclear etiology other than maybe ileitis seen on CT scan. He gives a history of chronic loose stool and chronic intermittent lower abdominal discomfort ( both present for years). Prior to admission he had not had worsening of any GI symptoms or any new GI symptoms Weight stable at home. He hasn't seen any blood in his stools. He thinks he had a colonoscopy but it was many years ago. He Denies NSAID use (including ASA) though a daily baby asa is listed on home med list. On daily PPI.   Notable labs / Imaging / Events this admission  :  --Hgb 12.4 in Sept >> 11.7 at time of this admission >> down over last several days to 8.6  --MCV 93 --Troponin 2150  CT AP with contrast 11/6 Segmental thickening of the walls of the terminal ileum, possible infectious or inflammatory ileitis with dilatation of the small bowel proximal to this segment, resulting in partial  or early small bowel obstruction.   Labs and Imaging: Recent Labs    01/11/23 0547 01/12/23 0801 01/13/23 0608  WBC 9.7 12.1* 8.4  HGB 9.1* 9.8* 8.6*  HCT 27.2* 29.5* 26.9*  PLT 369 471* 463*   No results for input(s): "NA", "K", "CL", "CO2", "GLUCOSE", "BUN", "CREATININE", "CALCIUM" in the last 72 hours. No results for input(s): "PROT", "ALBUMIN", "AST", "ALT", "ALKPHOS", "BILITOT", "BILIDIR", "IBILI" in the last 72 hours. No results for input(s): "HEPBSAG", "HCVAB", "HEPAIGM", "HEPBIGM" in the last 72 hours. Recent Labs    01/12/23 0801 01/13/23 0608  LABPROT 28.2* 22.6*  INR 2.6* 2.0*      Past Medical History:  Diagnosis Date   Abscess of back    Arthritis    Bacterial infection 2015   Cancer (HCC)    skin    Diabetes mellitus without complication (HCC)     Dysrhythmia    Edema    RIGHT ANKLE   GERD (gastroesophageal reflux disease)    Hypertension    Trigeminal nerve disorder    Trigeminal neuralgia     Past Surgical History:  Procedure Laterality Date   APPENDECTOMY  2010   CATARACT EXTRACTION W/PHACO Right 01/26/2017   Procedure: CATARACT EXTRACTION PHACO AND INTRAOCULAR LENS PLACEMENT (IOC);  Surgeon: Nevada Crane, MD;  Location: [facility] ORS;  Service: Ophthalmology;  Laterality: Right;  Lot # [id number] H [place]: 00:48.2 AP%: 9.9 CDE: 5.35   I&D of back abscess     TRIGEMINAL NERVE DECOMPRESSION      Family History  Problem Relation Age of Onset   Heart disease Father     Prior to Admission medications   Medication Sig Start Date End Date Taking? Authorizing Provider  acetaminophen (TYLENOL) 500 MG tablet Take 1,000 mg by mouth every 6 (six) hours as needed.    [provider]  amLODipine (NORVASC) 5 MG tablet Take 1 tablet by mouth daily. 05/23/22   [provider]  Apoaequorin 10 MG CAPS Take 1 capsule by mouth daily. Prevagen    [provider]  aspirin EC 81 MG tablet Take 325 mg by mouth daily.    [provider]  carbamazepine (CARBATROL) 200 MG 12 hr capsule Take 200 mg by mouth 4 (four) times daily.    [provider]  cholestyramine light (PREVALITE) 4 GM/DOSE powder Take 4 g by mouth 2 (two) times daily with a meal. 12/01/22   [provider]  gabapentin (NEURONTIN) 600 MG tablet Take 600 mg by mouth 3 (three) times daily. 12/19/22   [provider]  glucagon (GLUCAGON EMERGENCY) 1 MG injection Inject 1 mg into the skin. 06/18/13   [provider]  insulin aspart (NOVOLOG) 100 UNIT/ML injection Inject 3 Units into the skin 3 (three) times daily with meals. 01/09/23   [place], Eric J, DO  insulin glargine-yfgn (SEMGLEE) 100 UNIT/ML injection Inject 0.05 mLs (5 Units total) into the skin daily. 01/10/23   [place], Alvira Philips, DO  metroNIDAZOLE (FLAGYL) 500 MG  tablet Take 1 tablet (500 mg total) by mouth every 12 (twelve) hours. 01/09/23   [place], Alvira Philips, DO  Multiple Vitamin (MULTI-VITAMIN) tablet Take 1 tablet by mouth daily.    [provider]  omeprazole (PRILOSEC) 20 MG capsule Take 20 mg by mouth daily.    [provider]  rosuvastatin (CRESTOR) 10 MG tablet Take 1 tablet by mouth daily. 11/24/22   [provider]    Current Facility-Administered Medications  Medication Dose Route Frequency Provider  Last Rate Last Admin   acarbose (PRECOSE) tablet 25 mg  25 mg Oral TID WC Raulkar, Drema Pry, MD   25 mg at 01/13/23 1220   acetaminophen (TYLENOL) tablet 325-650 mg  325-650 mg Oral Q4H PRN Milinda Antis, PA-C       alum & mag hydroxide-simeth (MAALOX/MYLANTA) 200-200-20 MG/5ML suspension 30 mL  30 mL Oral Q4H PRN Milinda Antis, PA-C   30 mL at 01/13/23 1136   amLODipine (NORVASC) tablet 5 mg  5 mg Oral Daily Milinda Antis, PA-C   5 mg at 01/13/23 0913   bisacodyl (DULCOLAX) suppository 10 mg  10 mg Rectal Daily PRN Milinda Antis, PA-C       carbamazepine (TEGRETOL XR) 12 hr tablet 200 mg  200 mg Oral QID Milinda Antis, PA-C   200 mg at 01/13/23 1214   diphenhydrAMINE (BENADRYL) capsule 25 mg  25 mg Oral Q6H PRN Milinda Antis, PA-C       gabapentin (NEURONTIN) capsule 600 mg  600 mg Oral TID Milinda Antis, PA-C   600 mg at 01/13/23 1220   guaiFENesin-dextromethorphan (ROBITUSSIN DM) 100-10 MG/5ML syrup 10 mL  10 mL Oral Q6H PRN Milinda Antis, PA-C       insulin aspart (novoLOG) injection 0-5 Units  0-5 Units Subcutaneous QHS Milinda Antis, PA-C   2 Units at 01/12/23 2210   insulin aspart (novoLOG) injection 0-9 Units  0-9 Units Subcutaneous TID WC Milinda Antis, PA-C   3 Units at 01/13/23 1220   insulin aspart (novoLOG) injection 3 Units  3 Units Subcutaneous TID WC Milinda Antis, PA-C   3 Units at 01/13/23 1221   insulin glargine-yfgn (SEMGLEE) injection 6 Units  6 Units Subcutaneous Daily  Raulkar, Drema Pry, MD   6 Units at 01/13/23 0916   losartan (COZAAR) tablet 25 mg  25 mg Oral Daily Raulkar, Drema Pry, MD   25 mg at 01/13/23 0913   magnesium gluconate (MAGONATE) tablet 500 mg  500 mg Oral QHS Raulkar, Drema Pry, MD   500 mg at 01/12/23 2209   methocarbamol (ROBAXIN) tablet 500 mg  500 mg Oral Q6H PRN Milinda Antis, PA-C       ondansetron [facility]) tablet 4 mg  4 mg Oral Q6H PRN Milinda Antis, PA-C       Or   ondansetron [facility]) injection 4 mg  4 mg Intravenous Q6H PRN Milinda Antis, PA-C       oxyCODONE (Oxy IR/ROXICODONE) immediate release tablet 5 mg  5 mg Oral Q4H PRN Milinda Antis, PA-C   5 mg at 01/12/23 2208   pantoprazole (PROTONIX) EC tablet 40 mg  40 mg Oral Daily Milinda Antis, PA-C   40 mg at 01/13/23 0913   polyethylene glycol (MIRALAX / GLYCOLAX) packet 17 g  17 g Oral Daily PRN Milinda Antis, PA-C       rosuvastatin (CRESTOR) tablet 10 mg  10 mg Oral Daily Milinda Antis, PA-C   10 mg at 01/13/23 0913   sodium chloride flush (NS) 0.9 % injection 10-40 mL  10-40 mL Intracatheter PRN Milinda Antis, PA-C       sodium phosphate (FLEET) enema 1 enema  1 enema Rectal Once PRN Valetta Fuller Lynnell Jude, PA-C       Warfarin - Pharmacist Dosing Inpatient   Does not apply [id number] Milinda Antis, PA-C   Given at 01/12/23 1719    Allergies as  of 01/09/2023   (No Known Allergies)    Social History   Socioeconomic History   Marital status: Married    Spouse name: Not on file   Number of children: Not on file   Years of education: Not on file   Highest education level: Not on file  Occupational History   Not on file  Tobacco Use   Smoking status: Never   Smokeless tobacco: Never  Vaping Use   Vaping status: Never Used  Substance and Sexual Activity   Alcohol use: No    Alcohol/week: 0.0 standard drinks of alcohol   Drug use: No   Sexual activity: Not on file  Other Topics Concern   Not on file  Social History Narrative   Not on file    Social Determinants of Health   Financial Resource Strain: Patient Declined (11/16/2022)   Received from [employer] [facility]   Overall Financial Resource Strain (CARDIA)    Difficulty of Paying Living Expenses: Patient declined  Food Insecurity: No Food Insecurity (01/09/2023)   Hunger Vital Sign    Worried About Running Out of Food in the Last Year: Never true    Ran Out of Food in the Last Year: Never true  Transportation Needs: No Transportation Needs (01/09/2023)   PRAPARE - Administrator, Civil Service (Medical): No    Lack of Transportation (Non-Medical): No  Physical Activity: Not on file  Stress: Not on file  Social Connections: Not on file  Intimate Partner Violence: Not At Risk (01/09/2023)   Humiliation, Afraid, Rape, and Kick questionnaire    Fear of Current or Ex-Partner: No    Emotionally Abused: No    Physically Abused: No    Sexually Abused: No     Code Status   Code Status: Full Code  Review of Systems: All systems reviewed and negative except where noted in HPI.  Physical Exam: Vital signs in last 24 hours: Temp:  [98.2 F (36.8 C)-99.1 F (37.3 C)] 98.2 F (36.8 C) (11/15 0511) Pulse Rate:  [70-80] 70 (11/15 0511) Resp:  [17] 17 (11/15 0511) BP: (137-138)/(55-80) 137/80 (11/15 0511) SpO2:  [97 %] 97 % (11/15 0511) Weight:  [84.9 kg] 84.9 kg (11/15 1200) Last BM Date : 01/11/23  General:  Pleasant male in NAD Psych:  Cooperative. Normal mood and affect Eyes: Pupils equal Ears:  Normal auditory acuity Nose: No deformity, discharge or lesions Neck:  Supple, no masses felt Lungs:  Clear to auscultation.  Heart:  Regular rate, regular rhythm.  Abdomen:  Soft, nondistended, nontender, active bowel sounds, no masses felt Rectal :  Deferred Msk: Symmetrical without gross deformities.  Neurologic:  Alert, oriented, grossly normal neurologically Extremities : No edema Skin:  Intact without significant lesions.     Intake/Output from previous day: 11/14 0701 - 11/15 0700 In: 600 [P.O.:600] Out: -  Intake/Output this shift:  Total I/O In: 240 [P.O.:240] Out: -    Willette Cluster, NP-C   01/13/2023, 2:04 PM

## 2023-01-13 NOTE — Progress Notes (Signed)
Physical Therapy Session Note  Patient Details  Name: Larry Lane MRN: [id number] Date of Birth: 1940-05-28  Today's Date: 01/13/2023 PT Individual Time: 0800-0845  PT Individual Time Calculation (min): 45 min  Short Term Goals: Week 1:  PT Short Term Goal 1 (Week 1): STG = LTG due to ELOS  Skilled Therapeutic Interventions/Progress Updates:     Pt sitting in w/c on arrival - no reports of resting pain. Pt dressed and ready for therapy.  Transported to main rehab gym for time management.   Sit<>stand to RW with supervision from w/c height. Ambulates with CGA and RW from main gym to ADL apartment, ~194ft. Cues for forward gaze, increasing weight shift to his LLE. No knee buckling while ambulating.   Furniture transfer completed from couch in ADL apartment with supervision and RW - cues for hand placement only. Ambulated back to main rehab gym with similar assist and cues as above.   Standing there-ex in // bars for bilateral LE strengthening: -1x20 heel raise -1x15 alternating high knee marching *pt with LARGE buckling of LLE during high knee marching, needing max/totalA for recovery to prevent fall.  -1x15 mini squat/knee bends -1x30 hip abd/add  -2x30 TKE on LLE with yellow TB resistance for quad activation/strengthening -1x15 repeated sit<>stands in // bars (relying on UE to pull himself to stand).   Returned to his room and patient left sitting upright in w/c. All needs met at end of treatment.     2nd session: Pt just returning from getting CT imaging done. Patient reports fatigue and politely refusing PM session, requesting to rest. Missed 30 minutes of therapy.      Therapy Documentation Precautions:  Precautions Precautions: Fall Precaution Comments: L knee buckle Restrictions Weight Bearing Restrictions: No General:    Therapy/Group: Individual Therapy  Orrin Brigham 01/13/2023, 8:19 AM

## 2023-01-13 NOTE — Progress Notes (Signed)
Recreational Therapy Session Note  Patient Details  Name: Larry Lane MRN: [id number] Date of Birth: [birth year] Today's Date: 01/13/2023   No c/o pain.  Pt participated in animal assisted activity seated w/c level with supervision.  Pt enjoyed this visit.    Rodrickus Min 01/13/2023, 12:32 PM

## 2023-01-13 NOTE — Plan of Care (Signed)
  Problem: Consults Goal: RH GENERAL PATIENT EDUCATION Description: See Patient Education module for education specifics. 01/13/2023 1529 by Milana Kidney, RN Outcome: Progressing 01/13/2023 1527 by Milana Kidney, RN Outcome: Progressing   Problem: RH BOWEL ELIMINATION Goal: RH STG MANAGE BOWEL WITH ASSISTANCE Description: STG Manage Bowel with supervision Assistance. 01/13/2023 1529 by Milana Kidney, RN Outcome: Progressing 01/13/2023 1527 by Milana Kidney, RN Outcome: Progressing Goal: RH STG MANAGE BOWEL W/MEDICATION W/ASSISTANCE Description: STG Manage Bowel with Medication with supervision Assistance. 01/13/2023 1529 by Milana Kidney, RN Outcome: Progressing 01/13/2023 1527 by Milana Kidney, RN Outcome: Progressing   Problem: RH SKIN INTEGRITY Goal: RH STG SKIN FREE OF INFECTION/BREAKDOWN Description: Skin to remain intact and free of breakdown/infection with min assist  01/13/2023 1529 by Milana Kidney, RN Outcome: Progressing 01/13/2023 1527 by Milana Kidney, RN Outcome: Progressing Goal: RH STG MAINTAIN SKIN INTEGRITY WITH ASSISTANCE Description: STG Maintain Skin Integrity With cueing Assistance. 01/13/2023 1529 by Milana Kidney, RN Outcome: Progressing 01/13/2023 1527 by Milana Kidney, RN Outcome: Progressing   Problem: RH SAFETY Goal: RH STG ADHERE TO SAFETY PRECAUTIONS W/ASSISTANCE/DEVICE Description: STG Adhere to Safety Precautions With cues Assistance/Device. 01/13/2023 1529 by Milana Kidney, RN Outcome: Progressing 01/13/2023 1527 by Milana Kidney, RN Outcome: Progressing Goal: RH STG DECREASED RISK OF FALL WITH ASSISTANCE Description: STG Decreased Risk of Fall With supervision Assistance. 01/13/2023 1529 by Milana Kidney, RN Outcome: Progressing 01/13/2023 1527 by Milana Kidney, RN Outcome: Progressing   Problem: RH PAIN MANAGEMENT Goal: RH STG PAIN MANAGED AT OR BELOW PT'S PAIN GOAL Description: Less than 3 with PRN medications min assist   01/13/2023 1529 by Milana Kidney, RN Outcome: Progressing 01/13/2023 1527 by Milana Kidney, RN Outcome: Progressing   Problem: RH KNOWLEDGE DEFICIT GENERAL Goal: RH STG INCREASE KNOWLEDGE OF SELF CARE AFTER HOSPITALIZATION Description: Patient/caregiver will be able to manage medications, self care, and diet/lifestyle modifications to improve A1C and overall health from nursing education and nursing handouts independently  01/13/2023 1529 by Milana Kidney, RN Outcome: Progressing 01/13/2023 1527 by Milana Kidney, RN Outcome: Progressing

## 2023-01-13 NOTE — Progress Notes (Addendum)
PROGRESS NOTE   Subjective/Complaints: No new complaints this morning Discussed that MRI left femur is still in process Discussed CBGs still above 200, discussed acarbose  ROS: +left sided lower extremity weakness and sensory deficit, +trigeminal neuralgia   Objective:   No results found. Recent Labs    01/12/23 0801 01/13/23 0608  WBC 12.1* 8.4  HGB 9.8* 8.6*  HCT 29.5* 26.9*  PLT 471* 463*   No results for input(s): "NA", "K", "CL", "CO2", "GLUCOSE", "BUN", "CREATININE", "CALCIUM" in the last 72 hours.   Intake/Output Summary (Last 24 hours) at 01/13/2023 1049 Last data filed at 01/13/2023 0726 Gross per 24 hour  Intake 720 ml  Output --  Net 720 ml        Physical Exam: Vital Signs Blood pressure 137/80, pulse 70, temperature 98.2 F (36.8 C), resp. rate 17, SpO2 97%. Gen: no distress, normal appearing HEENT: oral mucosa pink and moist, NCAT Cardio: Reg rate Chest: normal effort, normal rate of breathing Abd: soft, non-distended Ext: no edema Psych: pleasant, normal affect Skin: intact Neuro: Alert and oriented x3 Musculoskeletal: 5/5 except for absent left knee extension, impaired hip flexion, and decreased sensation of left thigh. Walking with S with RW, exam stable 11/15  Assessment/Plan: 1. Functional deficits which require 3+ hours per day of interdisciplinary therapy in a comprehensive inpatient rehab setting. Physiatrist is providing close team supervision and 24 hour management of active medical problems listed below. Physiatrist and rehab team continue to assess barriers to discharge/monitor patient progress toward functional and medical goals  Care Tool:  Bathing    Body parts bathed by patient: Face, Right upper leg, Left upper leg, Right arm, Left arm, Chest, Abdomen, Front perineal area   Body parts bathed by helper: Right lower leg, Left lower leg, Buttocks     Bathing assist  Assist Level: Minimal Assistance - Patient > 75%     Upper Body Dressing/Undressing Upper body dressing   What is the patient wearing?: Pull over shirt    Upper body assist Assist Level: Set up assist    Lower Body Dressing/Undressing Lower body dressing      What is the patient wearing?: Underwear/pull up, Pants     Lower body assist Assist for lower body dressing: Moderate Assistance - Patient 50 - 74%     Toileting Toileting    Toileting assist Assist for toileting: Minimal Assistance - Patient > 75%     Transfers Chair/bed transfer  Transfers assist     Chair/bed transfer assist level: Contact Guard/Touching assist     Locomotion Ambulation   Ambulation assist      Assist level: Minimal Assistance - Patient > 75% Assistive device: Walker-rolling Max distance: 145'   Walk 10 feet activity   Assist     Assist level: Minimal Assistance - Patient > 75% Assistive device: Walker-rolling   Walk 50 feet activity   Assist    Assist level: Minimal Assistance - Patient > 75% Assistive device: Walker-rolling    Walk 150 feet activity   Assist Walk 150 feet activity did not occur: Safety/medical concerns         Walk 10 feet on uneven surface  activity   Assist Walk 10 feet on uneven surfaces activity did not occur: Safety/medical concerns         Wheelchair     Assist Is the patient using a wheelchair?: Yes Type of Wheelchair: Manual    Wheelchair assist level: Dependent - Patient 0%      Wheelchair 50 feet with 2 turns activity    Assist        Assist Level: Dependent - Patient 0%   Wheelchair 150 feet activity     Assist      Assist Level: Dependent - Patient 0%   Blood pressure 137/80, pulse 70, temperature 98.2 F (36.8 C), resp. rate 17, SpO2 97%.  Medical Problem List and Plan: 1. Functional deficits secondary to debility 2/2 sepsis             -patient may shower             -ELOS/Goals: 8 days  mod I   2.  Antithrombotics: -DVT/anticoagulation:  Pharmaceutical: Coumadin             -antiplatelet therapy: none   3. Pain Management: Tylenol, oxycodone 5 mg q 4 hrs prn as needed             -gabapentin 600 mg TID   4. Mood/Behavior/Sleep: LCSW to evaluate and provide emotional support             -trazodone 100 mg q HS prn             -antipsychotic agents: n/a   5. Neuropsych/cognition: This patient is not capable of making decisions on his own behalf.   6. Skin/Wound Care: Routine skin care checks   7. Fluids/Electrolytes/Nutrition: Routine Is and Os and follow-up chemistries             -carb modified diet   8: Hypertension: monitor TID and prn. Restart losartan 25mg  daily. Increase magnesium to 500mg  HS, klor one time ordered 11/12             -continue Norvasc 5 mg daily   9: Hyperlipidemia: continue statin   10: DM-2 CBGs QID; A1c = 7.6% -increase semglee to 6U  -continue SSI -continue Novolog 3 units TID -acarbose added TID   11: Trigeminal neuralgia:  -continue Tegretol XR 200 mg QID -continue gabapentin 600 mg TID  -discussed his history of treatment   12: Acute PE: continue Coumadin, dosing per pharmacy consult. CTA ordered given questionable findings on prior imaging.    13: GERD: continue PPI   14: Anemia: stool occult positive, repeat also resulted positive, Hgb reviewed and decreased 11/15, CI consulted, will repeat CTA as PE seemed to be questionable on prior imaging, assess for need for continued warfarin  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 MRI left femur ordered and consistent with denervation, discussed with Pam and asked her to update patient with results.    LOS: 4 days A FACE TO FACE EVALUATION WAS PERFORMED  Drema Pry Asa Baudoin 01/13/2023, 10:49 AM

## 2023-01-13 NOTE — Progress Notes (Signed)
PHARMACY - ANTICOAGULATION CONSULT NOTE  Pharmacy Consult for warfarin Indication: pulmonary embolus suspected, high probability  No Known Allergies  Patient Measurements: Height: 5\' 6"  (167.6 cm) Weight: 84.9 kg (187 lb 2.7 oz) IBW/kg (Calculated) : 63.8  Vital Signs: Temp: 98.2 F (36.8 C) (11/15 0511) BP: 137/80 (11/15 0511) Pulse Rate: 70 (11/15 0511)  Labs: Recent Labs    01/11/23 0547 01/12/23 0801 01/13/23 0608  HGB 9.1* 9.8* 8.6*  HCT 27.2* 29.5* 26.9*  PLT 369 471* 463*  LABPROT 22.9* 28.2* 22.6*  INR 2.0* 2.6* 2.0*    Estimated Creatinine Clearance: 72.8 mL/min (by C-G formula based on SCr of 0.84 mg/dL).  Assessment:  82 y/o M with CT angiogram chest with questionable left upper lobe/left lower lobe pulmonary emboli w/RHS. Not on anticoagulation prior to inpatient admission. Pharmacy consulted to manage heparin drip on 01/04/23 and then warfarin on 01/07/23. Heparin drip was stopped on 01/09/23 when INR therapeutic.  Had been sensitive to warfarin doses while on metronidazole, which completed 11/12. On carbamazepine as prior to admit (potentially decreases INR)  INR 2.6 on 11/14 > low warfarin dose on 0.5 mg given and INR down to 2.0. Low therapeutic. FOB + today.  Per Dr. Carlis Abbott, plan GI consult, hold warfarin for now. On protonix 40 mg PO daily.  For repeat CTA today.   Goal of Therapy:  INR 2-3 Monitor platelets by anticoagulation protocol: Yes   Plan:  Hold warfarin until evaluated by GI per Dr. Carlis Abbott. Follow up repeat CTA. Continue daily PT/INR.  Dennie Fetters, RPh 01/13/2023,12:43 PM

## 2023-01-13 NOTE — Progress Notes (Signed)
Physical Therapy Session Note  Patient Details  Name: Larry Lane MRN: [id number] Date of Birth: August 27, 1940  Today's Date: 01/13/2023 PT Individual Time: 1305-1400 PT Individual Time Calculation (min): 55 min   Short Term Goals: Week 1:  PT Short Term Goal 1 (Week 1): STG = LTG due to ELOS  Skilled Therapeutic Interventions/Progress Updates:    Pt received sitting in w/c with his wife present and pt agreeable to therapy session. Donned socks and shoes total A for time management - pt with pitting edema in B LEs but states this is baseline for his whole life.  Trans[facility]from [facility] in w/c for time management and energy conservation.   Sit<>stands using RW with CGA for safety throughout session.  Participated in gait training in community environment using RW ~17ft with CGA navigating aroud tables in atrium - reciprocal stepping pattern, no instances of L knee instability, adequate gait speed, slight forward trunk flexed posture.  Transported outside in w/c.  Pt unable to lift L LE in sitting due to lack of hip flexion muscle activation requiring compensation from hands to pick foot up from w/c leg rest. Performed repeated sitting L hip flexion to place foot on/off foot plate with therapist assistance for strengthening x10reps Pt demos lack of quad activation, only palpable muscle activation and no ability to extend knee from seated position.  Gait training outside on paved, minimally unlevel ground using RW with CGA ~154ft x2 - reciprocal stepping pattern, decreased gait speed, min forward trunk flexed posture, and 1x L knee minimally buckled with pt able to recover with increased to min A for safety but not necessarily needed.   Standing with B UE support on RW performed x15 reps L hip flexion in standing with pt demoing ability to achieve partial AROM from a hip extended position.  Nurse notified therapist of pt order for STAT CT so transported pt back up to  CIR.  Pam, PA present to discuss CT and therapist provided input on pt's L LE sensory and strength deficits. Short distance ~38ft ambulatory transfer w/c>EOB using RW with CGA.   Sit>supine with min A for L LE management onto bed. Pt left supine in bed with needs in reach and bed alarm on - nurse aware of pt's position.  Therapy Documentation Precautions:  Precautions Precautions: Fall Precaution Comments: L knee buckle Restrictions Weight Bearing Restrictions: No   Pain:  No reports of pain throughout session.    Therapy/Group: Individual Therapy  Ginny Forth , PT, DPT, NCS, CSRS 01/13/2023, 12:54 PM

## 2023-01-13 NOTE — Plan of Care (Signed)
  Problem: Consults Goal: RH GENERAL PATIENT EDUCATION Description: See Patient Education module for education specifics. Outcome: Progressing   Problem: RH BOWEL ELIMINATION Goal: RH STG MANAGE BOWEL WITH ASSISTANCE Description: STG Manage Bowel with supervision Assistance. Outcome: Progressing Goal: RH STG MANAGE BOWEL W/MEDICATION W/ASSISTANCE Description: STG Manage Bowel with Medication with supervision Assistance. Outcome: Progressing   Problem: RH SKIN INTEGRITY Goal: RH STG SKIN FREE OF INFECTION/BREAKDOWN Description: Skin to remain intact and free of breakdown/infection with min assist  Outcome: Progressing Goal: RH STG MAINTAIN SKIN INTEGRITY WITH ASSISTANCE Description: STG Maintain Skin Integrity With cueing Assistance. Outcome: Progressing   Problem: RH SAFETY Goal: RH STG ADHERE TO SAFETY PRECAUTIONS W/ASSISTANCE/DEVICE Description: STG Adhere to Safety Precautions With cues Assistance/Device. Outcome: Progressing Goal: RH STG DECREASED RISK OF FALL WITH ASSISTANCE Description: STG Decreased Risk of Fall With supervision Assistance. Outcome: Progressing   Problem: RH PAIN MANAGEMENT Goal: RH STG PAIN MANAGED AT OR BELOW PT'S PAIN GOAL Description: Less than 3 with PRN medications min assist  Outcome: Progressing   Problem: RH KNOWLEDGE DEFICIT GENERAL Goal: RH STG INCREASE KNOWLEDGE OF SELF CARE AFTER HOSPITALIZATION Description: Patient/caregiver will be able to manage medications, self care, and diet/lifestyle modifications to improve A1C and overall health from nursing education and nursing handouts independently  Outcome: Progressing

## 2023-01-13 NOTE — Progress Notes (Signed)
Occupational Therapy Session Note  Patient Details  Name: Larry Lane MRN: [id number] Date of Birth: Sep 16, 1940  Today's Date: 01/13/2023 OT Individual Time: 3557-3220 OT Individual Time Calculation (min): 70 min    Short Term Goals: Week 1:  OT Short Term Goal 1 (Week 1): STG=LTG based on LOS  Skilled Therapeutic Interventions/Progress Updates:   Pt greeted seated in w/c, pt agreeable to OT intervention.    Wife present at beginning of session   Transfers/bed mobility: pt completed sit>stands with RW and CGA from w/c.   Therapeutic activity: pt completed ambulatory therapeutic activity with pt engaging in obstacle course with an emphasis on managing RW in tight spaces while transporting items from one area>other to simulate IADL tasks and challenge dynamic reaching. Pt completed task with CGA. Pt needed seated rest break half way through d/t fatigue.   Pt completed dynamic balance task with pt instructed to step up on airex cushion reach dynamically with RUE to transport cones across midline with unilateral support. Pt completed task with CGA.   Pt completed dynamic balance task with pt standing on airex cushion to complete pipe tree with an emphasis on balancing with no UE support, pt completed task with CGA. Pt able to stand for > 5 mins to complete task.   Worked on reciprocal stepping with pt stepping up onto 3.5 inch step for LB strengthening. Pt needed BUE support from RW when completing steps did have one moment of buckling in RLE.     Ended session with pt seated in w/c with all needs within reach and safety belt alarm activated.                   Therapy Documentation Precautions:  Precautions Precautions: Fall Precaution Comments: L knee buckle Restrictions Weight Bearing Restrictions: No  Pain: No pain reported during session    Therapy/Group: Individual Therapy  Barron Schmid 01/13/2023, 12:22 PM

## 2023-01-13 NOTE — Progress Notes (Signed)
Discussed heme positive stools, drop in H/H and issues with abdominal discomfort.   Discussed repeat CT negative for PE with Rahul/PCCM APP.  He reviewed chart and felt that coumadin could be d/c as CTA negative. Hold today, repeat limited echo tomorrow and if this is normal, would discontinue warfarin. Will alert pharmacy, GI and weekend call provider.

## 2023-01-14 DIAGNOSIS — R195 Other fecal abnormalities: Secondary | ICD-10-CM | POA: Diagnosis not present

## 2023-01-14 DIAGNOSIS — K922 Gastrointestinal hemorrhage, unspecified: Secondary | ICD-10-CM

## 2023-01-14 DIAGNOSIS — I2699 Other pulmonary embolism without acute cor pulmonale: Secondary | ICD-10-CM

## 2023-01-14 DIAGNOSIS — Z794 Long term (current) use of insulin: Secondary | ICD-10-CM

## 2023-01-14 DIAGNOSIS — D62 Acute posthemorrhagic anemia: Secondary | ICD-10-CM | POA: Diagnosis not present

## 2023-01-14 DIAGNOSIS — A419 Sepsis, unspecified organism: Secondary | ICD-10-CM | POA: Diagnosis not present

## 2023-01-14 DIAGNOSIS — K529 Noninfective gastroenteritis and colitis, unspecified: Secondary | ICD-10-CM | POA: Diagnosis not present

## 2023-01-14 DIAGNOSIS — S7412XD Injury of femoral nerve at hip and thigh level, left leg, subsequent encounter: Secondary | ICD-10-CM | POA: Diagnosis not present

## 2023-01-14 DIAGNOSIS — D649 Anemia, unspecified: Secondary | ICD-10-CM | POA: Diagnosis not present

## 2023-01-14 DIAGNOSIS — E119 Type 2 diabetes mellitus without complications: Secondary | ICD-10-CM

## 2023-01-14 LAB — GLUCOSE, CAPILLARY
Glucose-Capillary: 150 mg/dL — ABNORMAL HIGH (ref 70–99)
Glucose-Capillary: 245 mg/dL — ABNORMAL HIGH (ref 70–99)
Glucose-Capillary: 207 mg/dL — ABNORMAL HIGH (ref 70–99)
Glucose-Capillary: 149 mg/dL — ABNORMAL HIGH (ref 70–99)

## 2023-01-14 LAB — CBC
HCT: 27.6 % — ABNORMAL LOW (ref 39.0–52.0)
Hemoglobin: 9 g/dL — ABNORMAL LOW (ref 13.0–17.0)
MCH: 30.6 pg (ref 26.0–34.0)
MCHC: 32.6 g/dL (ref 30.0–36.0)
MCV: 93.9 fL (ref 80.0–100.0)
Platelets: 503 10*3/uL — ABNORMAL HIGH (ref 150–400)
RBC: 2.94 MIL/uL — ABNORMAL LOW (ref 4.22–5.81)
RDW: 12.9 % (ref 11.5–15.5)
WBC: 7.3 10*3/uL (ref 4.0–10.5)
nRBC: 0 % (ref 0.0–0.2)

## 2023-01-14 LAB — PROTIME-INR
INR: 1.6 — ABNORMAL HIGH (ref 0.8–1.2)
Prothrombin Time: 19 s — ABNORMAL HIGH (ref 11.4–15.2)

## 2023-01-14 NOTE — Progress Notes (Signed)
Physical Therapy Session Note  Patient Details  Name: Larry Lane MRN: [id number] Date of Birth: 06/29/1940  Today's Date: 01/14/2023 PT Individual Time: 0820-0906; 1010 - 1120 PT Individual Time Calculation (min): 46 min; 70 min  Short Term Goals: Week 1:  PT Short Term Goal 1 (Week 1): STG = LTG due to ELOS  SESSION 1 Skilled Therapeutic Interventions/Progress Updates: Patient supine in bed with wife present on entrance to room. Patient alert and agreeable to PT session. Pt requested privacy while pt voided bladder and donned personal clothing with help from wife (missed minutes indicated).   Patient reported no pain, only some stomach pain (medical team aware - unrated pain).   Therapeutic Activity: Bed Mobility: Pt performed supine<>sit on EOB with supervision (HOB elevated). Transfers: Pt performed sit<>stand transfers throughout session with RW and with close supervision. Provided VC for anterior scoot, increase forward weight shift, and to push off surface with one UE vs B UE on RW. Pt cued to avoid using UE to elevate L LE onto leg rest. Pt noted to have some hip flexor/quadriceps activation to perform task and required minA to elevate throughout session. Pt transported to day room gym and main gym dependently in [facility] for energy conservation.   - Pt with curb navigation in RW with CGA and no buckling on L LE. Pt with VC to step up close to curb and sequence of B LE's (up with R, down with L). Pt performed x 2 with no LOB  Therapeutic Exercise: Pt performed the following exercises with therapist providing the described cuing and facilitation for improvement. - Pt performed x 10 step to with L LE and with 4lb ankle weight donned (pt initially started at 3" step with no weight and performed 3 reps, then progressed to 6" with no ankle weight and performed x 3, then to 4lb ankle weight). Pt with CGA/light CGA for safety. Pt presented with slightly excessive R lean in order to increase  clearance to L LE. Pt cued to avoid sliding L LE off of step, and to instead increase L hip flexion to clear step (pt unable to perform task without sliding). Pt with B UE support on hand railing throughout.  Patient sitting in WC at end of session with brakes locked, wife present, and all needs within reach.  SESSION 2 Skilled Therapeutic Interventions/Progress Updates: Patient sitting in WC on entrance to room. Patient alert and agreeable to PT session.   Patient reported no pain at beginning of session.  Pt wife updated on pt's progress and educated on e-stim/contract-relax with quick stretch rationale at end of session with wife understanding. Pt and wife with no further questions.   Therapeutic Activity: Transfers: Pt performed sit<>stand transfers throughout session with RW and with supervision. Pt transported from room<>main gym in [facility] dependently for energy conservation.   Neuromuscular Re-ed: NMR facilitated during session with focus on increasing neuromuscular activation in L LE quadriceps. Pt provided with education for rationale in performing below tasks with pt understanding. Pt noted to have deep pressure sensory in upper quadriceps muscle belly, and light pressure mid-belly, and light touch on distal thigh.  - Contract-Relax PNF on L LE quadriceps (quick stretch to increase muscle fiber recruitment and neuro muscular connection - pt cued to increase visual stimulation toward L quadriceps) while pt short sitting (LAQ). PTA providing light min resistance with pt noting to have very slight increase in quadriceps strength after several reps. Pt performed 2 rounds prior to E-stim   -  Applied L LE electrical stimulation to VMO and mid quadriceps belly muscles with pt supine and LE on top of bolster using [place] e-stim device set on NMES L - intensity 52 with mild palpable muscle contraction on lateral quadricept - after completion of e-stim, pt denies any negative side effects with skin  intact and no adverse side effects noted. PTA provided maxA to assist pt into SAQ during on-time due to decrease in quadriceps activation.  - Pt then sitting edge of mat performing LAQ with slight increase in ROM compared to contract-relax NMRE earlier (PTA hand stopping pt from using momentum).   NMR performed for improvements in motor control and coordination, balance, sequencing, judgement, and self confidence/ efficacy in performing all aspects of mobility at highest level of independence.   Patient sitting in WC at end of session with brakes locked, wife present, and all needs within reach.       Therapy Documentation Precautions:  Precautions Precautions: Fall Precaution Comments: L knee buckle Restrictions Weight Bearing Restrictions: No   Therapy/Group: Individual Therapy  Dwight Burdo PTA 01/14/2023, 12:49 PM

## 2023-01-14 NOTE — Progress Notes (Signed)
PROGRESS NOTE   Subjective/Complaints: Spent extensive time reviewing imaging findings and medical plan with pt and wife. He reports improved sensation and movement in his left leg. No gross blood in stool. Denies sob, cp. Pt thought he was having a colonoscopy and egd today.  ROS: Patient denies fever, rash, sore throat, blurred vision, dizziness, nausea, vomiting, diarrhea, cough, shortness of breath or chest pain,   headache, or mood change.    Objective:   CT Angio Chest Pulmonary Embolism (PE) W or WO Contrast  Result Date: 01/13/2023 CLINICAL DATA:  Pulmonary embolism (PE) suspected, high prob. Follow-up previously questioned left upper and left lower lobe pulmonary emboli. EXAM: CT ANGIOGRAPHY CHEST WITH CONTRAST TECHNIQUE: Multidetector CT imaging of the chest was performed using the standard protocol during bolus administration of intravenous contrast. Multiplanar CT image reconstructions and MIPs were obtained to evaluate the vascular anatomy. RADIATION DOSE REDUCTION: This exam was performed according to the departmental dose-optimization program which includes automated exposure control, adjustment of the mA and/or kV according to patient size and/or use of iterative reconstruction technique. CONTRAST:  75mL OMNIPAQUE IOHEXOL 350 MG/ML SOLN COMPARISON:  CTA chest 01/04/2023. FINDINGS: Cardiovascular: Satisfactory opacification of the pulmonary arteries to the segmental level. No evidence of pulmonary embolism. Normal heart size. No pericardial effusion. Extensive coronary artery calcifications and atherosclerotic calcifications of the thoracic aorta. Mediastinum/Nodes: No enlarged mediastinal, hilar, or axillary lymph nodes. Thyroid gland, trachea, and esophagus demonstrate no significant findings. Lungs/Pleura: No consolidation or pulmonary edema. Trace left pleural effusion. No pneumothorax. Upper Abdomen: No acute abnormality.  Musculoskeletal: No chest wall abnormality. No acute or significant osseous findings. Review of the MIP images confirms the above findings. IMPRESSION: 1. No evidence of pulmonary embolism. 2. Trace left pleural effusion. 3. Extensive coronary artery calcifications. Aortic Atherosclerosis (ICD10-I70.0). Electronically Signed   By: Orvan Falconer M.D.   On: 01/13/2023 17:29   MR FEMUR LEFT WO CONTRAST  Result Date: 01/13/2023 CLINICAL DATA:  Left thigh pain and weakness. EXAM: MR OF THE LEFT FEMUR WITHOUT CONTRAST TECHNIQUE: Multiplanar, multisequence MR imaging of the left thigh was performed. No intravenous contrast was administered. COMPARISON:  Left femur x-rays dated January 06, 2023. FINDINGS: Bones/Joint/Cartilage No marrow signal abnormality. No fracture or dislocation. Mild bilateral hip degenerative changes. Prior left knee medial unicompartmental arthroplasty. Small bilateral knee joint effusions. Ligaments Collateral ligaments are intact. Muscles and Tendons Intact. Scattered muscle edema within the left thigh, predominantly involving the sartorius, vastus medialis, and vastus lateralis muscles. There is also involvement of the distal left iliopsoas muscle. Mild fatty infiltration of the left thigh muscles, with more focal atrophy of the adductor magnus and rectus femoris muscles. No significant muscle edema in the visualized right thigh on the coronal sequences. Soft tissue Mild thigh soft tissue swelling, greatest laterally. No fluid collection or hematoma. No soft tissue mass. Moderate right and trace left hydroceles. IMPRESSION: 1. Scattered muscle edema within the left thigh, predominantly involving the sartorius, vastus medialis, and vastus lateralis muscles. There is also involvement of the distal left iliopsoas muscle. Findings are nonspecific and may be related to myositis or denervation. 2. No acute osseous abnormality. Electronically Signed   By: Teresita Madura  Derry M.D.   On: 01/13/2023  12:12   Recent Labs    01/13/23 0608 01/14/23 0530  WBC 8.4 7.3  HGB 8.6* 9.0*  HCT 26.9* 27.6*  PLT 463* 503*   No results for input(s): "NA", "K", "CL", "CO2", "GLUCOSE", "BUN", "CREATININE", "CALCIUM" in the last 72 hours.   Intake/Output Summary (Last 24 hours) at 01/14/2023 1032 Last data filed at 01/13/2023 1847 Gross per 24 hour  Intake 480 ml  Output --  Net 480 ml        Physical Exam: Vital Signs Blood pressure (!) 155/73, pulse 80, temperature 97.8 F (36.6 C), resp. rate 17, height 5\' 6"  (1.676 m), weight 84.9 kg, SpO2 98%. Constitutional: No distress . Vital signs reviewed. HEENT: NCAT, EOMI, oral membranes moist Neck: supple Cardiovascular: RRR without murmur. No JVD    Respiratory/Chest: CTA Bilaterally without wheezes or rales. Normal effort    GI/Abdomen: BS +, non-tender, non-distended Ext: no clubbing, cyanosis, or edema Psych: pleasant and cooperative  Skin: intact Neuro: Alert and oriented x3 Musculoskeletal: 5/5 except for absent left knee extension, impaired hip flexion, and decreased sensation of left thigh--still present but improving!.  There is sl swelling in the left thigh.  Assessment/Plan: 1. Functional deficits which require 3+ hours per day of interdisciplinary therapy in a comprehensive inpatient rehab setting. Physiatrist is providing close team supervision and 24 hour management of active medical problems listed below. Physiatrist and rehab team continue to assess barriers to discharge/monitor patient progress toward functional and medical goals  Care Tool:  Bathing    Body parts bathed by patient: Face, Right upper leg, Left upper leg, Right arm, Left arm, Chest, Abdomen, Front perineal area   Body parts bathed by helper: Right lower leg, Left lower leg, Buttocks     Bathing assist Assist Level: Minimal Assistance - Patient > 75%     Upper Body Dressing/Undressing Upper body dressing   What is the patient wearing?: Pull over  shirt    Upper body assist Assist Level: Set up assist    Lower Body Dressing/Undressing Lower body dressing      What is the patient wearing?: Underwear/pull up, Pants     Lower body assist Assist for lower body dressing: Moderate Assistance - Patient 50 - 74%     Toileting Toileting    Toileting assist Assist for toileting: Minimal Assistance - Patient > 75%     Transfers Chair/bed transfer  Transfers assist     Chair/bed transfer assist level: Contact Guard/Touching assist     Locomotion Ambulation   Ambulation assist      Assist level: Minimal Assistance - Patient > 75% Assistive device: Walker-rolling Max distance: 145'   Walk 10 feet activity   Assist     Assist level: Minimal Assistance - Patient > 75% Assistive device: Walker-rolling   Walk 50 feet activity   Assist    Assist level: Minimal Assistance - Patient > 75% Assistive device: Walker-rolling    Walk 150 feet activity   Assist Walk 150 feet activity did not occur: Safety/medical concerns         Walk 10 feet on uneven surface  activity   Assist Walk 10 feet on uneven surfaces activity did not occur: Safety/medical concerns         Wheelchair     Assist Is the patient using a wheelchair?: Yes Type of Wheelchair: Manual    Wheelchair assist level: Dependent - Patient 0%      Wheelchair 50  feet with 2 turns activity    Assist        Assist Level: Dependent - Patient 0%   Wheelchair 150 feet activity     Assist      Assist Level: Dependent - Patient 0%   Blood pressure (!) 155/73, pulse 80, temperature 97.8 F (36.6 C), resp. rate 17, height 5\' 6"  (1.676 m), weight 84.9 kg, SpO2 98%.  Medical Problem List and Plan: 1. Functional deficits secondary to debility 2/2 sepsis             -patient may shower             -ELOS/Goals: 8 days mod I   -Continue CIR therapies including PT, OT  2.  Antithrombotics: -DVT/anticoagulation:   Pharmaceutical: Coumadin on hold (INR 1.6)             -antiplatelet therapy: none   3. Pain Management: Tylenol, oxycodone 5 mg q 4 hrs prn as needed             -gabapentin 600 mg TID   4. Mood/Behavior/Sleep: LCSW to evaluate and provide emotional support             -trazodone 100 mg q HS prn             -antipsychotic agents: n/a   5. Neuropsych/cognition: This patient is not capable of making decisions on his own behalf.   6. Skin/Wound Care: Routine skin care checks   7. Fluids/Electrolytes/Nutrition: Routine Is and Os and follow-up chemistries             -carb modified diet   8: Hypertension: monitor TID and prn. Restart losartan 25mg  daily. 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     -11/16 CTA reviewed and is negative for PE, discussed with patient and wife   13: GERD: continue PPI   14: Anemia: stool occult positive, repeat also resulted positive,   11/16 today's Hgb up to 9.0, no gross blood in stool. Pt appears comfortable   -CTA negative   -limited ECHO pending for today, If negative will permanently stop warfarin   -colonoscopy per GI, appreciate their help!  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 MRI left femur ordered and consistent with denervation  -I reviewed findings with pt and wife today  -he is already experiencing  improvement in the femoral nerve sx  -consider EMG/NCS as outpt depending upon how he's doing  -may try e-stim on thigh in therapy  At least 50 total minutes were spent in examination of patient, assessment of pertinent data,  formulation of a treatment plan, and in discussion with patient, family, and/or treating providers.      LOS: 5 days A FACE TO FACE EVALUATION WAS PERFORMED  Ranelle Oyster 01/14/2023,  10:32 AM

## 2023-01-14 NOTE — Progress Notes (Signed)
Occupational Therapy Session Note  Patient Details  Name: Larry Lane MRN: [id number] Date of Birth: Jul 14, 1940  Today's Date: 01/14/2023 OT Individual Time: 0215-0330 OT Individual Time Calculation (min): 75 min    Short Term Goals: Week 1:  OT Short Term Goal 1 (Week 1): STG=LTG based on LOS  Skilled Therapeutic Interventions/Progress Updates:    Patient seated at w/c LOF at the time of arrival. Patient indicated that he rested well, however, he reported a pain response of 6 on a 0-10 with the L leg throbbing in nature, nursing was made aware. Patient in agreement with completing UB exercsies to improve functional outcome. The pt was able to start the session completing a simulated task in LB dressing coming from sit to stand using the RW for additional balance to bring the theraband over his hips and bottom with CGA. The pt went on to complete UB exercises using theraband for shld flexion, horizontal abduction, shld rotation, and lg  circles to improve UB strength and endurance. The pt went on to complete a ball toss  exercise at w/c LOF, the pt was able to make adjustment in his static and dynamic sit balance while incorporating bilateral hand activity.  Next, the pt completed an oppositional force exercise using the 2lb dowel by holding the dowel in shld flexion and maintaining position 3x for a count of 10 to improve UB strength.  At the end of the session, the pt  remained at w/c LOF with his call light and beside table within reach and all additional needs addressed.   Therapy Documentation Precautions:  Precautions Precautions: Fall Precaution Comments: L knee buckle Restrictions Weight Bearing Restrictions: No Therapy/Group: Individual Therapy  Lavona Mound 01/14/2023, 3:41 PM

## 2023-01-14 NOTE — Progress Notes (Signed)
GASTROENTEROLOGY ROUNDING NOTE   Subjective: Patient feeling well today.  Had a bowel movement this morning which was loose in consistency but not bloody.  Hemoglobin stable. Repeat CT-PA was negative for pulmonary embolism.  Repeat echo has been ordered.  Last dose of Coumadin was November 14 at 1700.   Objective: Vital signs in last 24 hours: Temp:  [97.8 F (36.6 C)-98.1 F (36.7 C)] 97.8 F (36.6 C) (11/16 0602) Pulse Rate:  [74-80] 80 (11/16 0602) Resp:  [16-17] 17 (11/16 0602) BP: (126-155)/(56-73) 155/73 (11/16 0602) SpO2:  [98 %] 98 % (11/16 0602) Weight:  [84.9 kg] 84.9 kg (11/15 1200) Last BM Date : 01/13/23 General: NAD, pleasant Caucasian male seated in wheelchair, accompanied by wife and PT Lungs:  CTA b/l, no w/r/r Heart:  RRR, no m/r/g Abdomen:  Soft, NT, ND, +BS Ext:  No c/c/e    Intake/Output from previous day: 11/15 0701 - 11/16 0700 In: 720 [P.O.:720] Out: -  Intake/Output this shift: No intake/output data recorded.   Lab Results: Recent Labs    01/12/23 0801 01/13/23 0608 01/14/23 0530  WBC 12.1* 8.4 7.3  HGB 9.8* 8.6* 9.0*  PLT 471* 463* 503*  MCV 94.2 94.7 93.9   BMET No results for input(s): "NA", "K", "CL", "CO2", "GLUCOSE", "BUN", "CREATININE", "CALCIUM" in the last 72 hours. LFT No results for input(s): "PROT", "ALBUMIN", "AST", "ALT", "ALKPHOS", "BILITOT", "BILIDIR", "IBILI" in the last 72 hours. PT/INR Recent Labs    01/13/23 0608 01/14/23 0530  INR 2.0* 1.6*      Imaging/Other results: CT Angio Chest Pulmonary Embolism (PE) W or WO Contrast  Result Date: 01/13/2023 CLINICAL DATA:  Pulmonary embolism (PE) suspected, high prob. Follow-up previously questioned left upper and left lower lobe pulmonary emboli. EXAM: CT ANGIOGRAPHY CHEST WITH CONTRAST TECHNIQUE: Multidetector CT imaging of the chest was performed using the standard protocol during bolus administration of intravenous contrast. Multiplanar CT image  reconstructions and MIPs were obtained to evaluate the vascular anatomy. RADIATION DOSE REDUCTION: This exam was performed according to the departmental dose-optimization program which includes automated exposure control, adjustment of the mA and/or kV according to patient size and/or use of iterative reconstruction technique. CONTRAST:  75mL OMNIPAQUE IOHEXOL 350 MG/ML SOLN COMPARISON:  CTA chest 01/04/2023. FINDINGS: Cardiovascular: Satisfactory opacification of the pulmonary arteries to the segmental level. No evidence of pulmonary embolism. Normal heart size. No pericardial effusion. Extensive coronary artery calcifications and atherosclerotic calcifications of the thoracic aorta. Mediastinum/Nodes: No enlarged mediastinal, hilar, or axillary lymph nodes. Thyroid gland, trachea, and esophagus demonstrate no significant findings. Lungs/Pleura: No consolidation or pulmonary edema. Trace left pleural effusion. No pneumothorax. Upper Abdomen: No acute abnormality. Musculoskeletal: No chest wall abnormality. No acute or significant osseous findings. Review of the MIP images confirms the above findings. IMPRESSION: 1. No evidence of pulmonary embolism. 2. Trace left pleural effusion. 3. Extensive coronary artery calcifications. Aortic Atherosclerosis (ICD10-I70.0). Electronically Signed   By: Orvan Falconer M.D.   On: 01/13/2023 17:29   MR FEMUR LEFT WO CONTRAST  Result Date: 01/13/2023 CLINICAL DATA:  Left thigh pain and weakness. EXAM: MR OF THE LEFT FEMUR WITHOUT CONTRAST TECHNIQUE: Multiplanar, multisequence MR imaging of the left thigh was performed. No intravenous contrast was administered. COMPARISON:  Left femur x-rays dated January 06, 2023. FINDINGS: Bones/Joint/Cartilage No marrow signal abnormality. No fracture or dislocation. Mild bilateral hip degenerative changes. Prior left knee medial unicompartmental arthroplasty. Small bilateral knee joint effusions. Ligaments Collateral ligaments are intact.  Muscles and Tendons Intact. Scattered  muscle edema within the left thigh, predominantly involving the sartorius, vastus medialis, and vastus lateralis muscles. There is also involvement of the distal left iliopsoas muscle. Mild fatty infiltration of the left thigh muscles, with more focal atrophy of the adductor magnus and rectus femoris muscles. No significant muscle edema in the visualized right thigh on the coronal sequences. Soft tissue Mild thigh soft tissue swelling, greatest laterally. No fluid collection or hematoma. No soft tissue mass. Moderate right and trace left hydroceles. IMPRESSION: 1. Scattered muscle edema within the left thigh, predominantly involving the sartorius, vastus medialis, and vastus lateralis muscles. There is also involvement of the distal left iliopsoas muscle. Findings are nonspecific and may be related to myositis or denervation. 2. No acute osseous abnormality. Electronically Signed   By: Obie Dredge M.D.   On: 01/13/2023 12:12      Assessment and Plan:  82 y/o m with hx of trigeminal neuralgia, admitted with sepsis of unclear etiology, possibly secondary to infectious ileitis, with questionable pulmonary embolism, transient significant troponinemia attributed to demand ischemia vs PE, who experienced drop in hemoglobin with associated positive FOBT. No overt GI bleeding.   Repeat CTPA did not show presence of pulmonary embolism, which means that the initial CTPA was probably inaccurate (seems unlikely the clot would dissolve with heparin/Coumadin that quickly).  Repeat echocardiogram is pending to evaluate for evidence of ongoing right heart strain.  Will reassess for timing of colonoscopy following results of echocardiogram.   May be Monday versus possible outpatient if patient is discharged.    Given stability of hemoglobin and absence of overt bleeding, no indication for urgent colonoscopy.  Want to make sure any needed cardiac evaluation is complete prior to  undergoing sedated procedure.  Fecal occult blood test positive/normocytic anemia - Iron panel not entirely consistent with iron deficiency (ferritin 144, saturation 13%, low TIBC) - Colonoscopy warranted, timing to be determined as discussed above   Ileitis - Unclear if this is acute and was source of sepsis or represents chronic process such as Crohn's disease, favor the former - Colonoscopy as above  Questionable pulmonary embolism with right heart strain - Repeat CTPA negative for pulmonary embolism - Repeat echocardiogram pending - Coumadin on hold since November 14  Markedly elevated troponins (3000) - Attributed to demand ischemia versus pulmonary embolism - May warrant cardiology clearance prior to colonoscopy    Jenel Lucks, MD  01/14/2023, 10:55 AM [facility]

## 2023-01-15 ENCOUNTER — Inpatient Hospital Stay (HOSPITAL_COMMUNITY): Payer: Medicare Other

## 2023-01-15 DIAGNOSIS — K529 Noninfective gastroenteritis and colitis, unspecified: Secondary | ICD-10-CM | POA: Diagnosis not present

## 2023-01-15 DIAGNOSIS — R195 Other fecal abnormalities: Secondary | ICD-10-CM | POA: Diagnosis not present

## 2023-01-15 DIAGNOSIS — A419 Sepsis, unspecified organism: Secondary | ICD-10-CM | POA: Diagnosis not present

## 2023-01-15 DIAGNOSIS — R008 Other abnormalities of heart beat: Secondary | ICD-10-CM

## 2023-01-15 DIAGNOSIS — I2699 Other pulmonary embolism without acute cor pulmonale: Secondary | ICD-10-CM | POA: Diagnosis not present

## 2023-01-15 DIAGNOSIS — D649 Anemia, unspecified: Secondary | ICD-10-CM | POA: Diagnosis not present

## 2023-01-15 LAB — CBC
HCT: 27.4 % — ABNORMAL LOW (ref 39.0–52.0)
Hemoglobin: 8.9 g/dL — ABNORMAL LOW (ref 13.0–17.0)
MCH: 30.8 pg (ref 26.0–34.0)
MCHC: 32.5 g/dL (ref 30.0–36.0)
MCV: 94.8 fL (ref 80.0–100.0)
Platelets: 484 10*3/uL — ABNORMAL HIGH (ref 150–400)
RBC: 2.89 MIL/uL — ABNORMAL LOW (ref 4.22–5.81)
RDW: 12.9 % (ref 11.5–15.5)
WBC: 8.4 10*3/uL (ref 4.0–10.5)
nRBC: 0 % (ref 0.0–0.2)

## 2023-01-15 LAB — GLUCOSE, CAPILLARY
Glucose-Capillary: 151 mg/dL — ABNORMAL HIGH (ref 70–99)
Glucose-Capillary: 246 mg/dL — ABNORMAL HIGH (ref 70–99)
Glucose-Capillary: 185 mg/dL — ABNORMAL HIGH (ref 70–99)
Glucose-Capillary: 234 mg/dL — ABNORMAL HIGH (ref 70–99)

## 2023-01-15 LAB — ECHOCARDIOGRAM LIMITED
Area-P 1/2: 2.69 cm2
Height: 66 in
S' Lateral: 2 cm
Weight: 2994.73 [oz_av]

## 2023-01-15 LAB — PROTIME-INR
INR: 1.5 — ABNORMAL HIGH (ref 0.8–1.2)
Prothrombin Time: 17.9 s — ABNORMAL HIGH (ref 11.4–15.2)

## 2023-01-15 NOTE — Progress Notes (Signed)
  Echocardiogram 2D Echocardiogram has been performed.  Delcie Roch 01/15/2023, 11:49 AM

## 2023-01-15 NOTE — Progress Notes (Signed)
Daily Progress Note  DOA: 01/09/2023 Hospital Day: 7  Chief Complaint: worsening anemia, FOBT+  ASSESSMENT    Brief Narrative:  Larry Lane is a 82 y.o. year old male with a history of  trigeminal neuralgia,  HTN and appendectomy.  Admitted 11/6 to 11/11 with sepsis requiring ICU admission with pressors. Extensive evaluation for source of sepsis, including lumbar puncture,  was unremarkable . Sepsis felt to be 2/2 to ileitis seen on CT scan. A CT chest raised concern for a PE, started on warfarin.  Transferred to CIR on 01/09/23. GI saw in consult 11/5 for declining hgb, FOBT+  Sepsis ( reason for admission). Resolved. Source unknown but was felt to be 2/2 to ileitis seen on CT scan  --Received 7 days of flagyl.   Ileitis with partial or early SBO on admission.  May have been infectious but IBD not ruled  He has chronic loose stool and chronic intermittent lower abdominal discomfort at baseline without any acute worsening of these symptoms prior to admission.     Chronic anemia, baseline hgb mid 11 range.    Worsening of chronic anemia / FOBT x2 after stating warfarin this admission Hgb 12.4 in Sept >> 11.7 at time of this admission >> down  into 8 range over last several days.  TODAY:  Hgb stable at 8.9. Off warfarin now.  .   Questionable PE  - CT scan earlier this admission raising concern for LUL PE with right heart strain.  Repeat chest CTA negative for PE, warfarin stopped. INR is < 2.    Markedly elevated troponin early this admission.  Had echo today. Admitting team is consulting Cardiology tomorrow. Would like Cardiology clearance prior to colonoscopy .     DM2  Principal Problem:   Sepsis (HCC) Active Problems:   Anemia   Fecal occult blood test positive   Acute pulmonary embolism (HCC)   PLAN   Following Cardiology clearance we can proceed with colonoscopy to evaluate anemia, FOBT+ and ileitis on CT scan.    Subjective   No abdominal pain. No  complaints.   Objective    Recent Labs    01/13/23 0608 01/14/23 0530 01/15/23 0459  WBC 8.4 7.3 8.4  HGB 8.6* 9.0* 8.9*  HCT 26.9* 27.6* 27.4*  PLT 463* 503* 484*   BMET No results for input(s): "NA", "K", "CL", "CO2", "GLUCOSE", "BUN", "CREATININE", "CALCIUM" in the last 72 hours. LFT No results for input(s): "PROT", "ALBUMIN", "AST", "ALT", "ALKPHOS", "BILITOT", "BILIDIR", "IBILI" in the last 72 hours. PT/INR Recent Labs    01/14/23 0530 01/15/23 0459  LABPROT 19.0* 17.9*  INR 1.6* 1.5*     Imaging:  ECHOCARDIOGRAM LIMITED    ECHOCARDIOGRAM LIMITED REPORT       Patient Name:   Larry Lane Date of Exam: 01/15/2023 Medical Rec #:  [id number]       Height:       66.0 in Accession #:    [id number]      Weight:       187.2 lb Date of Birth:  21-Oct-1940      BSA:          1.944 m Patient Age:    81 years        BP:           154/74 mmHg Patient Gender: M               HR:  79 bpm. Exam Location:  Inpatient  Procedure: Limited Echo, Color Doppler and Cardiac Doppler  Indications:    other abnormalities of the heart   History:        Patient has prior history of Echocardiogram examinations, most                 recent 01/05/2023. Risk Factors:Hypertension, Dyslipidemia and                 Diabetes.   Sonographer:    Delcie Roch RDCS Referring Phys: [id number] PAMELA S LOVE  IMPRESSIONS   1. Left ventricular ejection fraction, by estimation, is 70 to 75%. The left ventricle has hyperdynamic function. The left ventricle has no regional wall motion abnormalities. There is mild asymmetric left ventricular hypertrophy of the basal-septal  segment. Indeterminate diastolic filling due to E-A fusion.  2. Right ventricular systolic function is mildly reduced. The right ventricular size is severely enlarged.  3. The mitral valve is grossly normal. No evidence of mitral valve regurgitation. No evidence of mitral stenosis.  4. The aortic valve is tricuspid.  Aortic valve regurgitation is not visualized. Aortic valve sclerosis is present, with no evidence of aortic valve stenosis.  5. The inferior vena cava is normal in size with greater than 50% respiratory variability, suggesting right atrial pressure of 3 mmHg.  Comparison(s): No significant change from prior study. Prior images reviewed side by side.  FINDINGS  Left Ventricle: Left ventricular ejection fraction, by estimation, is 70 to 75%. The left ventricle has hyperdynamic function. The left ventricle has no regional wall motion abnormalities. There is mild asymmetric left ventricular hypertrophy of the  basal-septal segment. Indeterminate diastolic filling due to E-A fusion.  Right Ventricle: The right ventricular size is severely enlarged. Right ventricular systolic function is mildly reduced.  Left Atrium: Left atrial size was normal in size.  Right Atrium: Right atrial size was normal in size.  Pericardium: There is no evidence of pericardial effusion.  Mitral Valve: The mitral valve is grossly normal. No evidence of mitral valve stenosis.  Tricuspid Valve: The tricuspid valve is normal in structure. Tricuspid valve regurgitation is not demonstrated. No evidence of tricuspid stenosis.  Aortic Valve: The aortic valve is tricuspid. There is moderate aortic valve annular calcification. Aortic valve regurgitation is not visualized. Aortic valve sclerosis is present, with no evidence of aortic valve stenosis.  Pulmonic Valve: The pulmonic valve was normal in structure. Pulmonic valve regurgitation is not visualized. No evidence of pulmonic stenosis.  Venous: The inferior vena cava is normal in size with greater than 50% respiratory variability, suggesting right atrial pressure of 3 mmHg.  IAS/Shunts: No atrial level shunt detected by color flow Doppler.  Additional Comments: Spectral Doppler performed. Color Doppler performed.   LEFT VENTRICLE PLAX 2D LVIDd:         3.20 cm    Diastology LVIDs:         2.00 cm   LV e' medial:    5.00 cm/s LV PW:         1.10 cm   LV E/e' medial:  20.2 LV IVS:        1.20 cm   LV e' lateral:   5.33 cm/s LVOT diam:     2.00 cm   LV E/e' lateral: 18.9 LV SV:         92 LV SV Index:   47 LVOT Area:     3.14 cm    IVC IVC diam: 1.70 cm  LEFT ATRIUM         Index LA diam:    3.80 cm 1.95 cm/m  AORTIC VALVE LVOT Vmax:   129.00 cm/s LVOT Vmean:  87.300 cm/s LVOT VTI:    0.293 m   AORTA Ao Asc diam: 3.60 cm  MITRAL VALVE MV Area (PHT): 2.69 cm     SHUNTS MV Decel Time: 282 msec     Systemic VTI:  0.29 m MV E velocity: 101.00 cm/s  Systemic Diam: 2.00 cm MV A velocity: 151.00 cm/s MV E/A ratio:  0.67  Riley Lam MD Electronically signed by Riley Lam MD Signature Date/Time: 01/15/2023/2:09:07 PM      Final       Scheduled inpatient medications:   acarbose  25 mg Oral TID WC   amLODipine  5 mg Oral Daily   carbamazepine  200 mg Oral QID   gabapentin  600 mg Oral TID   insulin aspart  0-5 Units Subcutaneous QHS   insulin aspart  0-9 Units Subcutaneous TID WC   insulin aspart  3 Units Subcutaneous TID WC   insulin glargine-yfgn  6 Units Subcutaneous Daily   losartan  25 mg Oral Daily   magnesium gluconate  500 mg Oral QHS   pantoprazole  40 mg Oral Daily   rosuvastatin  10 mg Oral Daily   Continuous inpatient infusions:  PRN inpatient medications: acetaminophen, alum & mag hydroxide-simeth, bisacodyl, diphenhydrAMINE, guaiFENesin-dextromethorphan, methocarbamol, ondansetron **OR** ondansetron (ZOFRAN) IV, oxyCODONE, polyethylene glycol, sodium chloride flush, sodium phosphate  Vital signs in last 24 hours: Temp:  [98.1 F (36.7 C)-98.5 F (36.9 C)] 98.1 F (36.7 C) (11/17 1308) Pulse Rate:  [73-77] 77 (11/17 1308) Resp:  [17-18] 18 (11/17 1308) BP: (126-154)/(58-74) 138/63 (11/17 1308) SpO2:  [96 %-98 %] 96 % (11/17 1308) Last BM Date : 01/14/23  Intake/Output Summary (Last 24  hours) at 01/15/2023 1635 Last data filed at 01/15/2023 1323 Gross per 24 hour  Intake 480 ml  Output --  Net 480 ml    Intake/Output from previous day: 11/16 0701 - 11/17 0700 In: 480 [P.O.:480] Out: -  Intake/Output this shift: Total I/O In: 480 [P.O.:480] Out: -    Physical Exam:  General: Alert male in NAD Heart:  Regular rate and rhythm.  Pulmonary: Normal respiratory effort Abdomen: Soft, nondistended, nontender. Normal bowel sounds. Extremities: No lower extremity edema  Neurologic: Alert and oriented Psych: Pleasant. Cooperative. Insight appears normal.      LOS: 6 days   Willette Cluster ,NP 01/15/2023, 4:35 PM

## 2023-01-15 NOTE — Progress Notes (Addendum)
PROGRESS NOTE   Subjective/Complaints: Pt feels well today. A little sore from therapy but in good spirits.   ROS: Patient denies fever, rash, sore throat, blurred vision, dizziness, nausea, vomiting, diarrhea, cough, shortness of breath or chest pain,  headache, or mood change.     Objective:   CT Angio Chest Pulmonary Embolism (PE) W or WO Contrast  Result Date: 01/13/2023 CLINICAL DATA:  Pulmonary embolism (PE) suspected, high prob. Follow-up previously questioned left upper and left lower lobe pulmonary emboli. EXAM: CT ANGIOGRAPHY CHEST WITH CONTRAST TECHNIQUE: Multidetector CT imaging of the chest was performed using the standard protocol during bolus administration of intravenous contrast. Multiplanar CT image reconstructions and MIPs were obtained to evaluate the vascular anatomy. RADIATION DOSE REDUCTION: This exam was performed according to the departmental dose-optimization program which includes automated exposure control, adjustment of the mA and/or kV according to patient size and/or use of iterative reconstruction technique. CONTRAST:  75mL OMNIPAQUE IOHEXOL 350 MG/ML SOLN COMPARISON:  CTA chest 01/04/2023. FINDINGS: Cardiovascular: Satisfactory opacification of the pulmonary arteries to the segmental level. No evidence of pulmonary embolism. Normal heart size. No pericardial effusion. Extensive coronary artery calcifications and atherosclerotic calcifications of the thoracic aorta. Mediastinum/Nodes: No enlarged mediastinal, hilar, or axillary lymph nodes. Thyroid gland, trachea, and esophagus demonstrate no significant findings. Lungs/Pleura: No consolidation or pulmonary edema. Trace left pleural effusion. No pneumothorax. Upper Abdomen: No acute abnormality. Musculoskeletal: No chest wall abnormality. No acute or significant osseous findings. Review of the MIP images confirms the above findings. IMPRESSION: 1. No evidence of  pulmonary embolism. 2. Trace left pleural effusion. 3. Extensive coronary artery calcifications. Aortic Atherosclerosis (ICD10-I70.0). Electronically Signed   By: Orvan Falconer M.D.   On: 01/13/2023 17:29   Recent Labs    01/14/23 0530 01/15/23 0459  WBC 7.3 8.4  HGB 9.0* 8.9*  HCT 27.6* 27.4*  PLT 503* 484*   No results for input(s): "NA", "K", "CL", "CO2", "GLUCOSE", "BUN", "CREATININE", "CALCIUM" in the last 72 hours.   Intake/Output Summary (Last 24 hours) at 01/15/2023 0929 Last data filed at 01/15/2023 0750 Gross per 24 hour  Intake 480 ml  Output --  Net 480 ml        Physical Exam: Vital Signs Blood pressure (!) 154/74, pulse 73, temperature 98.5 F (36.9 C), temperature source Oral, resp. rate 17, height 5\' 6"  (1.676 m), weight 84.9 kg, SpO2 98%. Constitutional: No distress . Vital signs reviewed. HEENT: NCAT, EOMI, oral membranes moist Neck: supple Cardiovascular: RRR without murmur. No JVD    Respiratory/Chest: CTA Bilaterally without wheezes or rales. Normal effort    GI/Abdomen: BS +, non-tender, non-distended Ext: no clubbing, cyanosis, or edema Psych: pleasant and cooperative  Skin: intact Neuro: Alert and oriented x3 Musculoskeletal: 5/5 except for absent left knee extension which is at least 3/5, impaired hip flexion 2+ to 3/5, and decreased sensation of left thigh--improving--  There is sl swelling in the left thigh.  Assessment/Plan: 1. Functional deficits which require 3+ hours per day of interdisciplinary therapy in a comprehensive inpatient rehab setting. Physiatrist is providing close team supervision and 24 hour management of active medical problems listed below. Physiatrist and rehab team continue to  assess barriers to discharge/monitor patient progress toward functional and medical goals  Care Tool:  Bathing    Body parts bathed by patient: Face, Right upper leg, Left upper leg, Right arm, Left arm, Chest, Abdomen, Front perineal area    Body parts bathed by helper: Right lower leg, Left lower leg, Buttocks     Bathing assist Assist Level: Minimal Assistance - Patient > 75%     Upper Body Dressing/Undressing Upper body dressing   What is the patient wearing?: Pull over shirt    Upper body assist Assist Level: Set up assist    Lower Body Dressing/Undressing Lower body dressing      What is the patient wearing?: Underwear/pull up, Pants     Lower body assist Assist for lower body dressing: Moderate Assistance - Patient 50 - 74%     Toileting Toileting    Toileting assist Assist for toileting: Minimal Assistance - Patient > 75%     Transfers Chair/bed transfer  Transfers assist     Chair/bed transfer assist level: Contact Guard/Touching assist     Locomotion Ambulation   Ambulation assist      Assist level: Minimal Assistance - Patient > 75% Assistive device: Walker-rolling Max distance: 145'   Walk 10 feet activity   Assist     Assist level: Minimal Assistance - Patient > 75% Assistive device: Walker-rolling   Walk 50 feet activity   Assist    Assist level: Minimal Assistance - Patient > 75% Assistive device: Walker-rolling    Walk 150 feet activity   Assist Walk 150 feet activity did not occur: Safety/medical concerns         Walk 10 feet on uneven surface  activity   Assist Walk 10 feet on uneven surfaces activity did not occur: Safety/medical concerns         Wheelchair     Assist Is the patient using a wheelchair?: Yes Type of Wheelchair: Manual    Wheelchair assist level: Dependent - Patient 0%      Wheelchair 50 feet with 2 turns activity    Assist        Assist Level: Dependent - Patient 0%   Wheelchair 150 feet activity     Assist      Assist Level: Dependent - Patient 0%   Blood pressure (!) 154/74, pulse 73, temperature 98.5 F (36.9 C), temperature source Oral, resp. rate 17, height 5\' 6"  (1.676 m), weight 84.9 kg, SpO2  98%.  Medical Problem List and Plan: 1. Functional deficits secondary to debility 2/2 sepsis             -patient may shower             -ELOS/Goals: 8 days mod I   -Continue CIR therapies including PT, OT  2.  Antithrombotics: -DVT/anticoagulation:  Pharmaceutical: Coumadin on hold (INR still 1.5)             -antiplatelet therapy: none   3. Pain Management: Tylenol, oxycodone 5 mg q 4 hrs prn as needed             -gabapentin 600 mg TID   4. Mood/Behavior/Sleep: LCSW to evaluate and provide emotional support             -trazodone 100 mg q HS prn             -antipsychotic agents: n/a   5. Neuropsych/cognition: This patient is not capable of making decisions on his own behalf.   6.  Skin/Wound Care: Routine skin care checks   7. Fluids/Electrolytes/Nutrition: Routine Is and Os and follow-up chemistries             -carb modified diet   8: Hypertension: monitor TID and prn. Restart losartan 25mg  daily. 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; A1c = 7.6% -increase semglee to 6U  -continue SSI -continue Novolog 3 units TID -acarbose added TID   CBG (last 3)  Recent Labs    01/14/23 1647 01/14/23 2120 01/15/23 0623  GLUCAP 207* 149* 151*    -11/17 reasonable control until yesterday--obsv for pattern today 11: Trigeminal neuralgia:  -continue Tegretol XR 200 mg QID -continue gabapentin 600 mg TID  -discussed his history of treatment   12: Acute PE: continue Coumadin, dosing per pharmacy consult.     -11/16 CTA reviewed and is negative for PE, discussed with patient and wife   13: GERD: continue PPI   14: Anemia: stool occult positive, repeat also resulted positive,   11/17 today's Hgb up to stable at 8.9, no gross blood in stool. Pt appears comfortable   -CTA negative   -limited ECHO is still  pending. At this point it might not be done until Monday. If negative will permanently stop warfarin   -colonoscopy per GI, appreciate their help!  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 MRI left femur ordered and consistent with denervation  -I reviewed findings with pt and wife today  -he already has experienced improvement in the femoral nerve motor/sensory fxn  -consider EMG/NCS as outpt depending upon how he's doing  -may try e-stim on thigh in therapy        LOS: 6 days A FACE TO FACE EVALUATION WAS PERFORMED  Ranelle Oyster 01/15/2023, 9:29 AM

## 2023-01-16 DIAGNOSIS — I2510 Atherosclerotic heart disease of native coronary artery without angina pectoris: Secondary | ICD-10-CM

## 2023-01-16 DIAGNOSIS — N179 Acute kidney failure, unspecified: Secondary | ICD-10-CM | POA: Diagnosis not present

## 2023-01-16 DIAGNOSIS — D649 Anemia, unspecified: Secondary | ICD-10-CM | POA: Diagnosis not present

## 2023-01-16 DIAGNOSIS — R6520 Severe sepsis without septic shock: Secondary | ICD-10-CM | POA: Diagnosis not present

## 2023-01-16 DIAGNOSIS — R195 Other fecal abnormalities: Secondary | ICD-10-CM | POA: Diagnosis not present

## 2023-01-16 DIAGNOSIS — I50810 Right heart failure, unspecified: Secondary | ICD-10-CM | POA: Diagnosis not present

## 2023-01-16 DIAGNOSIS — Z01810 Encounter for preprocedural cardiovascular examination: Secondary | ICD-10-CM

## 2023-01-16 DIAGNOSIS — G5721 Lesion of femoral nerve, right lower limb: Secondary | ICD-10-CM | POA: Diagnosis not present

## 2023-01-16 DIAGNOSIS — A419 Sepsis, unspecified organism: Secondary | ICD-10-CM | POA: Diagnosis not present

## 2023-01-16 LAB — BASIC METABOLIC PANEL
Anion gap: 5 (ref 5–15)
BUN: 9 mg/dL (ref 8–23)
CO2: 28 mmol/L (ref 22–32)
Calcium: 8 mg/dL — ABNORMAL LOW (ref 8.9–10.3)
Chloride: 104 mmol/L (ref 98–111)
Creatinine, Ser: 0.72 mg/dL (ref 0.61–1.24)
GFR, Estimated: >60 mL/min (ref >60)
Glucose, Bld: 173 mg/dL — ABNORMAL HIGH (ref 70–99)
Potassium: 3.6 mmol/L (ref 3.5–5.1)
Sodium: 137 mmol/L (ref 135–145)

## 2023-01-16 LAB — CBC
HCT: 27.8 % — ABNORMAL LOW (ref 39.0–52.0)
Hemoglobin: 9 g/dL — ABNORMAL LOW (ref 13.0–17.0)
MCH: 30.9 pg (ref 26.0–34.0)
MCHC: 32.4 g/dL (ref 30.0–36.0)
MCV: 95.5 fL (ref 80.0–100.0)
Platelets: 578 10*3/uL — ABNORMAL HIGH (ref 150–400)
RBC: 2.91 MIL/uL — ABNORMAL LOW (ref 4.22–5.81)
RDW: 12.8 % (ref 11.5–15.5)
WBC: 7.2 10*3/uL (ref 4.0–10.5)
nRBC: 0 % (ref 0.0–0.2)

## 2023-01-16 LAB — GLUCOSE, CAPILLARY
Glucose-Capillary: 167 mg/dL — ABNORMAL HIGH (ref 70–99)
Glucose-Capillary: 215 mg/dL — ABNORMAL HIGH (ref 70–99)
Glucose-Capillary: 235 mg/dL — ABNORMAL HIGH (ref 70–99)
Glucose-Capillary: 195 mg/dL — ABNORMAL HIGH (ref 70–99)

## 2023-01-16 LAB — PROTIME-INR
INR: 1.3 — ABNORMAL HIGH (ref 0.8–1.2)
Prothrombin Time: 15.9 s — ABNORMAL HIGH (ref 11.4–15.2)

## 2023-01-16 MED ORDER — INSULIN GLARGINE-YFGN 100 UNIT/ML ~~LOC~~ SOLN
7.0000 [IU] | Freq: Every day | SUBCUTANEOUS | Status: DC
  Administered 2023-01-17: 7 [IU] via SUBCUTANEOUS
  Filled 2023-01-16: qty 0.07

## 2023-01-16 NOTE — Plan of Care (Signed)
  Problem: RH Memory Goal: LTG Patient will demonstrate ability for day to day recall/carry over during activities of daily living with assistance level (PT) Description: LTG:  Patient will demonstrate ability for day to day recall/carry over during activities of daily living with assistance level (PT). Outcome: Completed/Met   Problem: RH Balance Goal: LTG Patient will maintain dynamic standing balance (PT) Description: LTG:  Patient will maintain dynamic standing balance with assistance during mobility activities (PT) Outcome: Completed/Met   Problem: Sit to Stand Goal: LTG:  Patient will perform sit to stand with assistance level (PT) Description: LTG:  Patient will perform sit to stand with assistance level (PT) Outcome: Completed/Met   Problem: RH Bed Mobility Goal: LTG Patient will perform bed mobility with assist (PT) Description: LTG: Patient will perform bed mobility with assistance, with/without cues (PT). Outcome: Completed/Met   Problem: RH Bed to Chair Transfers Goal: LTG Patient will perform bed/chair transfers w/assist (PT) Description: LTG: Patient will perform bed to chair transfers with assistance (PT). Outcome: Completed/Met   Problem: RH Car Transfers Goal: LTG Patient will perform car transfers with assist (PT) Description: LTG: Patient will perform car transfers with assistance (PT). Outcome: Completed/Met   Problem: RH Ambulation Goal: LTG Patient will ambulate in controlled environment (PT) Description: LTG: Patient will ambulate in a controlled environment, # of feet with assistance (PT). Outcome: Completed/Met Goal: LTG Patient will ambulate in home environment (PT) Description: LTG: Patient will ambulate in home environment, # of feet with assistance (PT). Outcome: Completed/Met   Problem: RH Stairs Goal: LTG Patient will ambulate up and down stairs w/assist (PT) Description: LTG: Patient will ambulate up and down # of stairs with assistance  (PT) Outcome: Completed/Met

## 2023-01-16 NOTE — Progress Notes (Signed)
Physical Therapy Discharge Summary  Patient Details  Name: Larry Lane MRN: [id number] Date of Birth: 04-18-1940  Date of Discharge from PT service:January 16, 2023   Patient has met 8 of 8 long term goals due to improved activity tolerance, improved balance, increased strength, ability to compensate for deficits, and functional use of  left lower extremity.  Patient to discharge at an ambulatory level Supervision.   Patient's care partner is independent to provide the necessary physical and cognitive assistance at discharge.  Reasons goals not met: n/a  Recommendation:  Patient will benefit from ongoing skilled PT services in outpatient setting to continue to advance safe functional mobility, address ongoing impairments in LLE weakness, balance, and gait impairments, and minimize fall risk.  Equipment: No equipment provided - pt owns RW  Reasons for discharge: treatment goals met and discharge from hospital  Patient/family agrees with progress made and goals achieved: Yes  PT Discharge Precautions/Restrictions Precautions Precautions: Fall Precaution Comments: L knee buckle Restrictions Weight Bearing Restrictions: No Pain Interference Pain Interference Pain Effect on Sleep: 1. Rarely or not at all Pain Interference with Therapy Activities: 1. Rarely or not at all Pain Interference with Day-to-Day Activities: 1. Rarely or not at all Vision/Perception  Vision - History Ability to See in Adequate Light: 0 Adequate Perception Perception: Within Functional Limits Praxis Praxis: WFL  Cognition Overall Cognitive Status: History of cognitive impairments - at baseline Arousal/Alertness: Awake/alert Orientation Level: Oriented X4 Attention: Focused;Selective;Alternating Focused Attention: Appears intact Selective Attention: Appears intact Alternating Attention: Impaired Alternating Attention Impairment: Verbal basic;Functional basic Memory: Impaired Memory Impairment:  Retrieval deficit;Decreased recall of new information;Decreased short term memory Awareness: Impaired Awareness Impairment: Anticipatory impairment Problem Solving: Appears intact Safety/Judgment: Appears intact Sensation Sensation Light Touch: Appears Intact Hot/Cold: Appears Intact Proprioception: Appears Intact Stereognosis: Appears Intact Coordination Gross Motor Movements are Fluid and Coordinated: No Coordination and Movement Description: General deconditioning and weakness with new LLE weakness Motor  Motor Motor: Other (comment) Motor - Skilled Clinical Observations: generalized weakness, unbalanced quad activation demonstrates buckling with LLE in stance phase during functional upright mobility  Mobility Bed Mobility Bed Mobility: Rolling Right;Rolling Left;Left Sidelying to Sit;Supine to Sit Rolling Right: Independent Rolling Left: Independent Supine to Sit: Supervision/Verbal cueing Sit to Supine: Supervision/Verbal cueing Transfers Transfers: Stand to Sit;Sit to Stand;Stand Pivot Transfers Sit to Stand: Supervision/Verbal cueing Stand to Sit: Supervision/Verbal cueing Stand Pivot Transfers: Supervision/Verbal cueing Stand Pivot Transfer Details: Verbal cues for safe use of DME/AE;Verbal cues for precautions/safety;Verbal cues for technique;Verbal cues for gait pattern Transfer (Assistive device): Rolling walker Locomotion  Gait Ambulation: Yes Gait Assistance: Supervision/Verbal cueing Gait Distance (Feet): 150 Feet Assistive device: Rolling walker Gait Assistance Details: Verbal cues for technique;Verbal cues for gait pattern;Verbal cues for safe use of DME/AE;Verbal cues for precautions/safety Gait Gait: Yes Gait Pattern: Impaired Gait Pattern: Decreased stride length;Step-through pattern;Trunk flexed;Right flexed knee in stance Stairs / Additional Locomotion Stairs: Yes Stairs Assistance: Contact Guard/Touching assist Stair Management Technique: Two  rails;Step to pattern;Forwards Number of Stairs: 12 Height of Stairs: 6 Pick up small object from the floor assist level: Contact Guard/Touching assist Wheelchair Mobility Wheelchair Mobility: No  Trunk/Postural Assessment  Cervical Assessment Cervical Assessment: Within Functional Limits Thoracic Assessment Thoracic Assessment: Within Functional Limits Lumbar Assessment Lumbar Assessment: Within Functional Limits Postural Control Postural Control: Within Functional Limits  Balance Balance Balance Assessed: Yes Standardized Balance Assessment Standardized Balance Assessment: Timed Up and Go Test Timed Up and Go Test TUG: Normal TUG;Cognitive TUG Normal TUG (seconds): 28 Cognitive  TUG (seconds): 36.5 Static Sitting Balance Static Sitting - Balance Support: Feet supported;No upper extremity supported Static Sitting - Level of Assistance: 7: Independent Dynamic Sitting Balance Dynamic Sitting - Balance Support: Feet supported Static Standing Balance Static Standing - Balance Support: Bilateral upper extremity supported Static Standing - Level of Assistance: 6: Modified independent (Device/Increase time) Dynamic Standing Balance Dynamic Standing - Balance Support: Bilateral upper extremity supported;During functional activity Dynamic Standing - Level of Assistance: 5: Stand by assistance Extremity Assessment      RLE Assessment RLE Assessment: Within Functional Limits LLE Assessment LLE Assessment: Exceptions to [facility] LLE Strength Left Hip Flexion: 2-/5 Left Knee Flexion: 4/5 Left Knee Extension: 2-/5 Left Ankle Dorsiflexion: 5/5   Gabrielly Mccrystal P Jillienne Egner  PT, DPT, CSRS  01/16/2023, 10:02 AM

## 2023-01-16 NOTE — Progress Notes (Signed)
PROGRESS NOTE   Subjective/Complaints: Feels nauseous this morning Discussed with GI consultation of cardiology for clearance for colonoscopy, discussed with patient and wife  ROS: Patient denies fever, rash, sore throat, blurred vision, dizziness, nausea, vomiting, diarrhea, cough, shortness of breath or chest pain,  headache, or mood change.  +nausea   Objective:   ECHOCARDIOGRAM LIMITED  Result Date: 01/15/2023    ECHOCARDIOGRAM LIMITED REPORT   Patient Name:   Larry Lane Date of Exam: 01/15/2023 Medical Rec #:  [id number]       Height:       66.0 in Accession #:    [id number]      Weight:       187.2 lb Date of Birth:  1941/02/27      BSA:          1.944 m Patient Age:    81 years        BP:           154/74 mmHg Patient Gender: M               HR:           79 bpm. Exam Location:  Inpatient Procedure: Limited Echo, Color Doppler and Cardiac Doppler Indications:    other abnormalities of the heart  History:        Patient has prior history of Echocardiogram examinations, most                 recent 01/05/2023. Risk Factors:Hypertension, Dyslipidemia and                 Diabetes.  Sonographer:    Delcie Roch RDCS Referring Phys: [id number] PAMELA S LOVE IMPRESSIONS  1. Left ventricular ejection fraction, by estimation, is 70 to 75%. The left ventricle has hyperdynamic function. The left ventricle has no regional wall motion abnormalities. There is mild asymmetric left ventricular hypertrophy of the basal-septal segment. Indeterminate diastolic filling due to E-A fusion.  2. Right ventricular systolic function is mildly reduced. The right ventricular size is severely enlarged.  3. The mitral valve is grossly normal. No evidence of mitral valve regurgitation. No evidence of mitral stenosis.  4. The aortic valve is tricuspid. Aortic valve regurgitation is not visualized. Aortic valve sclerosis is present, with no evidence of aortic valve  stenosis.  5. The inferior vena cava is normal in size with greater than 50% respiratory variability, suggesting right atrial pressure of 3 mmHg. Comparison(s): No significant change from prior study. Prior images reviewed side by side. FINDINGS  Left Ventricle: Left ventricular ejection fraction, by estimation, is 70 to 75%. The left ventricle has hyperdynamic function. The left ventricle has no regional wall motion abnormalities. There is mild asymmetric left ventricular hypertrophy of the basal-septal segment. Indeterminate diastolic filling due to E-A fusion. Right Ventricle: The right ventricular size is severely enlarged. Right ventricular systolic function is mildly reduced. Left Atrium: Left atrial size was normal in size. Right Atrium: Right atrial size was normal in size. Pericardium: There is no evidence of pericardial effusion. Mitral Valve: The mitral valve is grossly normal. No evidence of mitral valve stenosis. Tricuspid Valve: The tricuspid valve is normal  in structure. Tricuspid valve regurgitation is not demonstrated. No evidence of tricuspid stenosis. Aortic Valve: The aortic valve is tricuspid. There is moderate aortic valve annular calcification. Aortic valve regurgitation is not visualized. Aortic valve sclerosis is present, with no evidence of aortic valve stenosis. Pulmonic Valve: The pulmonic valve was normal in structure. Pulmonic valve regurgitation is not visualized. No evidence of pulmonic stenosis. Venous: The inferior vena cava is normal in size with greater than 50% respiratory variability, suggesting right atrial pressure of 3 mmHg. IAS/Shunts: No atrial level shunt detected by color flow Doppler. Additional Comments: Spectral Doppler performed. Color Doppler performed.  LEFT VENTRICLE PLAX 2D LVIDd:         3.20 cm   Diastology LVIDs:         2.00 cm   LV e' medial:    5.00 cm/s LV PW:         1.10 cm   LV E/e' medial:  20.2 LV IVS:        1.20 cm   LV e' lateral:   5.33 cm/s LVOT  diam:     2.00 cm   LV E/e' lateral: 18.9 LV SV:         92 LV SV Index:   47 LVOT Area:     3.14 cm  IVC IVC diam: 1.70 cm LEFT ATRIUM         Index LA diam:    3.80 cm 1.95 cm/m  AORTIC VALVE LVOT Vmax:   129.00 cm/s LVOT Vmean:  87.300 cm/s LVOT VTI:    0.293 m  AORTA Ao Asc diam: 3.60 cm MITRAL VALVE MV Area (PHT): 2.69 cm     SHUNTS MV Decel Time: 282 msec     Systemic VTI:  0.29 m MV E velocity: 101.00 cm/s  Systemic Diam: 2.00 cm MV A velocity: 151.00 cm/s MV E/A ratio:  0.67 Riley Lam MD Electronically signed by Riley Lam MD Signature Date/Time: 01/15/2023/2:09:07 PM    Final    Recent Labs    01/15/23 0459 01/16/23 0614  WBC 8.4 7.2  HGB 8.9* 9.0*  HCT 27.4* 27.8*  PLT 484* 578*   Recent Labs    01/16/23 0614  NA 137  K 3.6  CL 104  CO2 28  GLUCOSE 173*  BUN 9  CREATININE 0.72  CALCIUM 8.0*     Intake/Output Summary (Last 24 hours) at 01/16/2023 1108 Last data filed at 01/16/2023 0548 Gross per 24 hour  Intake 597 ml  Output 200 ml  Net 397 ml        Physical Exam: Vital Signs Blood pressure (!) 145/73, pulse 72, temperature 98.2 F (36.8 C), resp. rate 18, height 5\' 6"  (1.676 m), weight 84.9 kg, SpO2 96%. Constitutional: No distress . Vital signs reviewed. HEENT: NCAT, EOMI, oral membranes moist Neck: supple Cardiovascular: RRR without murmur. No JVD    Respiratory/Chest: CTA Bilaterally without wheezes or rales. Normal effort    GI/Abdomen: BS +, non-tender, non-distended Ext: no clubbing, cyanosis, or edema Psych: pleasant and cooperative  Skin: intact Neuro: Alert and oriented x3 Musculoskeletal: 5/5 except for left knee extension which is at least 3/5, impaired hip flexion 2+ to 3/5, and decreased sensation of left thigh--improving--  There is sl swelling in the left thigh. Exam stable 11/18  Assessment/Plan: 1. Functional deficits which require 3+ hours per day of interdisciplinary therapy in a comprehensive inpatient rehab  setting. Physiatrist is providing close team supervision and 24 hour management of active medical  problems listed below. Physiatrist and rehab team continue to assess barriers to discharge/monitor patient progress toward functional and medical goals  Care Tool:  Bathing    Body parts bathed by patient: Face, Right upper leg, Left upper leg, Right arm, Left arm, Chest, Abdomen, Front perineal area   Body parts bathed by helper: Right lower leg, Left lower leg, Buttocks     Bathing assist Assist Level: Minimal Assistance - Patient > 75%     Upper Body Dressing/Undressing Upper body dressing   What is the patient wearing?: Pull over shirt    Upper body assist Assist Level: Set up assist    Lower Body Dressing/Undressing Lower body dressing      What is the patient wearing?: Underwear/pull up, Pants     Lower body assist Assist for lower body dressing: Moderate Assistance - Patient 50 - 74%     Toileting Toileting    Toileting assist Assist for toileting: Minimal Assistance - Patient > 75%     Transfers Chair/bed transfer  Transfers assist     Chair/bed transfer assist level: Supervision/Verbal cueing     Locomotion Ambulation   Ambulation assist      Assist level: Supervision/Verbal cueing Assistive device: Walker-rolling Max distance: 200'   Walk 10 feet activity   Assist     Assist level: Supervision/Verbal cueing Assistive device: Walker-rolling   Walk 50 feet activity   Assist    Assist level: Supervision/Verbal cueing Assistive device: Walker-rolling    Walk 150 feet activity   Assist Walk 150 feet activity did not occur: Safety/medical concerns  Assist level: Supervision/Verbal cueing Assistive device: Walker-rolling    Walk 10 feet on uneven surface  activity   Assist Walk 10 feet on uneven surfaces activity did not occur: Safety/medical concerns   Assist level: Contact Guard/Touching assist Assistive device:  Walker-rolling   Wheelchair     Assist Is the patient using a wheelchair?: No Type of Wheelchair: Manual    Wheelchair assist level: Dependent - Patient 0%      Wheelchair 50 feet with 2 turns activity    Assist        Assist Level: Dependent - Patient 0%   Wheelchair 150 feet activity     Assist      Assist Level: Dependent - Patient 0%   Blood pressure (!) 145/73, pulse 72, temperature 98.2 F (36.8 C), resp. rate 18, height 5\' 6"  (1.676 m), weight 84.9 kg, SpO2 96%.  Medical Problem List and Plan: 1. Functional deficits secondary to debility 2/2 sepsis             -patient may shower             -ELOS/Goals: 8 days mod I   -Continue CIR therapies including PT, OT  2.  Antithrombotics: -DVT/anticoagulation:  Pharmaceutical: Coumadin on hold (INR still 1.5)             -antiplatelet therapy: none   3. Pain Management: Tylenol, oxycodone 5 mg q 4 hrs prn as needed             -gabapentin 600 mg TID   4. Mood/Behavior/Sleep: LCSW to evaluate and provide emotional support             -trazodone 100 mg q HS prn             -antipsychotic agents: n/a   5. Neuropsych/cognition: This patient is not capable of making decisions on his own behalf.   6. Skin/Wound  Care: Routine skin care checks   7. Fluids/Electrolytes/Nutrition: Routine Is and Os and follow-up chemistries             -carb modified diet   8: Hypertension: monitor TID and prn. Restart losartan 25mg  daily. 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; A1c = 7.6% -increase semglee to 6U  -continue SSI -continue Novolog 3 units TID -acarbose added TID   CBG (last 3)  Recent Labs    01/15/23 1639 01/15/23 2046 01/16/23 0616  GLUCAP 185* 234* 167*    -11/17 reasonable control until yesterday--obsv for pattern today 11:  Trigeminal neuralgia:  -continue Tegretol XR 200 mg QID -continue gabapentin 600 mg TID  -discussed his history of treatment   12: Polypharmacy: coumadin d/ced as no evidence of PE on recent imaging    13: GERD: continue PPI   14: Anemia: stool occult positive, repeat also resulted positive,   11/17 today's Hgb up to stable at 8.9, no gross blood in stool. Pt appears comfortable   -CTA negative   -limited ECHO is still pending. At this point it might not be done until Monday. 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 MRI left femur ordered and consistent with denervation  -I reviewed findings with pt and wife today  -he already has experienced improvement in the femoral nerve motor/sensory fxn  -consider EMG/NCS as outpt depending upon how he's doing  -may try e-stim on thigh in therapy  -discussed improvements favor compression rather than transection and there is a good prognosis for his recovery        LOS: 7 days A FACE TO FACE EVALUATION WAS PERFORMED  Larry Lane 01/16/2023, 11:08 AM

## 2023-01-16 NOTE — Progress Notes (Signed)
Left knee buckled during therapy. Denies pain. No swelling or erythema on exam. No instability or pain with PROM. Monitor with therapy session this afternoon.

## 2023-01-16 NOTE — Progress Notes (Addendum)
Patient ID: Larry Lane, male   DOB: 1940/11/27, 82 y.o.   MRN: [id number]     Attending physician's note   I have taken a history, reviewed the chart, and examined the patient. I performed a substantive portion of this encounter, including complete performance of at least one of the key components, in conjunction with the APP. I agree with the APP's note, impression, and recommendations with my edits.   H/H stable.  Will follow-up with cardiology consult service regarding need for further cardiac evaluation prior to nonemergent colonoscopy.    Margee Trentham, DO, FACG [phone] office          Progress Note   Subjective   Day # 7 CC; anemia, heme positive stool and ileitis noted on CT  Last Coumadin dose 01/12/2023  Hgb 9.0 stable INR 1.3/PT15.9 BUN 9/creatinine 0.72  Limited Echo- EF 70-75, right ventricular size severely enlarged mitral valve grossly normal aortic sclerosis present no aortic stenosis.  Patient is having cardiology consultation with Dr. Tenny Craw currently in room   Objective   Vital signs in last 24 hours: Temp:  [98.1 F (36.7 C)-98.3 F (36.8 C)] 98.1 F (36.7 C) (11/18 1326) Pulse Rate:  [72-77] 77 (11/18 1326) Resp:  [18] 18 (11/18 1326) BP: (121-145)/(66-73) 121/66 (11/18 1326) SpO2:  [96 %-99 %] 99 % (11/18 1326) Last BM Date : 01/15/23 General:    Older white male in no acute distress   Intake/Output from previous day: 11/17 0701 - 11/18 0700 In: 837 [P.O.:837] Out: 200 [Urine:200] Intake/Output this shift: Total I/O In: 300 [P.O.:300] Out: -   Lab Results: Recent Labs    01/14/23 0530 01/15/23 0459 01/16/23 0614  WBC 7.3 8.4 7.2  HGB 9.0* 8.9* 9.0*  HCT 27.6* 27.4* 27.8*  PLT 503* 484* 578*   BMET Recent Labs    01/16/23 0614  NA 137  K 3.6  CL 104  CO2 28  GLUCOSE 173*  BUN 9  CREATININE 0.72  CALCIUM 8.0*   LFT No results for input(s): "PROT", "ALBUMIN", "AST", "ALT", "ALKPHOS", "BILITOT", "BILIDIR",  "IBILI" in the last 72 hours. PT/INR Recent Labs    01/15/23 0459 01/16/23 0614  LABPROT 17.9* 15.9*  INR 1.5* 1.3*    Studies/Results: ECHOCARDIOGRAM LIMITED  Result Date: 01/15/2023    ECHOCARDIOGRAM LIMITED REPORT   Patient Name:   Larry Lane Date of Exam: 01/15/2023 Medical Rec #:  [id number]       Height:       66.0 in Accession #:    [id number]      Weight:       187.2 lb Date of Birth:  1940-08-29      BSA:          1.944 m Patient Age:    81 years        BP:           154/74 mmHg Patient Gender: M               HR:           79 bpm. Exam Location:  Inpatient Procedure: Limited Echo, Color Doppler and Cardiac Doppler Indications:    other abnormalities of the heart  History:        Patient has prior history of Echocardiogram examinations, most                 recent 01/05/2023. Risk Factors:Hypertension, Dyslipidemia and  Diabetes.  Sonographer:    Delcie Roch RDCS Referring Phys: [id number] PAMELA S LOVE IMPRESSIONS  1. Left ventricular ejection fraction, by estimation, is 70 to 75%. The left ventricle has hyperdynamic function. The left ventricle has no regional wall motion abnormalities. There is mild asymmetric left ventricular hypertrophy of the basal-septal segment. Indeterminate diastolic filling due to E-A fusion.  2. Right ventricular systolic function is mildly reduced. The right ventricular size is severely enlarged.  3. The mitral valve is grossly normal. No evidence of mitral valve regurgitation. No evidence of mitral stenosis.  4. The aortic valve is tricuspid. Aortic valve regurgitation is not visualized. Aortic valve sclerosis is present, with no evidence of aortic valve stenosis.  5. The inferior vena cava is normal in size with greater than 50% respiratory variability, suggesting right atrial pressure of 3 mmHg. Comparison(s): No significant change from prior study. Prior images reviewed side by side. FINDINGS  Left Ventricle: Left ventricular ejection  fraction, by estimation, is 70 to 75%. The left ventricle has hyperdynamic function. The left ventricle has no regional wall motion abnormalities. There is mild asymmetric left ventricular hypertrophy of the basal-septal segment. Indeterminate diastolic filling due to E-A fusion. Right Ventricle: The right ventricular size is severely enlarged. Right ventricular systolic function is mildly reduced. Left Atrium: Left atrial size was normal in size. Right Atrium: Right atrial size was normal in size. Pericardium: There is no evidence of pericardial effusion. Mitral Valve: The mitral valve is grossly normal. No evidence of mitral valve stenosis. Tricuspid Valve: The tricuspid valve is normal in structure. Tricuspid valve regurgitation is not demonstrated. No evidence of tricuspid stenosis. Aortic Valve: The aortic valve is tricuspid. There is moderate aortic valve annular calcification. Aortic valve regurgitation is not visualized. Aortic valve sclerosis is present, with no evidence of aortic valve stenosis. Pulmonic Valve: The pulmonic valve was normal in structure. Pulmonic valve regurgitation is not visualized. No evidence of pulmonic stenosis. Venous: The inferior vena cava is normal in size with greater than 50% respiratory variability, suggesting right atrial pressure of 3 mmHg. IAS/Shunts: No atrial level shunt detected by color flow Doppler. Additional Comments: Spectral Doppler performed. Color Doppler performed.  LEFT VENTRICLE PLAX 2D LVIDd:         3.20 cm   Diastology LVIDs:         2.00 cm   LV e' medial:    5.00 cm/s LV PW:         1.10 cm   LV E/e' medial:  20.2 LV IVS:        1.20 cm   LV e' lateral:   5.33 cm/s LVOT diam:     2.00 cm   LV E/e' lateral: 18.9 LV SV:         92 LV SV Index:   47 LVOT Area:     3.14 cm  IVC IVC diam: 1.70 cm LEFT ATRIUM         Index LA diam:    3.80 cm 1.95 cm/m  AORTIC VALVE LVOT Vmax:   129.00 cm/s LVOT Vmean:  87.300 cm/s LVOT VTI:    0.293 m  AORTA Ao Asc diam:  3.60 cm MITRAL VALVE MV Area (PHT): 2.69 cm     SHUNTS MV Decel Time: 282 msec     Systemic VTI:  0.29 m MV E velocity: 101.00 cm/s  Systemic Diam: 2.00 cm MV A velocity: 151.00 cm/s MV E/A ratio:  0.67 Riley Lam MD Electronically signed by Riley Lam  MD Signature Date/Time: 01/15/2023/2:09:07 PM    Final        Assessment / Plan:    #22 82 year old white male was admitted 26 6 through 2 11 with sepsis, required ICU admission/pressors. Also  had massive elevation of troponins Extensive evaluation for source of sepsis including LP unremarkable Possibly secondary to ileitis which was noted on CT  Initial CT of the chest also raise concern for PE-he was started on Coumadin Transferred to rehab on 01/09/2023 Noted to have declining hemoglobin and heme positive  Etiology of the ileitis is not clear question infectious but cannot rule out IBD, had chronic complaint of loose stool  Etiology of the massive elevation of troponin is not entirely clear, limited echo today with EF 70 to 75% and severely dilated right atrium  #2 anemia acute on chronic currently stable in the 9 range #3-PE ultimately ruled out with repeat CTA  Coumadin stopped on 01/12/2023  #4 diabetes mellitus  Plan; patient needs to have colonoscopy at some point, this is not emergent  Cardiology is seeing currently and plan for further imaging either with repeat echo or MRI  GI will check back tomorrow      Principal Problem:   Sepsis (HCC) Active Problems:   Anemia   Fecal occult blood test positive   Acute pulmonary embolism (HCC)     LOS: 7 days   Amy Esterwood PA-C 01/16/2023, 3:10 PM

## 2023-01-16 NOTE — Consult Note (Addendum)
Cardiology Consultation   Patient ID: MAINOR DEWIT MRN: [id number]; DOB: Jan 05, 1941  Admit date: 01/09/2023 Date of Consult: 01/16/2023  PCP:  Jerl Mina, MD   [facility] Providers Cardiologist:  New   Patient Profile:   HOMAS FRANSSEN is a 82 y.o. male with a hx of HTN, remote syncope, DM  who is being seen 01/16/2023 for preop cardiac risk assessment at the request of Dr Carlis Abbott.  History of Present Illness:   Mr. Barwick is a 82 yo with hx of HTN, HL, PVCs, DM x years, remote syncope (2018), trigeminal neuralgia and  anemia.  2018 Episode of syncope.  No records   ? Related to infection as wife reports fever.  Review of records, pt had myoview, echo and holter, all reported normal     Pt admitted to [facility] on 11/6 after not feeling good for 1 wk (stomach, leg cramping, chills)   ON admit tachycardic, hypoxic.   Trop mildly elevated   Pt admitted to ICU  Rx with AVB, antiviral, pressors  Trop on 11/7 2150.   TTE showed normal LVEF   RV dilated with hypokinesis. (Consistent with McConnell sign)   CTA done  Poor quality   Suspicious for PE   Placed no IV heparin  Switched to warfarin on 11/9       Over next several days, pt's blood pressure meds resumed    Transferred to inpatient rehab service  Repeat CTA of lungs on 01/13/23 showed no evid for PE   Both CTs did show extensive coronary artery calcifications      Repeat limited echo on 11/17 showed  vigorous LV function LVEF 70 to 75%    RV severely enlarged  RVEF reported mildly down  The pt has been seen by GI for anemia, heme+ stool   Being evaluated for non-emergent colonoscopy  Per the pt, about 1 month ago he was feeling good   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   Denied CP  No dizziness or syncope   Breathing was ok Denied palpitations     He does says that he has had swelling in legs for years   He denies snoring but says his wife listens to him while sleeping to make sure he is breathing         Past Medical History:  Diagnosis Date   Abscess of back    Arthritis    Bacterial infection 2015   Cancer (HCC)    skin    Diabetes mellitus without complication (HCC)    Dysrhythmia    Edema    RIGHT ANKLE   GERD (gastroesophageal reflux disease)    Hypertension    Trigeminal nerve disorder    Trigeminal neuralgia     Past Surgical History:  Procedure Laterality Date   APPENDECTOMY  2010   CATARACT EXTRACTION W/PHACO Right 01/26/2017   Procedure: CATARACT EXTRACTION PHACO AND INTRAOCULAR LENS PLACEMENT (IOC);  Surgeon: Nevada Crane, MD;  Location: [facility] ORS;  Service: Ophthalmology;  Laterality: Right;  Lot # [id number] H [place]: 00:48.2 AP%: 9.9 CDE: 5.35   I&D of back abscess     TRIGEMINAL NERVE DECOMPRESSION       Home Medications:  Prior to Admission medications   Medication Sig Start Date End Date Taking? Authorizing Provider  acetaminophen (TYLENOL) 500 MG tablet Take 1,000 mg by mouth every 6 (six) hours as needed.    [provider]  amLODipine (NORVASC) 5 MG tablet Take 1  tablet by mouth daily. 05/23/22   [provider]  Apoaequorin 10 MG CAPS Take 1 capsule by mouth daily. Prevagen    [provider]  aspirin EC 81 MG tablet Take 325 mg by mouth daily.    [provider]  carbamazepine (CARBATROL) 200 MG 12 hr capsule Take 200 mg by mouth 4 (four) times daily.    [provider]  cholestyramine light (PREVALITE) 4 GM/DOSE powder Take 4 g by mouth 2 (two) times daily with a meal. 12/01/22   [provider]  gabapentin (NEURONTIN) 600 MG tablet Take 600 mg by mouth 3 (three) times daily. 12/19/22   [provider]  glucagon (GLUCAGON EMERGENCY) 1 MG injection Inject 1 mg into the skin. 06/18/13   [provider]  insulin aspart (NOVOLOG) 100 UNIT/ML injection Inject 3 Units into the skin 3 (three) times daily with meals. 01/09/23   [place], Eric J, DO  insulin glargine-yfgn (SEMGLEE) 100  UNIT/ML injection Inject 0.05 mLs (5 Units total) into the skin daily. 01/10/23   [place], Alvira Philips, DO  metroNIDAZOLE (FLAGYL) 500 MG tablet Take 1 tablet (500 mg total) by mouth every 12 (twelve) hours. 01/09/23   [place], Alvira Philips, DO  Multiple Vitamin (MULTI-VITAMIN) tablet Take 1 tablet by mouth daily.    [provider]  omeprazole (PRILOSEC) 20 MG capsule Take 20 mg by mouth daily.    [provider]  rosuvastatin (CRESTOR) 10 MG tablet Take 1 tablet by mouth daily. 11/24/22   [provider]    Inpatient Medications: Scheduled Meds:  acarbose  25 mg Oral TID WC   amLODipine  5 mg Oral Daily   carbamazepine  200 mg Oral QID   gabapentin  600 mg Oral TID   insulin aspart  0-5 Units Subcutaneous QHS   insulin aspart  0-9 Units Subcutaneous TID WC   insulin aspart  3 Units Subcutaneous TID WC   [START ON 01/17/2023] insulin glargine-yfgn  7 Units Subcutaneous Daily   losartan  25 mg Oral Daily   magnesium gluconate  500 mg Oral QHS   pantoprazole  40 mg Oral Daily   rosuvastatin  10 mg Oral Daily   Continuous Infusions:  PRN Meds: acetaminophen, alum & mag hydroxide-simeth, bisacodyl, diphenhydrAMINE, guaiFENesin-dextromethorphan, methocarbamol, ondansetron **OR** ondansetron (ZOFRAN) IV, oxyCODONE, polyethylene glycol, sodium chloride flush, sodium phosphate  Allergies:   No Known Allergies  Social History:   Social History   Socioeconomic History   Marital status: Married    Spouse name: Not on file   Number of children: Not on file   Years of education: Not on file   Highest education level: Not on file  Occupational History   Not on file  Tobacco Use   Smoking status: Never   Smokeless tobacco: Never  Vaping Use   Vaping status: Never Used  Substance and Sexual Activity   Alcohol use: No    Alcohol/week: 0.0 standard drinks of alcohol   Drug use: No   Sexual activity: Not on file  Other Topics Concern   Not on file  Social History  Narrative   Not on file   Social Determinants of Health   Financial Resource Strain: Patient Declined (11/16/2022)   Received from [facility]   Overall Financial Resource Strain (CARDIA)    Difficulty of Paying Living Expenses: Patient declined  Food Insecurity: No Food Insecurity (01/09/2023)   Hunger Vital Sign    Worried About Radiation protection practitioner of Food  in the Last Year: Never true    Ran Out of Food in the Last Year: Never true  Transportation Needs: No Transportation Needs (01/09/2023)   PRAPARE - Administrator, Civil Service (Medical): No    Lack of Transportation (Non-Medical): No  Physical Activity: Not on file  Stress: Not on file  Social Connections: Not on file  Intimate Partner Violence: Not At Risk (01/09/2023)   Humiliation, Afraid, Rape, and Kick questionnaire    Fear of Current or Ex-Partner: No    Emotionally Abused: No    Physically Abused: No    Sexually Abused: No    Family History:    Family History  Problem Relation Age of Onset   Heart disease Father      ROS:  Please see the history of present illness.   All other ROS reviewed and negative.     Physical Exam/Data:   Vitals:   01/15/23 1308 01/15/23 1951 01/16/23 0509 01/16/23 1326  BP: 138/63 (!) 140/67 (!) 145/73 121/66  Pulse: 77 72 72 77  Resp: 18 18 18 18   Temp: 98.1 F (36.7 C) 98.3 F (36.8 C) 98.2 F (36.8 C) 98.1 F (36.7 C)  TempSrc: Oral     SpO2: 96% 99% 96% 99%  Weight:      Height:        Intake/Output Summary (Last 24 hours) at 01/16/2023 1456 Last data filed at 01/16/2023 1308 Gross per 24 hour  Intake 657 ml  Output 200 ml  Net 457 ml      01/13/2023   12:00 PM 01/08/2023    5:25 AM 01/07/2023    6:23 AM  Last 3 Weights  Weight (lbs) 187 lb 2.7 oz 187 lb 2.7 oz 188 lb 15 oz  Weight (kg) 84.9 kg 84.9 kg 85.7 kg     Body mass index is 30.21 kg/m.  General:  Well nourished, well developed, in no acute distress HEENT: normal Neck: no  JVD Vascular: No carotid bruits; Cardiac:  normal S1, S2; RRR; no murmur  Lungs:  clear to auscultation bilaterally, no wheezing, rhonchi or rales  Abd: soft, nontender, no hepatomegaly  Ext: 1-2+ LE edema Musculoskeletal:  No deformities, BUE and BLE strength normal and equal Skin: warm and dry  Neuro:  CNs 2-12 intact, no focal abnormalities noted Psych:  Normal affect   EKG:  The EKG was personally reviewed and demonstrates:  01/04/23  NSR   Telemetry:  Telemetry was personally reviewed and demonstrates:  SR   Relevant CV Studies: Echo  01/15/23   1. Left ventricular ejection fraction, by estimation, is 70 to 75%. The  left ventricle has hyperdynamic function. The left ventricle has no  regional wall motion abnormalities. There is mild asymmetric left  ventricular hypertrophy of the basal-septal  segment. Indeterminate diastolic filling due to E-A fusion.   2. Right ventricular systolic function is mildly reduced. The right  ventricular size is severely enlarged.   3. The mitral valve is grossly normal. No evidence of mitral valve  regurgitation. No evidence of mitral stenosis.   4. The aortic valve is tricuspid. Aortic valve regurgitation is not  visualized. Aortic valve sclerosis is present, with no evidence of aortic  valve stenosis.   5. The inferior vena cava is normal in size with greater than 50%  respiratory variability, suggesting right atrial pressure of 3 mmHg.   Laboratory Data:  High Sensitivity Troponin:   Recent Labs  Lab 01/04/23 0049 01/04/23 0240  01/04/23 1352 01/05/23 0355  TROPONINIHS 47* 77* 3,448* 2,150*     Chemistry Recent Labs  Lab 01/10/23 0452 01/16/23 0614  NA 137 137  K 3.8 3.6  CL 108 104  CO2 21* 28  GLUCOSE 147* 173*  BUN 12 9  CREATININE 0.84 0.72  CALCIUM 8.0* 8.0*  GFRNONAA >60 >60  ANIONGAP 8 5    Recent Labs  Lab 01/10/23 0452  PROT 4.7*  ALBUMIN 2.4*  AST 31  ALT 36  ALKPHOS 52  BILITOT 0.7   Lipids No  results for input(s): "CHOL", "TRIG", "HDL", "LABVLDL", "LDLCALC", "CHOLHDL" in the last 168 hours.  Hematology Recent Labs  Lab 01/14/23 0530 01/15/23 0459 01/16/23 0614  WBC 7.3 8.4 7.2  RBC 2.94* 2.89* 2.91*  HGB 9.0* 8.9* 9.0*  HCT 27.6* 27.4* 27.8*  MCV 93.9 94.8 95.5  MCH 30.6 30.8 30.9  MCHC 32.6 32.5 32.4  RDW 12.9 12.9 12.8  PLT 503* 484* 578*   Thyroid No results for input(s): "TSH", "FREET4" in the last 168 hours.  BNPNo results for input(s): "BNP", "PROBNP" in the last 168 hours.  DDimer No results for input(s): "DDIMER" in the last 168 hours.   Radiology/Studies:  ECHOCARDIOGRAM LIMITED  Result Date: 01/15/2023    ECHOCARDIOGRAM LIMITED REPORT   Patient Name:   SAMUELA DERINGER Date of Exam: 01/15/2023 Medical Rec #:  [id number]       Height:       66.0 in Accession #:    [id number]      Weight:       187.2 lb Date of Birth:  12/11/1940      BSA:          1.944 m Patient Age:    81 years        BP:           154/74 mmHg Patient Gender: M               HR:           79 bpm. Exam Location:  Inpatient Procedure: Limited Echo, Color Doppler and Cardiac Doppler Indications:    other abnormalities of the heart  History:        Patient has prior history of Echocardiogram examinations, most                 recent 01/05/2023. Risk Factors:Hypertension, Dyslipidemia and                 Diabetes.  Sonographer:    Delcie Roch RDCS Referring Phys: [id number] PAMELA S LOVE IMPRESSIONS  1. Left ventricular ejection fraction, by estimation, is 70 to 75%. The left ventricle has hyperdynamic function. The left ventricle has no regional wall motion abnormalities. There is mild asymmetric left ventricular hypertrophy of the basal-septal segment. Indeterminate diastolic filling due to E-A fusion.  2. Right ventricular systolic function is mildly reduced. The right ventricular size is severely enlarged.  3. The mitral valve is grossly normal. No evidence of mitral valve regurgitation. No evidence of  mitral stenosis.  4. The aortic valve is tricuspid. Aortic valve regurgitation is not visualized. Aortic valve sclerosis is present, with no evidence of aortic valve stenosis.  5. The inferior vena cava is normal in size with greater than 50% respiratory variability, suggesting right atrial pressure of 3 mmHg. Comparison(s): No significant change from prior study. Prior images reviewed side by side. FINDINGS  Left Ventricle: Left ventricular ejection fraction, by estimation, is 70 to 75%.  The left ventricle has hyperdynamic function. The left ventricle has no regional wall motion abnormalities. There is mild asymmetric left ventricular hypertrophy of the basal-septal segment. Indeterminate diastolic filling due to E-A fusion. Right Ventricle: The right ventricular size is severely enlarged. Right ventricular systolic function is mildly reduced. Left Atrium: Left atrial size was normal in size. Right Atrium: Right atrial size was normal in size. Pericardium: There is no evidence of pericardial effusion. Mitral Valve: The mitral valve is grossly normal. No evidence of mitral valve stenosis. Tricuspid Valve: The tricuspid valve is normal in structure. Tricuspid valve regurgitation is not demonstrated. No evidence of tricuspid stenosis. Aortic Valve: The aortic valve is tricuspid. There is moderate aortic valve annular calcification. Aortic valve regurgitation is not visualized. Aortic valve sclerosis is present, with no evidence of aortic valve stenosis. Pulmonic Valve: The pulmonic valve was normal in structure. Pulmonic valve regurgitation is not visualized. No evidence of pulmonic stenosis. Venous: The inferior vena cava is normal in size with greater than 50% respiratory variability, suggesting right atrial pressure of 3 mmHg. IAS/Shunts: No atrial level shunt detected by color flow Doppler. Additional Comments: Spectral Doppler performed. Color Doppler performed.  LEFT VENTRICLE PLAX 2D LVIDd:         3.20 cm    Diastology LVIDs:         2.00 cm   LV e' medial:    5.00 cm/s LV PW:         1.10 cm   LV E/e' medial:  20.2 LV IVS:        1.20 cm   LV e' lateral:   5.33 cm/s LVOT diam:     2.00 cm   LV E/e' lateral: 18.9 LV SV:         92 LV SV Index:   47 LVOT Area:     3.14 cm  IVC IVC diam: 1.70 cm LEFT ATRIUM         Index LA diam:    3.80 cm 1.95 cm/m  AORTIC VALVE LVOT Vmax:   129.00 cm/s LVOT Vmean:  87.300 cm/s LVOT VTI:    0.293 m  AORTA Ao Asc diam: 3.60 cm MITRAL VALVE MV Area (PHT): 2.69 cm     SHUNTS MV Decel Time: 282 msec     Systemic VTI:  0.29 m MV E velocity: 101.00 cm/s  Systemic Diam: 2.00 cm MV A velocity: 151.00 cm/s MV E/A ratio:  0.67 Riley Lam MD Electronically signed by Riley Lam MD Signature Date/Time: 01/15/2023/2:09:07 PM    Final    CT Angio Chest Pulmonary Embolism (PE) W or WO Contrast  Result Date: 01/13/2023 CLINICAL DATA:  Pulmonary embolism (PE) suspected, high prob. Follow-up previously questioned left upper and left lower lobe pulmonary emboli. EXAM: CT ANGIOGRAPHY CHEST WITH CONTRAST TECHNIQUE: Multidetector CT imaging of the chest was performed using the standard protocol during bolus administration of intravenous contrast. Multiplanar CT image reconstructions and MIPs were obtained to evaluate the vascular anatomy. RADIATION DOSE REDUCTION: This exam was performed according to the departmental dose-optimization program which includes automated exposure control, adjustment of the mA and/or kV according to patient size and/or use of iterative reconstruction technique. CONTRAST:  75mL OMNIPAQUE IOHEXOL 350 MG/ML SOLN COMPARISON:  CTA chest 01/04/2023. FINDINGS: Cardiovascular: Satisfactory opacification of the pulmonary arteries to the segmental level. No evidence of pulmonary embolism. Normal heart size. No pericardial effusion. Extensive coronary artery calcifications and atherosclerotic calcifications of the thoracic aorta. Mediastinum/Nodes: No enlarged  mediastinal, hilar, or  axillary lymph nodes. Thyroid gland, trachea, and esophagus demonstrate no significant findings. Lungs/Pleura: No consolidation or pulmonary edema. Trace left pleural effusion. No pneumothorax. Upper Abdomen: No acute abnormality. Musculoskeletal: No chest wall abnormality. No acute or significant osseous findings. Review of the MIP images confirms the above findings. IMPRESSION: 1. No evidence of pulmonary embolism. 2. Trace left pleural effusion. 3. Extensive coronary artery calcifications. Aortic Atherosclerosis (ICD10-I70.0). Electronically Signed   By: Orvan Falconer M.D.   On: 01/13/2023 17:29   MR FEMUR LEFT WO CONTRAST  Result Date: 01/13/2023 CLINICAL DATA:  Left thigh pain and weakness. EXAM: MR OF THE LEFT FEMUR WITHOUT CONTRAST TECHNIQUE: Multiplanar, multisequence MR imaging of the left thigh was performed. No intravenous contrast was administered. COMPARISON:  Left femur x-rays dated January 06, 2023. FINDINGS: Bones/Joint/Cartilage No marrow signal abnormality. No fracture or dislocation. Mild bilateral hip degenerative changes. Prior left knee medial unicompartmental arthroplasty. Small bilateral knee joint effusions. Ligaments Collateral ligaments are intact. Muscles and Tendons Intact. Scattered muscle edema within the left thigh, predominantly involving the sartorius, vastus medialis, and vastus lateralis muscles. There is also involvement of the distal left iliopsoas muscle. Mild fatty infiltration of the left thigh muscles, with more focal atrophy of the adductor magnus and rectus femoris muscles. No significant muscle edema in the visualized right thigh on the coronal sequences. Soft tissue Mild thigh soft tissue swelling, greatest laterally. No fluid collection or hematoma. No soft tissue mass. Moderate right and trace left hydroceles. IMPRESSION: 1. Scattered muscle edema within the left thigh, predominantly involving the sartorius, vastus medialis, and vastus  lateralis muscles. There is also involvement of the distal left iliopsoas muscle. Findings are nonspecific and may be related to myositis or denervation. 2. No acute osseous abnormality. Electronically Signed   By: Obie Dredge M.D.   On: 01/13/2023 12:12     Assessment and Plan:   RV dysfunction  Recent echoes here in the hospital have shown severe RV dilation   RV function depressed on initial echo;   Repeat echo it also appears down although on my review difficult to quantify as endocardium not well seen   Would recomm limited echo with Definity  Estimated PAP does not appear to be elevated but would consider R heart cath to confirm  This can be done as an outpt There is no significant TR      Would also recomm outpt sleep evaluation  ? Central sleep apnea  Pt with chronic LE edema   Follow for now    2  CAD  Pt with extensive coronary calcifications.   Denies CP  Says he was active about 1 month ago    Pt did have significant trop elevation early in admit when septic, on pressors  (peak 3448)   LVEF vigorous   Pt denies CP  Follow for now  Can be evaluated as outpatient   3  Preop assessment   Question re risk for elective colonoscopy   As not emergent, I would delay until RV evaluated further   4   HTN  BP is under fair control    5   HL Would check lipids in AM    6   DM  A1C 7.6  Needs tighter control   For questions or updates, please contact [facility] Please consult [email or web address] for contact info under    Signed, Dietrich Pates, MD  01/16/2023 2:56 PM

## 2023-01-16 NOTE — Progress Notes (Signed)
Occupational Therapy Discharge Summary  Patient Details  Name: Larry Lane MRN: [id number] Date of Birth: Jul 12, 1940  Date of Discharge from OT service:January 16, 2023  Today's Date: 01/16/2023 OT Individual Time: 1105-1200 OT Individual Time Calculation (min): 55 min   Skilled Therapeutic Intervention:  Patient received seated in wheelchair, agreeable to shower.  Wife not present this session.  Patient ambulated to bathroom with close supervision.  Stood near grab bar to remove LB clothing.  Patient encouraged to sit on shower bench.  Patient had left knee buckle, and had near fall - patient assisted back to full standing, and discussed situation.  Patient indicates he turned the wrong way.  Messaged PA and MD to let them know and to ensure left knee which was in increased flexion was not injured.  Patient denies pain.  Patient able to shower without physical assistance.  Patient able to transition sit to stand multiple times during this session without loss of balance, or report of pain.  Patient has met OT goals at this time.    Patient has met 10 of 10 long term goals due to improved activity tolerance, improved balance, ability to compensate for deficits, functional use of  LEFT lower extremity, improved attention, and improved awareness.  Patient to discharge at overall Supervision level.  Patient's care partner is independent to provide the necessary physical and cognitive assistance at discharge.    Reasons goals not met: NA  Recommendation:  Patient will benefit from ongoing skilled OT services in outpatient setting to continue to advance functional skills in the area of BADL, iADL, and Reduce care partner burden.  Equipment: No equipment provided  Reasons for discharge: treatment goals met and discharge from hospital  Patient/family agrees with progress made and goals achieved: Yes  OT Discharge Precautions/Restrictions  Precautions Precautions: Fall Precaution  Comments: L knee buckle Restrictions Weight Bearing Restrictions: No  Pain Pain Assessment Pain Score: 0-No pain ADL ADL Eating: Independent Where Assessed-Eating: Wheelchair Grooming: Independent Where Assessed-Grooming: Sitting at sink Upper Body Bathing: Independent Where Assessed-Upper Body Bathing: Shower Lower Body Bathing: Modified independent Where Assessed-Lower Body Bathing: Shower Upper Body Dressing: Independent Where Assessed-Upper Body Dressing: Sitting at sink Lower Body Dressing: Modified independent Where Assessed-Lower Body Dressing: Sitting at sink, Standing at sink Toileting: Supervision/safety Where Assessed-Toileting: Teacher, adult education: Close supervision Toilet Transfer Method: Stand pivot, Proofreader: Gaffer: Unable to assess Film/video editor: Close supervision Film/video editor Method: Warden/ranger: Shower seat with back ADL Comments: Pt mod A LB self care due to L Le weakness, needs min a for SPT with RW to access toilet, w/c and stall shower with DME and began instruction with reacher for LB simple dressing Vision Baseline Vision/History: 1 Wears glasses Patient Visual Report: No change from baseline Vision Assessment?: No apparent visual deficits Perception  Perception: Within Functional Limits Praxis Praxis: WFL Cognition Cognition Overall Cognitive Status: History of cognitive impairments - at baseline Arousal/Alertness: Awake/alert Orientation Level: Person;Place;Situation Memory: Impaired Memory Impairment: Retrieval deficit;Decreased recall of new information;Decreased short term memory Decreased Short Term Memory: Functional basic Attention: Selective Focused Attention Impairment: Functional basic Selective Attention Impairment: Functional basic Alternating Attention Impairment: Functional basic Awareness: Impaired Awareness Impairment:  Emergent impairment Problem Solving: Impaired Problem Solving Impairment: Functional basic Executive Function: Organizing;Decision Making Organizing: Impaired Decision Making: Impaired Safety/Judgment: Appears intact Brief Interview for Mental Status (BIMS) Repetition of Three Words (First Attempt): 3 Temporal Orientation: Year: Correct Temporal Orientation: Month: Accurate  within 5 days Temporal Orientation: Day: Correct Recall: "Sock": Yes, no cue required Recall: "Blue": Yes, no cue required Recall: "Bed": Yes, no cue required BIMS Summary Score: 15 Sensation Sensation Light Touch: Appears Intact Hot/Cold: Appears Intact Proprioception: Appears Intact Stereognosis: Appears Intact Additional Comments: 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 Coordination Gross Motor Movements are Fluid and Coordinated: No Fine Motor Movements are Fluid and Coordinated: No Coordination and Movement Description: General deconditioning and weakness with new LLE weakness Motor  Motor Motor - Skilled Clinical Observations: generalized weakness, unbalanced quad activation demonstrates buckling with LLE in stance phase during functional upright mobility Mobility  Bed Mobility Bed Mobility: Rolling Right;Rolling Left;Left Sidelying to Sit;Supine to Sit Rolling Right: Independent Rolling Left: Independent Left Sidelying to Sit: Independent Supine to Sit: Independent Sit to Supine: Independent Transfers Sit to Stand: Supervision/Verbal cueing Stand to Sit: Supervision/Verbal cueing  Trunk/Postural Assessment  Cervical Assessment Cervical Assessment: Within Functional Limits Thoracic Assessment Thoracic Assessment: Within Functional Limits Lumbar Assessment Lumbar Assessment: Within Functional Limits Postural Control Postural Control: Within Functional Limits Protective Responses: decreased Postural Limitations: decreased  Balance Balance Balance Assessed: Yes Standardized  Balance Assessment Standardized Balance Assessment: Timed Up and Go Test Timed Up and Go Test TUG: Normal TUG;Cognitive TUG Normal TUG (seconds): 28 Cognitive TUG (seconds): 36.5 Static Sitting Balance Static Sitting - Balance Support: Feet supported;No upper extremity supported Static Sitting - Level of Assistance: 7: Independent Dynamic Sitting Balance Dynamic Sitting - Balance Support: Feet supported Static Standing Balance Static Standing - Balance Support: Right upper extremity supported;Left upper extremity supported;During functional activity Static Standing - Level of Assistance: 6: Modified independent (Device/Increase time) Dynamic Standing Balance Dynamic Standing - Balance Support: Bilateral upper extremity supported;During functional activity Dynamic Standing - Level of Assistance: 5: Stand by assistance Extremity/Trunk Assessment RUE Assessment RUE Assessment: Within Functional Limits LUE Assessment LUE Assessment: Within Functional Limits   Collier Salina 01/16/2023, 12:54 PM

## 2023-01-16 NOTE — Progress Notes (Signed)
Physical Therapy Session Note  Patient Details  Name: Larry Lane MRN: [id number] Date of Birth: 1941-01-12  Today's Date: 01/16/2023 PT Individual Time: 2595-6387 + 5643-3295 PT Individual Time Calculation (min): 71 min + 72 min  Short Term Goals: Week 1:  PT Short Term Goal 1 (Week 1): STG = LTG due to ELOS  Skilled Therapeutic Interventions/Progress Updates:      1st session:  Pt sitting in w/c to start with LPN giving morning medications. Wife in the room who provided measurements for doorway (34") and steps (7" + 6.5") for entrance. They still have the portable ramp but wanting to work on getting off the ramp. Recommend ramped entrance at least for temporary needs, both voice understanding.   Pt requesting to use the toilet before leaving his room - sit<>stand to RW mod I. Ambulates with supervision and RW into the bathroom and is continent of bladder void while standing with RW support at supervision level. Returned to his w/c with supervision and RW.   Transported to main rehab gym for time.  Sit<>stand to RW mod I from w/c height - ambulates 242ft with supervision and RW in rehab gyms and hallways - cues for bringing awareness to LLE to make sure no buckling occurs.   Instructed patient in stair training using 6" steps and 2 hand rails - completed with CGA while forward facing with a step to pattern. No knee buckling of LLE while doing the steps - continued cues for awareness to make sure he avoid too much weight bearing on LLE to cause buckle.   Curb training using 8" curb and RW. Demonstration provided to ensure understanding of technique and safety. Pt completed x4 times up/down navigating the 8" curb with CGA for safety. Min cues for technique and setup to make sure his feet are as close as possible to the curb before lifting up the RW onto the curb.   Pt instructed in normal and cognitive TUG: Normal TUG: 28 seconds Cognitive TUG (counting back from 100 by 3's): 36.5  seconds  Car transfer completed with car height simulating their [employer] - set to lowest poisition. Patient completed ambulatory car transfer with supervision assist with cues for safety approach and sitting before getting legs in the vehicle.   Practiced navigating a ~51ft ramp with CGA and RW - cues for safety awareness and making sure LLE doesn't buckle. Also needed safety cues for managing the threshold at the ramp to avoid picking up walker and placing too far away from his body.   Pt returned to his room and left sitting upright in w/c with safety belt alarm on, call bell in reach - made aware of upcoming therapy schedule.   2nd session: Pt sitting in wheelchair to start - no reports of pain. Transported in w/c to main rehab gym for time management.   Sit<>stand mod I From w/c to RW - completes stand pivot transfer with supervision using RW to mat table. Sit.Supine on mat table with supervision and then discussed DC planning, home safety, fall prevention, etc. Developed HEP and printed a handout to provide for patient. He completed 2 set of each exercise on HEP for demonstration and understanding. See below for details.  Pt assisted onto Nustep and he completed 10 minutes at L5 resistance using BUE/BLE - encouraged pacing activity but having adequate cadence to challenge endurance and strengthening. He achieved a total of 483 steps within the 10 minutes.   Access Code: [email or web address] URL: [email or web address] Date:  01/16/2023 Prepared by: Wynelle Link  Exercises - Supine Bridge with Resistance Band  - 1 x daily - 7 x weekly - 3 sets - 10 reps - Clamshell with Resistance  - 1 x daily - 7 x weekly - 3 sets - 10 reps - Supine Knee Extension Strengthening (Active Assist for LLE)  - 1 x daily - 7 x weekly - 3 sets - 10 reps - Supine Quad Set  - 1 x daily - 7 x weekly - 3 sets - 10 reps - Supine Gluteal Sets  - 1 x daily - 7 x weekly - 3 sets - 10 reps - Supine Heel  Slides  - 1 x daily - 7 x weekly - 3 sets - 10 reps - Supine Ankle Pumps  - 1 x daily - 7 x weekly - 3 sets - 10 reps - Supine Hip Abduction  - 1 x daily - 7 x weekly - 3 sets - 10 reps  Returned to his room and patient left sitting upright in w/c with safety belt alarm on and direct handoff of care to MD from cardiology. All needs met.   Therapy Documentation Precautions:  Precautions Precautions: Fall Precaution Comments: L knee buckle Restrictions Weight Bearing Restrictions: No General:      Therapy/Group: Individual Therapy  Latrena Benegas P Zarie Kosiba  PT, DPT, CSRS  01/16/2023, 9:43 AM

## 2023-01-17 ENCOUNTER — Inpatient Hospital Stay (HOSPITAL_COMMUNITY): Payer: Medicare Other

## 2023-01-17 ENCOUNTER — Other Ambulatory Visit: Payer: Self-pay | Admitting: Cardiology

## 2023-01-17 ENCOUNTER — Other Ambulatory Visit (HOSPITAL_COMMUNITY): Payer: Self-pay

## 2023-01-17 DIAGNOSIS — I2510 Atherosclerotic heart disease of native coronary artery without angina pectoris: Secondary | ICD-10-CM

## 2023-01-17 DIAGNOSIS — I50810 Right heart failure, unspecified: Secondary | ICD-10-CM | POA: Diagnosis not present

## 2023-01-17 DIAGNOSIS — R6520 Severe sepsis without septic shock: Secondary | ICD-10-CM | POA: Diagnosis not present

## 2023-01-17 DIAGNOSIS — R008 Other abnormalities of heart beat: Secondary | ICD-10-CM | POA: Diagnosis not present

## 2023-01-17 DIAGNOSIS — N179 Acute kidney failure, unspecified: Secondary | ICD-10-CM | POA: Diagnosis not present

## 2023-01-17 DIAGNOSIS — A419 Sepsis, unspecified organism: Secondary | ICD-10-CM | POA: Diagnosis not present

## 2023-01-17 LAB — CBC
HCT: 31.2 % — ABNORMAL LOW (ref 39.0–52.0)
Hemoglobin: 10.4 g/dL — ABNORMAL LOW (ref 13.0–17.0)
MCH: 31.5 pg (ref 26.0–34.0)
MCHC: 33.3 g/dL (ref 30.0–36.0)
MCV: 94.5 fL (ref 80.0–100.0)
Platelets: 731 10*3/uL — ABNORMAL HIGH (ref 150–400)
RBC: 3.3 MIL/uL — ABNORMAL LOW (ref 4.22–5.81)
RDW: 12.8 % (ref 11.5–15.5)
WBC: 8.4 10*3/uL (ref 4.0–10.5)
nRBC: 0 % (ref 0.0–0.2)

## 2023-01-17 LAB — ECHOCARDIOGRAM LIMITED
Height: 66 in
S' Lateral: 3 cm
Weight: 2994.73 [oz_av]

## 2023-01-17 LAB — PROTIME-INR
INR: 1.2 (ref 0.8–1.2)
Prothrombin Time: 15.8 s — ABNORMAL HIGH (ref 11.4–15.2)

## 2023-01-17 LAB — GLUCOSE, CAPILLARY
Glucose-Capillary: 168 mg/dL — ABNORMAL HIGH (ref 70–99)
Glucose-Capillary: 153 mg/dL — ABNORMAL HIGH (ref 70–99)

## 2023-01-17 MED ORDER — ACARBOSE 25 MG PO TABS: 25.0000 mg | ORAL_TABLET | Freq: Three times a day (TID) | ORAL | 0 refills | Status: DC

## 2023-01-17 MED ORDER — ACETAMINOPHEN 325 MG PO TABS: 325.0000 mg | ORAL_TABLET | ORAL | Status: AC | PRN

## 2023-01-17 MED ORDER — LOSARTAN POTASSIUM 25 MG PO TABS: 25.0000 mg | ORAL_TABLET | Freq: Every day | ORAL | 0 refills | Status: DC

## 2023-01-17 NOTE — Progress Notes (Signed)
Patient ID: Larry Lane, male   DOB: April 24, 1940, 82 y.o.   MRN: [id number]  Brief GI note  INR 1.2 Hemoglobin 10.4 stable  Cardiology is pursuing further cardiac evaluation with cardiac PET/CT  We do not plan on pursuing colonoscopy at this time, this can be done as an outpatient. Will arrange for an outpatient follow-up appointment with Dr. Tomasa Rand.

## 2023-01-17 NOTE — Progress Notes (Signed)
Per Dr. Tenny Craw- ordered PET CT for evaluation of ischemia. Patient admitted with sepsis and required pressors- hsTn peaked at 3448. CTA PE on 11/15 noted extensive coronary artery calcifications.   Jonita Albee, PA-C 01/17/2023 11:54 AM

## 2023-01-17 NOTE — Progress Notes (Addendum)
Inpatient Rehabilitation Care Coordinator Discharge Note   Patient Details  Name: Larry Lane MRN: [id number] Date of Birth: [birth year]   Discharge location: HOME WITH WIFE WHO IS ABLE TO PROVIDE SUPERVISION LEVEL  Length of Stay: 8 DAYS  Discharge activity level: SUPERVISION WITH CUES  Home/community participation: ACTIVE  Patient response YQ:MVHQIO Literacy - How often do you need to have someone help you when you read instructions, pamphlets, or other written material from your doctor or pharmacy?: Never  Patient response NG:EXBMWU Isolation - How often do you feel lonely or isolated from those around you?: Never  Services provided included: MD, RD, PT, OT, SLP, RN, CM, Pharmacy, Neuropsych, SW  Financial Services:  Field seismologist Utilized: Marine scientist MEDICARE  Choices offered to/list presented to: PT AND WIFE  Follow-up services arranged:  Outpatient, Patient/Family has no preference for HH/DME agencies    Outpatient Servicies: [facility]PP[facility], OT SP WILL CALL WIFE TO SET UP FOLLOW UP APPOINTMENTS  ADDENDHUM: INFORMED NEEDS HOME EXERCISE PROGRAM SO CANCELLED OP REFERRAL  HAS ALL EQUIPMENT FROM PAST ADMISSIONS    Patient response to transportation need: Is the patient able to respond to transportation needs?: Yes In the past 12 months, has lack of transportation kept you from medical appointments or from getting medications?: No In the past 12 months, has lack of transportation kept you from meetings, work, or from getting things needed for daily living?: No   Patient/Family verbalized understanding of follow-up arrangements:  Yes  Individual responsible for coordination of the follow-up plan: PEGGY-WIFE  [phone]  Confirmed correct DME delivered: Lucy Chris 01/17/2023    Comments (or additional information):WIFE STAYED HERE WITH HUSBAND AND OBSERVED HIM IN THERAPIES. BOTH FEEL READY TO GO HOME  Summary of Stay     Date/Time Discharge Planning CSW  01/11/23 [phone] Home with wife who has been staying here with him, he is doing well and wants to be home by birthday next Thursday RGD       Caty Tessler, Lemar Livings

## 2023-01-17 NOTE — Progress Notes (Addendum)
Rounding Note    Patient Name: Larry Lane Date of Encounter: 01/17/2023  [facility] Cardiologist  new   Subjective   Denies CP  Breathing is OK   Inpatient Medications    Scheduled Meds:  acarbose  25 mg Oral TID WC   amLODipine  5 mg Oral Daily   carbamazepine  200 mg Oral QID   gabapentin  600 mg Oral TID   insulin aspart  0-5 Units Subcutaneous QHS   insulin aspart  0-9 Units Subcutaneous TID WC   insulin aspart  3 Units Subcutaneous TID WC   insulin glargine-yfgn  7 Units Subcutaneous Daily   losartan  25 mg Oral Daily   magnesium gluconate  500 mg Oral QHS   pantoprazole  40 mg Oral Daily   rosuvastatin  10 mg Oral Daily   Continuous Infusions:  PRN Meds: acetaminophen, alum & mag hydroxide-simeth, bisacodyl, diphenhydrAMINE, guaiFENesin-dextromethorphan, methocarbamol, ondansetron **OR** ondansetron (ZOFRAN) IV, oxyCODONE, polyethylene glycol, sodium chloride flush, sodium phosphate   Vital Signs    Vitals:   01/16/23 0509 01/16/23 1326 01/16/23 1947 01/17/23 0526  BP: (!) 145/73 121/66 125/66 (!) 145/75  Pulse: 72 77 66 71  Resp: 18 18 18 17   Temp: 98.2 F (36.8 C) 98.1 F (36.7 C) 98.1 F (36.7 C) 98 F (36.7 C)  TempSrc:  Oral    SpO2: 96% 99% 95% 96%  Weight:      Height:        Intake/Output Summary (Last 24 hours) at 01/17/2023 1133 Last data filed at 01/17/2023 0757 Gross per 24 hour  Intake 900 ml  Output 400 ml  Net 500 ml      01/13/2023   12:00 PM 01/08/2023    5:25 AM 01/07/2023    6:23 AM  Last 3 Weights  Weight (lbs) 187 lb 2.7 oz 187 lb 2.7 oz 188 lb 15 oz  Weight (kg) 84.9 kg 84.9 kg 85.7 kg      Telemetry    SR  - Personally Reviewed  ECG    No new  - Personally Reviewed  Physical Exam   GEN: No acute distress.   Neck: No JVD Cardiac: RRR, no murmur.  Respiratory: Clear to auscultation  GI: Soft, nontender, non-distended  MS: Tr to 1+ LE  edema;  Labs    High Sensitivity Troponin:    Recent Labs  Lab 01/04/23 0049 01/04/23 0240 01/04/23 1352 01/05/23 0355  TROPONINIHS 47* 77* 3,448* 2,150*     Chemistry Recent Labs  Lab 01/16/23 0614  NA 137  K 3.6  CL 104  CO2 28  GLUCOSE 173*  BUN 9  CREATININE 0.72  CALCIUM 8.0*  GFRNONAA >60  ANIONGAP 5    Lipids No results for input(s): "CHOL", "TRIG", "HDL", "LABVLDL", "LDLCALC", "CHOLHDL" in the last 168 hours.  Hematology Recent Labs  Lab 01/15/23 0459 01/16/23 0614 01/17/23 0703  WBC 8.4 7.2 8.4  RBC 2.89* 2.91* 3.30*  HGB 8.9* 9.0* 10.4*  HCT 27.4* 27.8* 31.2*  MCV 94.8 95.5 94.5  MCH 30.8 30.9 31.5  MCHC 32.5 32.4 33.3  RDW 12.9 12.8 12.8  PLT 484* 578* 731*   Thyroid No results for input(s): "TSH", "FREET4" in the last 168 hours.  BNPNo results for input(s): "BNP", "PROBNP" in the last 168 hours.  DDimer No results for input(s): "DDIMER" in the last 168 hours.   Radiology    ECHOCARDIOGRAM LIMITED  Result Date: 01/15/2023    ECHOCARDIOGRAM LIMITED REPORT  Patient Name:   Larry Lane Date of Exam: 01/15/2023 Medical Rec #:  [id number]       Height:       66.0 in Accession #:    [id number]      Weight:       187.2 lb Date of Birth:  January 25, 1941      BSA:          1.944 m Patient Age:    82 years        BP:           154/74 mmHg Patient Gender: M               HR:           79 bpm. Exam Location:  Inpatient Procedure: Limited Echo, Color Doppler and Cardiac Doppler Indications:    other abnormalities of the heart  History:        Patient has prior history of Echocardiogram examinations, most                 recent 01/05/2023. Risk Factors:Hypertension, Dyslipidemia and                 Diabetes.  Sonographer:    Delcie Roch RDCS Referring Phys: [id number] PAMELA S LOVE IMPRESSIONS  1. Left ventricular ejection fraction, by estimation, is 70 to 75%. The left ventricle has hyperdynamic function. The left ventricle has no regional wall motion abnormalities. There is mild asymmetric left ventricular  hypertrophy of the basal-septal segment. Indeterminate diastolic filling due to E-A fusion.  2. Right ventricular systolic function is mildly reduced. The right ventricular size is severely enlarged.  3. The mitral valve is grossly normal. No evidence of mitral valve regurgitation. No evidence of mitral stenosis.  4. The aortic valve is tricuspid. Aortic valve regurgitation is not visualized. Aortic valve sclerosis is present, with no evidence of aortic valve stenosis.  5. The inferior vena cava is normal in size with greater than 50% respiratory variability, suggesting right atrial pressure of 3 mmHg. Comparison(s): No significant change from prior study. Prior images reviewed side by side. FINDINGS  Left Ventricle: Left ventricular ejection fraction, by estimation, is 70 to 75%. The left ventricle has hyperdynamic function. The left ventricle has no regional wall motion abnormalities. There is mild asymmetric left ventricular hypertrophy of the basal-septal segment. Indeterminate diastolic filling due to E-A fusion. Right Ventricle: The right ventricular size is severely enlarged. Right ventricular systolic function is mildly reduced. Left Atrium: Left atrial size was normal in size. Right Atrium: Right atrial size was normal in size. Pericardium: There is no evidence of pericardial effusion. Mitral Valve: The mitral valve is grossly normal. No evidence of mitral valve stenosis. Tricuspid Valve: The tricuspid valve is normal in structure. Tricuspid valve regurgitation is not demonstrated. No evidence of tricuspid stenosis. Aortic Valve: The aortic valve is tricuspid. There is moderate aortic valve annular calcification. Aortic valve regurgitation is not visualized. Aortic valve sclerosis is present, with no evidence of aortic valve stenosis. Pulmonic Valve: The pulmonic valve was normal in structure. Pulmonic valve regurgitation is not visualized. No evidence of pulmonic stenosis. Venous: The inferior vena cava is  normal in size with greater than 50% respiratory variability, suggesting right atrial pressure of 3 mmHg. IAS/Shunts: No atrial level shunt detected by color flow Doppler. Additional Comments: Spectral Doppler performed. Color Doppler performed.  LEFT VENTRICLE PLAX 2D LVIDd:         3.20 cm   Diastology LVIDs:  2.00 cm   LV e' medial:    5.00 cm/s LV PW:         1.10 cm   LV E/e' medial:  20.2 LV IVS:        1.20 cm   LV e' lateral:   5.33 cm/s LVOT diam:     2.00 cm   LV E/e' lateral: 18.9 LV SV:         92 LV SV Index:   47 LVOT Area:     3.14 cm  IVC IVC diam: 1.70 cm LEFT ATRIUM         Index LA diam:    3.80 cm 1.95 cm/m  AORTIC VALVE LVOT Vmax:   129.00 cm/s LVOT Vmean:  87.300 cm/s LVOT VTI:    0.293 m  AORTA Ao Asc diam: 3.60 cm MITRAL VALVE MV Area (PHT): 2.69 cm     SHUNTS MV Decel Time: 282 msec     Systemic VTI:  0.29 m MV E velocity: 101.00 cm/s  Systemic Diam: 2.00 cm MV A velocity: 151.00 cm/s MV E/A ratio:  0.67 Riley Lam MD Electronically signed by Riley Lam MD Signature Date/Time: 01/15/2023/2:09:07 PM    Final     Cardiac Studies     Patient Profile   JADARRIAN BRUZEK is a 82 y.o. male with a hx of HTN, remote syncope, DM  who is being seen 01/16/2023 for preop cardiac risk assessment at the request of Dr Carlis Abbott.   Assessment & Plan    1  RV dysfunction.  Pt to have repeat echo (limited) today to reevaluate RVEF   RV was dilated and function depressed on last echoes    On todays echo it also looks down, prob moderate      Etiology-  Unclear   ? Sleep apnea (central)  SHould be set up for sleep study  May need R heart cath to define pressures    This can be done as outpt  2  CAD   Pt without CP   Calcifications noted on CT (extensive)     Troponin was elevated (peak 3448) occurred when septic, on pressors He has been without CP  WOUld recomm a myoview or PET/CT to eval for ischemia   3  HTN  BP under fair control  4  HL  Will check lipids     Follow as outpt    PT will have follow up appt in [facility]  Dietrich Pates  MD   For questions or updates, please contact [facility] Please consult [email or web address] for contact info under        Signed, Dietrich Pates, MD  01/17/2023, 11:33 AM

## 2023-01-17 NOTE — Progress Notes (Signed)
PROGRESS NOTE   Subjective/Complaints: No new complaints this morning Nausea has resolved Discussed that he just went to Echo, we will follow-up with cardiology  ROS: Patient denies fever, rash, sore throat, blurred vision, dizziness, nausea, vomiting, diarrhea, cough, shortness of breath or chest pain,  headache, or mood change. Nausea resolved   Objective:   ECHOCARDIOGRAM LIMITED  Result Date: 01/15/2023    ECHOCARDIOGRAM LIMITED REPORT   Patient Name:   Larry Lane Date of Exam: 01/15/2023 Medical Rec #:  [id number]       Height:       66.0 in Accession #:    [id number]      Weight:       187.2 lb Date of Birth:  29-Oct-1940      BSA:          1.944 m Patient Age:    82 years        BP:           154/74 mmHg Patient Gender: M               HR:           79 bpm. Exam Location:  Inpatient Procedure: Limited Echo, Color Doppler and Cardiac Doppler Indications:    other abnormalities of the heart  History:        Patient has prior history of Echocardiogram examinations, most                 recent 01/05/2023. Risk Factors:Hypertension, Dyslipidemia and                 Diabetes.  Sonographer:    Delcie Roch RDCS Referring Phys: [id number] PAMELA S LOVE IMPRESSIONS  1. Left ventricular ejection fraction, by estimation, is 70 to 75%. The left ventricle has hyperdynamic function. The left ventricle has no regional wall motion abnormalities. There is mild asymmetric left ventricular hypertrophy of the basal-septal segment. Indeterminate diastolic filling due to E-A fusion.  2. Right ventricular systolic function is mildly reduced. The right ventricular size is severely enlarged.  3. The mitral valve is grossly normal. No evidence of mitral valve regurgitation. No evidence of mitral stenosis.  4. The aortic valve is tricuspid. Aortic valve regurgitation is not visualized. Aortic valve sclerosis is present, with no evidence of aortic valve stenosis.   5. The inferior vena cava is normal in size with greater than 50% respiratory variability, suggesting right atrial pressure of 3 mmHg. Comparison(s): No significant change from prior study. Prior images reviewed side by side. FINDINGS  Left Ventricle: Left ventricular ejection fraction, by estimation, is 70 to 75%. The left ventricle has hyperdynamic function. The left ventricle has no regional wall motion abnormalities. There is mild asymmetric left ventricular hypertrophy of the basal-septal segment. Indeterminate diastolic filling due to E-A fusion. Right Ventricle: The right ventricular size is severely enlarged. Right ventricular systolic function is mildly reduced. Left Atrium: Left atrial size was normal in size. Right Atrium: Right atrial size was normal in size. Pericardium: There is no evidence of pericardial effusion. Mitral Valve: The mitral valve is grossly normal. No evidence of mitral valve stenosis. Tricuspid Valve: The tricuspid valve is  normal in structure. Tricuspid valve regurgitation is not demonstrated. No evidence of tricuspid stenosis. Aortic Valve: The aortic valve is tricuspid. There is moderate aortic valve annular calcification. Aortic valve regurgitation is not visualized. Aortic valve sclerosis is present, with no evidence of aortic valve stenosis. Pulmonic Valve: The pulmonic valve was normal in structure. Pulmonic valve regurgitation is not visualized. No evidence of pulmonic stenosis. Venous: The inferior vena cava is normal in size with greater than 50% respiratory variability, suggesting right atrial pressure of 3 mmHg. IAS/Shunts: No atrial level shunt detected by color flow Doppler. Additional Comments: Spectral Doppler performed. Color Doppler performed.  LEFT VENTRICLE PLAX 2D LVIDd:         3.20 cm   Diastology LVIDs:         2.00 cm   LV e' medial:    5.00 cm/s LV PW:         1.10 cm   LV E/e' medial:  20.2 LV IVS:        1.20 cm   LV e' lateral:   5.33 cm/s LVOT diam:      2.00 cm   LV E/e' lateral: 18.9 LV SV:         92 LV SV Index:   47 LVOT Area:     3.14 cm  IVC IVC diam: 1.70 cm LEFT ATRIUM         Index LA diam:    3.80 cm 1.95 cm/m  AORTIC VALVE LVOT Vmax:   129.00 cm/s LVOT Vmean:  87.300 cm/s LVOT VTI:    0.293 m  AORTA Ao Asc diam: 3.60 cm MITRAL VALVE MV Area (PHT): 2.69 cm     SHUNTS MV Decel Time: 282 msec     Systemic VTI:  0.29 m MV E velocity: 101.00 cm/s  Systemic Diam: 2.00 cm MV A velocity: 151.00 cm/s MV E/A ratio:  0.67 Riley Lam MD Electronically signed by Riley Lam MD Signature Date/Time: 01/15/2023/2:09:07 PM    Final    Recent Labs    01/16/23 0614 01/17/23 0703  WBC 7.2 8.4  HGB 9.0* 10.4*  HCT 27.8* 31.2*  PLT 578* 731*   Recent Labs    01/16/23 0614  NA 137  K 3.6  CL 104  CO2 28  GLUCOSE 173*  BUN 9  CREATININE 0.72  CALCIUM 8.0*     Intake/Output Summary (Last 24 hours) at 01/17/2023 1015 Last data filed at 01/17/2023 0757 Gross per 24 hour  Intake 900 ml  Output 400 ml  Net 500 ml        Physical Exam: Vital Signs Blood pressure (!) 145/75, pulse 71, temperature 98 F (36.7 C), resp. rate 17, height 5\' 6"  (1.676 m), weight 84.9 kg, SpO2 96%. Constitutional: No distress . Vital signs reviewed. HEENT: NCAT, EOMI, oral membranes moist Neck: supple Cardiovascular: RRR without murmur. No JVD    Respiratory/Chest: CTA Bilaterally without wheezes or rales. Normal effort    GI/Abdomen: BS +, non-tender, non-distended Ext: no clubbing, cyanosis, or edema Psych: pleasant and cooperative  Skin: intact Neuro: Alert and oriented x3 Musculoskeletal: 5/5 except for left knee extension which is at least 3/5, impaired hip flexion 2+ to 3/5, and decreased sensation of left thigh--improving--  There is sl swelling in the left thigh. Exam stable 11/19  Assessment/Plan: 1. Functional deficits which require 3+ hours per day of interdisciplinary therapy in a comprehensive inpatient rehab  setting. Physiatrist is providing close team supervision and 24 hour management of active  medical problems listed below. Physiatrist and rehab team continue to assess barriers to discharge/monitor patient progress toward functional and medical goals  Care Tool:  Bathing    Body parts bathed by patient: Face, Right upper leg, Left upper leg, Right arm, Left arm, Chest, Abdomen, Front perineal area, Right lower leg, Left lower leg, Buttocks   Body parts bathed by helper: Right lower leg, Left lower leg, Buttocks     Bathing assist Assist Level: Supervision/Verbal cueing     Upper Body Dressing/Undressing Upper body dressing   What is the patient wearing?: Pull over shirt    Upper body assist Assist Level: Independent    Lower Body Dressing/Undressing Lower body dressing      What is the patient wearing?: Underwear/pull up, Pants     Lower body assist Assist for lower body dressing: Independent with assitive device     Toileting Toileting Toileting Activity did not occur (Clothing management and hygiene only): N/A (no void or bm)  Toileting assist Assist for toileting: Minimal Assistance - Patient > 75%     Transfers Chair/bed transfer  Transfers assist     Chair/bed transfer assist level: Supervision/Verbal cueing     Locomotion Ambulation   Ambulation assist      Assist level: Supervision/Verbal cueing Assistive device: Walker-rolling Max distance: 200'   Walk 10 feet activity   Assist     Assist level: Supervision/Verbal cueing Assistive device: Walker-rolling   Walk 50 feet activity   Assist    Assist level: Supervision/Verbal cueing Assistive device: Walker-rolling    Walk 150 feet activity   Assist Walk 150 feet activity did not occur: Safety/medical concerns  Assist level: Supervision/Verbal cueing Assistive device: Walker-rolling    Walk 10 feet on uneven surface  activity   Assist Walk 10 feet on uneven surfaces activity  did not occur: Safety/medical concerns   Assist level: Contact Guard/Touching assist Assistive device: Walker-rolling   Wheelchair     Assist Is the patient using a wheelchair?: No Type of Wheelchair: Manual    Wheelchair assist level: Dependent - Patient 0%      Wheelchair 50 feet with 2 turns activity    Assist        Assist Level: Dependent - Patient 0%   Wheelchair 150 feet activity     Assist      Assist Level: Dependent - Patient 0%   Blood pressure (!) 145/75, pulse 71, temperature 98 F (36.7 C), resp. rate 17, height 5\' 6"  (1.676 m), weight 84.9 kg, SpO2 96%.  Medical Problem List and Plan: 1. Functional deficits secondary to debility 2/2 sepsis             -patient may shower             -ELOS/Goals: 8 days mod I   D/c home today 2.  Antithrombotics: -DVT/anticoagulation:  Pharmaceutical: Coumadin on hold (INR still 1.5)             -antiplatelet therapy: none   3. Pain Management: Tylenol, oxycodone 5 mg q 4 hrs prn as needed             -gabapentin 600 mg TID   4. Mood/Behavior/Sleep: LCSW to evaluate and provide emotional support             -trazodone 100 mg q HS prn             -antipsychotic agents: n/a   5. Neuropsych/cognition: This patient is not capable of making decisions  on his own behalf.   6. Skin/Wound Care: Routine skin care checks   7. Fluids/Electrolytes/Nutrition: Routine Is and Os and follow-up chemistries             -carb modified diet   8: Hypertension: monitor TID and prn. Restart losartan 25mg  daily. 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; A1c = 7.6% -increase semglee to 6U  -continue SSI -continue Novolog 3 units TID -acarbose added TID   CBG (last 3)  Recent Labs    01/16/23 1658 01/16/23 2058 01/17/23 0612  GLUCAP 235* 195*  168*    -11/17 reasonable control until yesterday--obsv for pattern today 11: Trigeminal neuralgia:  -continue Tegretol XR 200 mg QID -continue gabapentin 600 mg TID  -discussed his history of treatment   12: Polypharmacy: coumadin d/ced as no evidence of PE on recent imaging    13: GERD: continue PPI   14: Anemia: stool occult positive, repeat also resulted positive,   11/17 today's Hgb up to stable at 8.9, no gross blood in stool. Pt appears comfortable   -CTA negative   -limited ECHO is still pending. At this point it might not be done until Monday. If negative will permanently stop warfarin  Consulted cardiology for clearance for potential colonoscopy tomorrow  Discussed Hgb improved to 10.4!  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 MRI left femur ordered and consistent with denervation  -I reviewed findings with pt and wife today  -he already has experienced improvement in the femoral nerve motor/sensory fxn  -consider EMG/NCS as outpt depending upon how he's doing  -may try e-stim on thigh in therapy  -discussed improvements favor compression rather than transection and there is a good prognosis for his recovery  -discussed HEP program at home  16. CAD: Echo performed today, will f/u with cars regarding results   >30 minutes spent in discharge of patient including review of medications and follow-up appointments, physical examination, and in answering all patient's questions         LOS: 8 days A FACE TO FACE EVALUATION WAS PERFORMED  Nekesha Font P Steve Gregg 01/17/2023, 10:15 AM

## 2023-01-17 NOTE — Progress Notes (Signed)
Inpatient Rehabilitation Discharge Medication Review by a Pharmacist  A complete drug regimen review was completed for this patient to identify any potential clinically significant medication issues.  High Risk Drug Classes Is patient taking? Indication by Medication  Antipsychotic No   Anticoagulant No   Antibiotic No   Opioid No   Antiplatelet No   Hypoglycemics/insulin No   Vasoactive Medication Yes Amlodipine, losartan - hypertension  Chemotherapy No   Other Yes Carbamazepine ER, Gabapentin - trigeminal neuralgia Omeprazole - GERD Rosuvastatin, cholestyramine -hyperlipidemia Multivitamin - supplement Apoaequorin - memory supplement  PRNs: Aceteminophen - mild pain     Type of Medication Issue Identified Description of Issue Recommendation(s)  Drug Interaction(s) (clinically significant)     Duplicate Therapy     Allergy     No Medication Administration End Date     Incorrect Dose     Additional Drug Therapy Needed     Significant med changes from prior encounter (inform family/care partners about these prior to discharge).  Communicate changes with patient/family prior to discharge.  Other  Warfarin discontinued on 11/15 with FOB+ stools and repeat CTA negative for PE.  Antibiotics completed 01/10/23. Pantoprazole while inpatient.     Back to Omeprazole at discharge.    Clinically significant medication issues were identified that warrant physician communication and completion of prescribed/recommended actions by midnight of the next day:  Yes  Name of provider notified for urgent issues identified: Wendi Maya, PA-C  Provider Method of Notification: secure chat  Pharmacist comments:  - Has been on SSI and Acarbose, but no diabetes medication at discharge. - Per Kela Millin, PA-C, uses VGO 20 at home and will resume. No acarbose for now. Patient to discuss with PCP.   Time spent performing this drug regimen review (minutes):  [address]   Dennie Fetters, [place] 01/17/2023 12:30 PM

## 2023-01-18 ENCOUNTER — Telehealth: Payer: Self-pay

## 2023-01-18 NOTE — Telephone Encounter (Signed)
Spoke with the spouse and the patient. Appointment arranged 03/09/23 at 10:30 am. Appointment card mailed.

## 2023-01-18 NOTE — Telephone Encounter (Signed)
-----   Message from Mike Gip sent at 01/17/2023 12:35 PM EST ----- Regarding: office appt Beth, this patient had been seen over the weekend by Dr. Tomasa Rand in consult  He is having further cardiac workup, and will need to have an outpatient appointment with Dr. Tomasa Rand to discuss a colonoscopy Okay at this point if this visit is 6 weeks out. Please arrange and contact patient within the next several days thank you

## 2023-01-25 LAB — CULTURE, FUNGUS WITHOUT SMEAR

## 2023-01-29 ENCOUNTER — Observation Stay (HOSPITAL_BASED_OUTPATIENT_CLINIC_OR_DEPARTMENT_OTHER): Payer: Medicare Other

## 2023-01-29 ENCOUNTER — Observation Stay (HOSPITAL_COMMUNITY)
Admission: EM | Admit: 2023-01-29 | Discharge: 2023-01-30 | Disposition: A | Discharge status: Home Health | Payer: Medicare Other | Attending: Internal Medicine | Admitting: Internal Medicine

## 2023-01-29 ENCOUNTER — Other Ambulatory Visit: Payer: Self-pay

## 2023-01-29 ENCOUNTER — Emergency Department (HOSPITAL_COMMUNITY): Payer: Medicare Other

## 2023-01-29 ENCOUNTER — Encounter (HOSPITAL_COMMUNITY): Payer: Self-pay | Admitting: Student

## 2023-01-29 DIAGNOSIS — I10 Essential (primary) hypertension: Secondary | ICD-10-CM | POA: Insufficient documentation

## 2023-01-29 DIAGNOSIS — Z7982 Long term (current) use of aspirin: Secondary | ICD-10-CM | POA: Insufficient documentation

## 2023-01-29 DIAGNOSIS — R7401 Elevation of levels of liver transaminase levels: Secondary | ICD-10-CM | POA: Diagnosis not present

## 2023-01-29 DIAGNOSIS — G500 Trigeminal neuralgia: Secondary | ICD-10-CM | POA: Diagnosis not present

## 2023-01-29 DIAGNOSIS — Z794 Long term (current) use of insulin: Secondary | ICD-10-CM | POA: Insufficient documentation

## 2023-01-29 DIAGNOSIS — Z85828 Personal history of other malignant neoplasm of skin: Secondary | ICD-10-CM | POA: Diagnosis not present

## 2023-01-29 DIAGNOSIS — R7989 Other specified abnormal findings of blood chemistry: Secondary | ICD-10-CM

## 2023-01-29 DIAGNOSIS — E871 Hypo-osmolality and hyponatremia: Secondary | ICD-10-CM | POA: Insufficient documentation

## 2023-01-29 DIAGNOSIS — E872 Acidosis, unspecified: Secondary | ICD-10-CM | POA: Diagnosis not present

## 2023-01-29 DIAGNOSIS — R55 Syncope and collapse: Secondary | ICD-10-CM | POA: Diagnosis not present

## 2023-01-29 DIAGNOSIS — M6281 Muscle weakness (generalized): Secondary | ICD-10-CM | POA: Insufficient documentation

## 2023-01-29 DIAGNOSIS — Z79899 Other long term (current) drug therapy: Secondary | ICD-10-CM | POA: Diagnosis not present

## 2023-01-29 DIAGNOSIS — R262 Difficulty in walking, not elsewhere classified: Secondary | ICD-10-CM | POA: Diagnosis not present

## 2023-01-29 DIAGNOSIS — E119 Type 2 diabetes mellitus without complications: Secondary | ICD-10-CM | POA: Insufficient documentation

## 2023-01-29 DIAGNOSIS — R9431 Abnormal electrocardiogram [ECG] [EKG]: Secondary | ICD-10-CM | POA: Diagnosis not present

## 2023-01-29 DIAGNOSIS — K219 Gastro-esophageal reflux disease without esophagitis: Secondary | ICD-10-CM | POA: Diagnosis not present

## 2023-01-29 DIAGNOSIS — R0609 Other forms of dyspnea: Secondary | ICD-10-CM

## 2023-01-29 LAB — URINALYSIS, ROUTINE W REFLEX MICROSCOPIC
Bacteria, UA: NONE SEEN
Bilirubin Urine: NEGATIVE
Glucose, UA: >=500 mg/dL — AB
Hgb urine dipstick: NEGATIVE
Ketones, ur: NEGATIVE mg/dL
Leukocytes,Ua: NEGATIVE
Nitrite: NEGATIVE
Protein, ur: 30 mg/dL — AB
Specific Gravity, Urine: 1.018 (ref 1.005–1.030)
pH: 6 (ref 5.0–8.0)

## 2023-01-29 LAB — BASIC METABOLIC PANEL
Anion gap: 10 (ref 5–15)
BUN: 12 mg/dL (ref 8–23)
CO2: 23 mmol/L (ref 22–32)
Calcium: 8.5 mg/dL — ABNORMAL LOW (ref 8.9–10.3)
Chloride: 96 mmol/L — ABNORMAL LOW (ref 98–111)
Creatinine, Ser: 0.88 mg/dL (ref 0.61–1.24)
GFR, Estimated: >60 mL/min (ref >60)
Glucose, Bld: 306 mg/dL — ABNORMAL HIGH (ref 70–99)
Potassium: 4.7 mmol/L (ref 3.5–5.1)
Sodium: 129 mmol/L — ABNORMAL LOW (ref 135–145)

## 2023-01-29 LAB — ECHOCARDIOGRAM LIMITED
Area-P 1/2: 2.37 cm2
Height: 66 in
S' Lateral: 2.5 cm
Weight: 2736 [oz_av]

## 2023-01-29 LAB — TSH: TSH: 1.426 u[IU]/mL (ref 0.350–4.500)

## 2023-01-29 LAB — CBC
HCT: 34.7 % — ABNORMAL LOW (ref 39.0–52.0)
Hemoglobin: 11.2 g/dL — ABNORMAL LOW (ref 13.0–17.0)
MCH: 30.6 pg (ref 26.0–34.0)
MCHC: 32.3 g/dL (ref 30.0–36.0)
MCV: 94.8 fL (ref 80.0–100.0)
Platelets: 334 10*3/uL (ref 150–400)
RBC: 3.66 MIL/uL — ABNORMAL LOW (ref 4.22–5.81)
RDW: 12.3 % (ref 11.5–15.5)
WBC: 9.9 10*3/uL (ref 4.0–10.5)
nRBC: 0 % (ref 0.0–0.2)

## 2023-01-29 LAB — TROPONIN I (HIGH SENSITIVITY)
Troponin I (High Sensitivity): 6 ng/L (ref <18)
Troponin I (High Sensitivity): 6 ng/L (ref <18)

## 2023-01-29 LAB — I-STAT CG4 LACTIC ACID, ED
Lactic Acid, Venous: 4.2 mmol/L (ref 0.5–1.9)
Lactic Acid, Venous: 3.6 mmol/L (ref 0.5–1.9)

## 2023-01-29 LAB — GLUCOSE, CAPILLARY
Glucose-Capillary: 128 mg/dL — ABNORMAL HIGH (ref 70–99)
Glucose-Capillary: 108 mg/dL — ABNORMAL HIGH (ref 70–99)

## 2023-01-29 LAB — CBG MONITORING, ED: Glucose-Capillary: 242 mg/dL — ABNORMAL HIGH (ref 70–99)

## 2023-01-29 LAB — SODIUM, URINE, RANDOM: Sodium, Ur: 53 mmol/L

## 2023-01-29 LAB — OSMOLALITY, URINE: Osmolality, Ur: 206 mosm/kg — ABNORMAL LOW (ref 300–900)

## 2023-01-29 MED ORDER — ENOXAPARIN SODIUM 40 MG/0.4ML IJ SOSY: 40.0000 mg | PREFILLED_SYRINGE | INTRAMUSCULAR | Status: DC

## 2023-01-29 MED ORDER — ROSUVASTATIN CALCIUM 5 MG PO TABS
10.0000 mg | ORAL_TABLET | Freq: Every day | ORAL | Status: DC
  Administered 2023-01-29: 10 mg via ORAL
  Filled 2023-01-29: qty 2

## 2023-01-29 MED ORDER — LOSARTAN POTASSIUM 25 MG PO TABS
25.0000 mg | ORAL_TABLET | Freq: Every day | ORAL | Status: DC
  Administered 2023-01-29 – 2023-01-30 (×2): 25 mg via ORAL
  Filled 2023-01-29 (×2): qty 1

## 2023-01-29 MED ORDER — ASPIRIN 81 MG PO CHEW
324.0000 mg | CHEWABLE_TABLET | Freq: Once | ORAL | Status: AC
  Administered 2023-01-29: 324 mg via ORAL
  Filled 2023-01-29: qty 4

## 2023-01-29 MED ORDER — SENNOSIDES-DOCUSATE SODIUM 8.6-50 MG PO TABS
1.0000 | ORAL_TABLET | Freq: Every evening | ORAL | Status: DC | PRN
Start: 2023-01-29 — End: 2023-01-30

## 2023-01-29 MED ORDER — AMLODIPINE BESYLATE 5 MG PO TABS
5.0000 mg | ORAL_TABLET | Freq: Every day | ORAL | Status: DC
  Administered 2023-01-29 – 2023-01-30 (×2): 5 mg via ORAL
  Filled 2023-01-29 (×2): qty 1

## 2023-01-29 MED ORDER — ONDANSETRON HCL 4 MG/2ML IJ SOLN: 4.0000 mg | Freq: Four times a day (QID) | INTRAMUSCULAR | Status: DC | PRN

## 2023-01-29 MED ORDER — APOAEQUORIN 10 MG PO CAPS: 1.0000 | ORAL_CAPSULE | Freq: Every day | ORAL | Status: DC

## 2023-01-29 MED ORDER — CARBAMAZEPINE ER 200 MG PO TB12
200.0000 mg | ORAL_TABLET | Freq: Four times a day (QID) | ORAL | Status: DC
  Administered 2023-01-29 – 2023-01-30 (×4): 200 mg via ORAL
  Filled 2023-01-29 (×6): qty 1

## 2023-01-29 MED ORDER — CHOLESTYRAMINE LIGHT 4 G PO PACK
4.0000 g | PACK | Freq: Two times a day (BID) | ORAL | Status: DC
Start: 2023-01-29 — End: 2023-01-30
  Administered 2023-01-29 – 2023-01-30 (×2): 4 g via ORAL
  Filled 2023-01-29 (×3): qty 1

## 2023-01-29 MED ORDER — SODIUM CHLORIDE 0.9 % IV BOLUS
1000.0000 mL | Freq: Once | INTRAVENOUS | Status: AC
  Administered 2023-01-29: 1000 mL via INTRAVENOUS

## 2023-01-29 MED ORDER — INSULIN ASPART 100 UNIT/ML IJ SOLN: 0.0000 [IU] | Freq: Every day | INTRAMUSCULAR | Status: DC

## 2023-01-29 MED ORDER — INSULIN ASPART 100 UNIT/ML IJ SOLN
0.0000 [IU] | Freq: Three times a day (TID) | INTRAMUSCULAR | Status: DC
Start: 2023-01-29 — End: 2023-01-30
  Administered 2023-01-29: 2 [IU] via SUBCUTANEOUS
  Administered 2023-01-30: 5 [IU] via SUBCUTANEOUS
  Administered 2023-01-30: 2 [IU] via SUBCUTANEOUS

## 2023-01-29 MED ORDER — ACETAMINOPHEN 650 MG RE SUPP: 650.0000 mg | Freq: Four times a day (QID) | RECTAL | Status: DC | PRN

## 2023-01-29 MED ORDER — ONDANSETRON HCL 4 MG PO TABS: 4.0000 mg | ORAL_TABLET | Freq: Four times a day (QID) | ORAL | Status: DC | PRN

## 2023-01-29 MED ORDER — ACETAMINOPHEN 325 MG PO TABS: 650.0000 mg | ORAL_TABLET | Freq: Four times a day (QID) | ORAL | Status: DC | PRN

## 2023-01-29 MED ORDER — GABAPENTIN 300 MG PO CAPS
600.0000 mg | ORAL_CAPSULE | Freq: Three times a day (TID) | ORAL | Status: DC
  Administered 2023-01-29 – 2023-01-30 (×3): 600 mg via ORAL
  Filled 2023-01-29 (×3): qty 2

## 2023-01-29 MED ORDER — ADULT MULTIVITAMIN W/MINERALS CH
1.0000 | ORAL_TABLET | Freq: Every day | ORAL | Status: DC
  Administered 2023-01-29 – 2023-01-30 (×2): 1 via ORAL
  Filled 2023-01-29 (×2): qty 1

## 2023-01-29 MED ORDER — LACTATED RINGERS IV BOLUS
1000.0000 mL | Freq: Once | INTRAVENOUS | Status: AC
  Administered 2023-01-29: 1000 mL via INTRAVENOUS

## 2023-01-29 MED ORDER — CARBAMAZEPINE ER 100 MG PO CP12
200.0000 mg | ORAL_CAPSULE | Freq: Four times a day (QID) | ORAL | Status: DC
  Filled 2023-01-29 (×3): qty 2

## 2023-01-29 MED ORDER — PANTOPRAZOLE SODIUM 40 MG PO TBEC
40.0000 mg | DELAYED_RELEASE_TABLET | Freq: Every day | ORAL | Status: DC
  Administered 2023-01-29 – 2023-01-30 (×2): 40 mg via ORAL
  Filled 2023-01-29 (×2): qty 1

## 2023-01-29 NOTE — ED Notes (Signed)
Cardiologist at bedside.  

## 2023-01-29 NOTE — ED Notes (Signed)
Hospitalist at bedside 

## 2023-01-29 NOTE — Progress Notes (Signed)
New Admission Note:   Arrival Method: stretcher Mental Orientation: aa+ox4 Telemetry: box 4 Assessment: Completed Skin: c/d/i IV: left arm Pain: denies Tubes: N/A Safety Measures: Safety Fall Prevention Plan has been given, discussed and signed Admission: Completed [facility] Orientation: Patient has been orientated to the room, unit and staff.  Family: wife present  Orders have been reviewed and implemented. Will continue to monitor the patient. Call light has been placed within reach and bed alarm has been activated.   Margarita Grizzle, RN

## 2023-01-29 NOTE — Consult Note (Signed)
Cardiology Consultation:  Patient ID: Larry Lane MRN: [id number]; DOB: 20-Jun-1940  Admit date: 01/29/2023 Date of Consult: 01/29/2023  Primary Care Provider: Jerl Mina, MD Primary Cardiologist: None  Primary Electrophysiologist:  None   Patient Profile:  Larry Lane is a 82 y.o. male with a hx of hypertension, diabetes, trigeminal neuralgia, recent admission for sepsis and submassive PE with cor pulmonale who is being seen today for the evaluation of presyncope/abnormal EKG at the request of Sharrell Ku, MD.  History of Present Illness:  Larry Lane presents with presyncope.  The patient reports that this morning he got up and had very little to eat or drink.  Apparently he went to the kitchen and became dizzy and lightheaded.  He reports sweating.  His wife reports she thought his blood sugar was low and gave him juice to drink.  This did not improve his symptoms.  He sat down Going in and out of awareness.  She reports he did not fully lose consciousness but he looks like he was going to pass out.  EMS was called and he was brought to the ER. Initial EKG with concern for ST elevation but on my review shows J-point elevation.  Repeat EKG improved.  Troponins negative.  Recent admission to the hospital for sepsis secondary to ileitis.  Had a complicated course with submassive pulmonary embolism and cor pulmonale.  Had elevated troponins at admission but plan was for outpatient cardiac stress test.  They report he is not eating and drinking well.  He does left cardiac rehab.  He was recently diagnosed with submassive pulmonary embolism but apparently has been taken off anticoagulation.  He has not completed 3 months of AC.  CV exam normal.  EKG unremarkable.  Blood glucose values have been running high.  He reports going to the bathroom often.  He is not drinking enough water.  Symptoms likely are vasovagal in nature.  Vital signs have been stable.  Chest x-ray negative.  Lactic acid  elevated.  Past Medical History: Past Medical History:  Diagnosis Date   Abscess of back    Arthritis    Bacterial infection 2015   Cancer (HCC)    skin    Diabetes mellitus without complication (HCC)    Dysrhythmia    Edema    RIGHT ANKLE   GERD (gastroesophageal reflux disease)    Hypertension    Trigeminal nerve disorder    Trigeminal neuralgia     Past Surgical History: Past Surgical History:  Procedure Laterality Date   APPENDECTOMY  2010   CATARACT EXTRACTION W/PHACO Right 01/26/2017   Procedure: CATARACT EXTRACTION PHACO AND INTRAOCULAR LENS PLACEMENT (IOC);  Surgeon: Nevada Crane, MD;  Location: [facility] ORS;  Service: Ophthalmology;  Laterality: Right;  Lot # [id number] H [place]: 00:48.2 AP%: 9.9 CDE: 5.35   I&D of back abscess     TRIGEMINAL NERVE DECOMPRESSION       Allergies:    No Known Allergies  Social History:   Social History   Socioeconomic History   Marital status: Married    Spouse name: Not on file   Number of children: Not on file   Years of education: Not on file   Highest education level: Not on file  Occupational History   Not on file  Tobacco Use   Smoking status: Never   Smokeless tobacco: Never  Vaping Use   Vaping status: Never Used  Substance and Sexual Activity   Alcohol use: No    Alcohol/week:  0.0 standard drinks of alcohol   Drug use: No   Sexual activity: Not on file  Other Topics Concern   Not on file  Social History Narrative   Not on file   Social Determinants of Health   Financial Resource Strain: Patient Declined (11/16/2022)   Received from [facility]   Overall Financial Resource Strain (CARDIA)    Difficulty of Paying Living Expenses: Patient declined  Food Insecurity: No Food Insecurity (01/09/2023)   Hunger Vital Sign    Worried About Running Out of Food in the Last Year: Never true    Ran Out of Food in the Last Year: Never true  Transportation Needs: No Transportation Needs (01/09/2023)    PRAPARE - Administrator, Civil Service (Medical): No    Lack of Transportation (Non-Medical): No  Physical Activity: Not on file  Stress: Not on file  Social Connections: Not on file  Intimate Partner Violence: Not At Risk (01/09/2023)   Humiliation, Afraid, Rape, and Kick questionnaire    Fear of Current or Ex-Partner: No    Emotionally Abused: No    Physically Abused: No    Sexually Abused: No     Family History:    Family History  Problem Relation Age of Onset   Heart disease Father      ROS:  All other ROS reviewed and negative. Pertinent positives noted in the HPI.     Physical Exam/Data:   Vitals:   01/29/23 1000 01/29/23 1005 01/29/23 1007 01/29/23 1008  BP:  133/63 134/69 138/68  Pulse:  64 73 75  Resp:  20 16 19   Temp:      TempSrc:      SpO2: 100% 97% 100% 100%  Weight: 84.9 kg     Height: 5\' 6"  (1.676 m)       Intake/Output Summary (Last 24 hours) at 01/29/2023 1311 Last data filed at 01/29/2023 1259 Gross per 24 hour  Intake 992.2 ml  Output --  Net 992.2 ml       01/29/2023   10:00 AM 01/13/2023   12:00 PM 01/08/2023    5:25 AM  Last 3 Weights  Weight (lbs) 187 lb 2.7 oz 187 lb 2.7 oz 187 lb 2.7 oz  Weight (kg) 84.9 kg 84.9 kg 84.9 kg    Body mass index is 30.21 kg/m.  General: Well nourished, well developed, in no acute distress Head: Atraumatic, normal size  Eyes: PEERLA, EOMI  Neck: Supple, no JVD Endocrine: No thryomegaly Cardiac: Normal S1, S2; RRR; no murmurs, rubs, or gallops Lungs: Clear to auscultation bilaterally, no wheezing, rhonchi or rales  Abd: Soft, nontender, no hepatomegaly  Ext: No edema, pulses 2+ Musculoskeletal: No deformities, BUE and BLE strength normal and equal Skin: Warm and dry, no rashes   Neuro: Alert and oriented to person, place, time, and situation, CNII-XII grossly intact, no focal deficits  Psych: Normal mood and affect   EKG:  The EKG was personally reviewed and demonstrates: Normal sinus  rhythm heart rate 64, no acute ischemic changes Telemetry:  Telemetry was personally reviewed and demonstrates:  SR 60s  Relevant CV Studies: TTE 01/17/2023  1. Left ventricular ejection fraction, by estimation, is 60 to 65%. The  left ventricle has normal function. The left ventricle has no regional  wall motion abnormalities.   2. Right ventricular systolic function is moderately reduced primarily in  the mid and basal segments. The right ventricular size is moderately to  severely enlarged.  3. The mitral valve is degenerative. No evidence of mitral valve  regurgitation. No evidence of mitral stenosis.   4. The aortic valve is normal in structure. Aortic valve regurgitation is  not visualized. No aortic stenosis is present.   Assessment and Plan:   # Presyncope # Lactic acidosis # Dehydration # Recent submassive pulmonary embolism with RV dysfunction -Presents with symptoms of presyncope.  Sound vasovagal in nature.  He reports he has not been eating or drinking well since leaving inpatient rehab.  his wife reports his blood pressure was very low this morning. -He also has known RV dysfunction in the setting of recent submassive pulmonary embolism.   -Recommend admission to the hospital medicine service for further workup. -We will plan to repeat his echo. -Troponins are negative x 2.  EKG shows no acute ischemic changes.  There was minimal J-point elevation inferior leads but this could be related to RV dysfunction.  Will repeat his echo.  Low suspicion for an acute coronary syndrome. -He also should be on anticoagulation.  He reports he was taken off of this.  He needs to complete 3 to 6 months of therapy.  Apparently inpatient rehab took him off this.  Would recommend to restart Coumadin as you are able.      For questions or updates, please contact [facility] Please consult [email or web address] for contact info under     Signed, Gerri Spore T. Flora Lipps, MD, [facility] [place]   [facility]  01/29/2023 1:11 PM

## 2023-01-29 NOTE — H&P (Signed)
History and Physical    Patient: Larry Lane UJW:[id number] DOB: 15-Nov-1940 DOA: 01/29/2023 DOS: the patient was seen and examined on 01/29/2023 PCP: Jerl Mina, MD  Patient coming from: Home  Chief Complaint:  Chief Complaint  Patient presents with   Near Syncope   HPI: Larry Lane is a 82 y.o. male with medical history significant of T2DM, HTN, HLD, trigeminal neuralgia, GERD and arthritis who presented to the ED for evaluation of presyncope.  Patient reports having 2 falls last week when his legs gave out. His spouse tells me that this morning, when patient woke up he looked pale and sweaty. He walked to the kitchen and became lightheaded. Spouse checked his BP that was initially high but then low on repeat. His blood sugar was 92 which is low for patient. Spouse gave him some juice but it did not improve his symptoms. Patient continued to few woozy but did not lose consciousness have a syncope episode.  States he feels best and his blood sugar is above the 150s. Per spouse, patient blood sugars are usually a little low at night.  Patient reports having some chest pain about 2 weeks ago but denies any chest pain today.  He denies any headaches, shortness of breath, abdominal pain, leg swelling, bloody stools or hematuria.  Of note, patient was recently hospitalized on 11/6 for septic shock and concern for PE. Patient was started on Eliquis but ultimately this was continued at rehab after repeat CTA chest was negative for PE.  ED course: Initial vitals temp 98.5, RR 16, HR 64, BP 1.5 x 67, 100% SpO2 on room air.  Negative orthostatic vitals. Labs showed WBC 9.9, Hgb 11.2, platelet 334, sodium 129, K+ 4.7, bicarb 23, glucose 306, creatinine 0.88, troponin 6->6, UA showed significant glucosuria but no signs of infection, lactic acid 4.2->3.6, TSH 1.43 CXR showed mild bibasilar atelectasis and no active disease Initial EKG with some ST elevations in the inferior leads, code STEMI was  called and patient received aspirin 324 mg however on cardiology review of EKG, this was thought to be J-point elevation.  Code STEMI called off.  Repeat EKG shows normal sinus rhythm and some improvement in the J-point elevations. TRH consulted for admission with cardiology as consult  Review of Systems: As mentioned in the history of present illness. All other systems reviewed and are negative. Past Medical History:  Diagnosis Date   Abscess of back    Arthritis    Bacterial infection 2015   Cancer (HCC)    skin    Diabetes mellitus without complication (HCC)    Dysrhythmia    Edema    RIGHT ANKLE   GERD (gastroesophageal reflux disease)    Hypertension    Trigeminal nerve disorder    Trigeminal neuralgia    Past Surgical History:  Procedure Laterality Date   APPENDECTOMY  2010   CATARACT EXTRACTION W/PHACO Right 01/26/2017   Procedure: CATARACT EXTRACTION PHACO AND INTRAOCULAR LENS PLACEMENT (IOC);  Surgeon: Nevada Crane, MD;  Location: [facility] ORS;  Service: Ophthalmology;  Laterality: Right;  Lot # [id number] H [place]: 00:48.2 AP%: 9.9 CDE: 5.35   I&D of back abscess     TRIGEMINAL NERVE DECOMPRESSION     Social History:  reports that he has never smoked. He has never used smokeless tobacco. He reports that he does not drink alcohol and does not use drugs.  No Known Allergies  Family History  Problem Relation Age of Onset   Heart disease Father  Prior to Admission medications   Medication Sig Start Date End Date Taking? Authorizing Provider  acetaminophen (TYLENOL) 325 MG tablet Take 1-2 tablets (325-650 mg total) by mouth every 4 (four) hours as needed for mild pain (pain score 1-3). 01/17/23  Yes Setzer, Lynnell Jude, PA-C  amLODipine (NORVASC) 5 MG tablet Take 1 tablet by mouth daily. 05/23/22  Yes [provider]  Apoaequorin 10 MG CAPS Take 1 capsule by mouth daily. Prevagen   Yes [provider]  carbamazepine (CARBATROL) 200 MG 12 hr capsule Take  200 mg by mouth See admin instructions. Take 200mg  by mouth every 6 hours.   Yes [provider]  cholestyramine light (PREVALITE) 4 GM/DOSE powder Take 4 g by mouth 2 (two) times daily with a meal. 12/01/22  Yes [provider]  gabapentin (NEURONTIN) 600 MG tablet Take 600 mg by mouth See admin instructions. Take 600mg  by mouth every 6 hours. 12/19/22  Yes [provider]  insulin lispro (HUMALOG) 100 UNIT/ML cartridge 20-30 Units. Per pump.   Yes [provider]  losartan (COZAAR) 25 MG tablet Take 1 tablet (25 mg total) by mouth daily. 01/18/23  Yes Setzer, Lynnell Jude, PA-C  Multiple Vitamin (MULTI-VITAMIN) tablet Take 1 tablet by mouth daily.   Yes [provider]  omeprazole (PRILOSEC) 20 MG capsule Take 20 mg by mouth daily.   Yes [provider]  rosuvastatin (CRESTOR) 10 MG tablet Take 1 tablet by mouth daily. 11/24/22  Yes [provider]  aspirin EC 81 MG tablet Take 81 mg by mouth daily. Swallow whole. Patient not taking: Reported on 01/29/2023    [provider]    Physical Exam: Vitals:   01/29/23 1330 01/29/23 1400 01/29/23 1450 01/29/23 1451  BP: (!) 156/117 (!) 141/72  (!) 152/70  Pulse: 69 64  65  Resp:    18  Temp:    98 F (36.7 C)  TempSrc:    Oral  SpO2: 100% 100%  100%  Weight:   77.6 kg   Height:   5\' 6"  (1.676 m)    General: Pleasant, well-appearing elderly man laying in bed. No acute distress. HEENT: [place]/AT. Anicteric sclera.  Dry mucous membrane CV: RRR. No murmurs, rubs, or gallops. No LE edema Pulmonary: Lungs CTAB. Normal effort. No wheezing or rales. Abdominal: Soft, nontender, nondistended. Normal bowel sounds. Extremities: Palpable radial and DP pulses. LUE edema due to IV infiltration Skin: Warm and dry. No obvious rash or lesions. Neuro: A&Ox3. Moves all extremities. Normal sensation to light touch. No focal deficit. Psych: Normal mood and affect  Data Reviewed:  Labs showed WBC  9.9, Hgb 11.2, platelet 334, sodium 129, K+ 4.7, bicarb 23, glucose 306, creatinine 0.88, troponin 6->6, UA showed significant glucosuria but no signs of infection, lactic acid 4.2->3.6, TSH 1.43 CXR showed mild bibasilar atelectasis and no active disease EKG with normal sinus rhythm and J-point elevations in the inferior leads.  Assessment and Plan: ABDULAI MAPSON is a 82 y.o. male with medical history significant of T2DM, HTN, HLD, trigeminal neuralgia, GERD and arthritis who presented to the ED for evaluation of presyncope and admitted for close observation.  # Presyncope Diabetic patient presented for evaluation of near syncope this morning at home. Patient found to have low BP on EMS arrival but this recovered spontaneously. Initial concern for possible ACS but this has been ruled out by cardiology. Orthostatic vitals are negative. Low suspicion for arrhythmia. Vasovagal episode is possible. Hypoglycemic episode also possible  as patient reports history of passing out with low blood sugars in the past.  Patient also has acute hyponatremia that is likely contributing to his dizziness. S/p 1 L IV LR bolus in the ED. -Admit to telemetry -Follow-up TTE -Additional 1 L IVNS bolus -Fall precautions -PT/OT  # Hyponatremia Patient found to have sodium of 129 on admission down from 137 13 days ago. Urine osmole elevated to 206, and urine sodium elevated to 53. TSH normal. 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  -Follow-up morning BMP -Consider fluid restriction if no improvement  # T2DM Last A1c 7.6% 3 weeks ago.  Per spouse, patient has blood sugars usually stays high above the 150s. He has a CGM as well as an insulin pump.  CBG 242->128.  -SSI with meals, CBG monitoring  # Trigeminal neuralgia -Carbamazepine 200 mg every 6 hours -Gabapentin 600 mg every 8 hours  # HTN -Amlodipine 5 mg daily -Losartan 25 mg daily  # HLD -Rosuvastatin 10 mg daily  #  GERD -Protonix 40 mg daily   Advance Care Planning:   Code Status: Full Code   Consults: Cardiology  Family Communication: Discussed admission with spouse at bedside  Severity of Illness: The appropriate patient status for this patient is OBSERVATION. Observation status is judged to be reasonable and necessary in order to provide the required intensity of service to ensure the patient's safety. The patient's presenting symptoms, physical exam findings, and initial radiographic and laboratory data in the context of their medical condition is felt to place them at decreased risk for further clinical deterioration. Furthermore, it is anticipated that the patient will be medically stable for discharge from the hospital within 2 midnights of admission.   Author: Steffanie Rainwater, MD 01/29/2023 5:08 PM  For on call review [email or web address].

## 2023-01-29 NOTE — ED Notes (Signed)
X-ray at bedside

## 2023-01-29 NOTE — Progress Notes (Signed)
  Echocardiogram 2D Echocardiogram has been performed.  Delcie Roch 01/29/2023, 4:31 PM

## 2023-01-29 NOTE — ED Notes (Signed)
Patient placed on zoll pads per request.

## 2023-01-29 NOTE — ED Notes (Signed)
PT well appearing upon transport. 

## 2023-01-29 NOTE — Plan of Care (Signed)

## 2023-01-29 NOTE — ED Provider Notes (Signed)
[place] EMERGENCY DEPARTMENT AT [facility] Provider Note   CSN: [id number] Arrival date & time: 01/29/23  7829     History  Chief Complaint  Patient presents with   Near Syncope    CAANAN VOIT is a 82 y.o. male.  HPI 82 year old male presents today via EMS with report of near syncope.  Patient states that he has had this happen in the past.  He states that today he was walking into his kitchen and felt lightheaded.  He sat down at the kitchen table continued to feel intermittently lightheaded and laid his head down on the table.  He is not sure whether or not he completely lost consciousness.  EMS reports that he had near syncope.  Patient states he was in the hospital recently for this.  Review of last admission shows sepsis, it does not appear that any definite acute source was found but patient was treated with multiple antimicrobials and acyclovir.  He did require pressor support and had acute left kidney injury, elevated troponin, RV dysfunction, CSF did not grow anything at 2 days, in acute care was transition to DOAC with concerns of PE, this was stopped in rehab after repeat CTA     Home Medications Prior to Admission medications   Medication Sig Start Date End Date Taking? Authorizing Provider  acetaminophen (TYLENOL) 325 MG tablet Take 1-2 tablets (325-650 mg total) by mouth every 4 (four) hours as needed for mild pain (pain score 1-3). 01/17/23   Setzer, Lynnell Jude, PA-C  amLODipine (NORVASC) 5 MG tablet Take 1 tablet by mouth daily. 05/23/22   [provider]  Apoaequorin 10 MG CAPS Take 1 capsule by mouth daily. Prevagen    [provider]  carbamazepine (CARBATROL) 200 MG 12 hr capsule Take 200 mg by mouth 4 (four) times daily.    [provider]  cholestyramine light (PREVALITE) 4 GM/DOSE powder Take 4 g by mouth 2 (two) times daily with a meal. 12/01/22   [provider]  gabapentin (NEURONTIN) 600 MG tablet Take 600 mg  by mouth 3 (three) times daily. 12/19/22   [provider]  glucagon (GLUCAGON EMERGENCY) 1 MG injection Inject 1 mg into the skin. 06/18/13   [provider]  losartan (COZAAR) 25 MG tablet Take 1 tablet (25 mg total) by mouth daily. 01/18/23   Setzer, Lynnell Jude, PA-C  Multiple Vitamin (MULTI-VITAMIN) tablet Take 1 tablet by mouth daily.    [provider]  omeprazole (PRILOSEC) 20 MG capsule Take 20 mg by mouth daily.    [provider]  rosuvastatin (CRESTOR) 10 MG tablet Take 1 tablet by mouth daily. 11/24/22   [provider]      Allergies    Patient has no known allergies.    Review of Systems   Review of Systems  Physical Exam Updated Vital Signs BP 138/68 (BP Location: Left Arm)   Pulse 75   Temp 98.5 F (36.9 C) (Oral)   Resp 19   Ht 1.676 m (5\' 6" )   Wt 84.9 kg   SpO2 100%   BMI 30.21 kg/m  Physical Exam Vitals and nursing note reviewed.  HENT:     Head: Normocephalic and atraumatic.     Right Ear: External ear normal.     Left Ear: External ear normal.     Nose: Nose normal.     Mouth/Throat:     Pharynx: Oropharynx is clear.  Eyes:     Pupils:  Pupils are equal, round, and reactive to light.  Cardiovascular:     Rate and Rhythm: Normal rate and regular rhythm.     Pulses: Normal pulses.  Pulmonary:     Effort: Pulmonary effort is normal.  Abdominal:     General: Abdomen is flat.     Palpations: Abdomen is soft.  Musculoskeletal:        General: Normal range of motion.     Cervical back: Normal range of motion.  Skin:    General: Skin is warm.     Capillary Refill: Capillary refill takes less than 2 seconds.  Neurological:     General: No focal deficit present.     Mental Status: He is alert.  Psychiatric:        Mood and Affect: Mood normal.     ED Results / Procedures / Treatments   Labs (all labs ordered are listed, but only abnormal results are displayed) Labs Reviewed  CBC - Abnormal; Notable for  the following components:      Result Value   RBC 3.66 (*)    Hemoglobin 11.2 (*)    HCT 34.7 (*)    All other components within normal limits  BASIC METABOLIC PANEL - Abnormal; Notable for the following components:   Sodium 129 (*)    Chloride 96 (*)    Glucose, Bld 306 (*)    Calcium 8.5 (*)    All other components within normal limits  URINALYSIS, ROUTINE W REFLEX MICROSCOPIC - Abnormal; Notable for the following components:   APPearance HAZY (*)    Glucose, UA >=500 (*)    Protein, ur 30 (*)    All other components within normal limits  I-STAT CG4 LACTIC ACID, ED - Abnormal; Notable for the following components:   Lactic Acid, Venous 4.2 (*)    All other components within normal limits  CBG MONITORING, ED - Abnormal; Notable for the following components:   Glucose-Capillary 242 (*)    All other components within normal limits  I-STAT CG4 LACTIC ACID, ED - Abnormal; Notable for the following components:   Lactic Acid, Venous 3.6 (*)    All other components within normal limits  CULTURE, BLOOD (ROUTINE X 2)  CULTURE, BLOOD (ROUTINE X 2)  TROPONIN I (HIGH SENSITIVITY)  TROPONIN I (HIGH SENSITIVITY)    EKG EKG Interpretation Date/Time:  Sunday January 29 2023 10:04:42 EST Ventricular Rate:  62 PR Interval:  169 QRS Duration:  100 QT Interval:  433 QTC Calculation: 440 R Axis:   58  Text Interpretation: Sinus rhythm ST elevation, consider inferior injury Confirmed by Margarita Grizzle [phone]) on 01/29/2023 12:35:20 PM  Radiology DG Chest Port 1 View  Result Date: 01/29/2023 CLINICAL DATA:  Syncope. EXAM: PORTABLE CHEST 1 VIEW COMPARISON:  01/04/2023 FINDINGS: The heart size and mediastinal contours are within normal limits. Mild bibasilar atelectasis. There is no evidence of pulmonary edema, consolidation, pneumothorax or pleural fluid. The visualized skeletal structures are unremarkable. IMPRESSION: Mild bibasilar atelectasis. Electronically Signed   By: Irish Lack  M.D.   On: 01/29/2023 10:58    Procedures .Critical Care  Performed by: Margarita Grizzle, MD Authorized by: Margarita Grizzle, MD   Critical care provider statement:    Critical care time (minutes):  75   Critical care start time:  01/29/2023 9:54 AM   Critical care end time:  01/29/2023 1:14 PM   Critical care was necessary to treat or prevent imminent or life-threatening deterioration of the following conditions:  Cardiac  failure   Critical care was time spent personally by me on the following activities:  Development of treatment plan with patient or surrogate, discussions with consultants, evaluation of patient's response to treatment, examination of patient, ordering and review of laboratory studies, ordering and review of radiographic studies, ordering and performing treatments and interventions, pulse oximetry, re-evaluation of patient's condition and review of old charts     Medications Ordered in ED Medications  lactated ringers bolus 1,000 mL (0 mLs Intravenous Stopped 01/29/23 1259)  aspirin chewable tablet 324 mg (324 mg Oral Given 01/29/23 1253)    ED Course/ Medical Decision Making/ A&P Clinical Course as of 01/29/23 1315  Sun Jan 29, 2023  1140 Lactic acid elevated at 4.2 CBC reviewed interpreted send for mild anemia hemoglobin 1.2 Patient metabolic panel reviewed interpreted send for hyponatremia with sodium of 129 with first prior 137 within 11 days [DR]  1141 X-Skyeler Scalese is reviewed reviewed and interpreted no evidence of acute abnormality is noted radiologist interpretation notes mild bibasilar atelectasis [DR]    Clinical Course User Index [DR] Margarita Grizzle, MD                                 Medical Decision Making Amount and/or Complexity of Data Reviewed Labs: ordered. Radiology: ordered.   1:15 PM EKG reviewed with ST elevation in 2 3 aVF.  Compared to old and not present previously. Patient is not having active chest pain.  He had a near syncopal episode.  He is  diabetic. Paged out as code STEMI and will Discussed with cardiologist. Discussed care with Dr. Excell Seltzer, on-call for cardiology.  We reviewed both EKGs.  Current EKG is improved from prior.  Patient has normal troponin we will trend troponins.  We will cancel STEMI for now.  They will follow on admission. 82 yo male recent admission for sepsis, elevated troponins at that time, coronary ct with calcifications presents today with syncope and weakness.  Patient actually hypotensive on EMS evaluation with resolution without acute intervention. ED evaluation patient has remained hemodynamically stable.  He has felt improved with no weakness here. Patient does have elevated lactic acid.  He is not febrile.  No obvious source of infection is found on his initial workup. IV fluids given No antibiotics given at this time based on obvious infection Will need third troponin Repeat lactic acid pending Discussed with Dr. Kirke Corin  who will see for admission  1- near syncope-cardiac versus hypotension versus arrhythmia.  Patient did have some initial hypotension.  Patient has been stable here with normal blood pressure, normal rhythm on monitor 2 elevated lactic acid initial 4.1 has been clearing to 3.5 with 1 L of fluid.  Patient was evaluated for infection with blood cultures, chest x-Kyan Yurkovich, urine with no definite etiology.  No leukocytosis and no fever.  This may represent lactic acidosis from his hypotension.  Will not start antibiotics at this time 3 EKG with ST elevation in 2 3 aVF.  Patient is not having any chest pain.  He is diabetic and had generalized weakness.  Troponin and repeat troponin both normal at 6.  Initially paged as STEMI but canceled in consultation with Dr. Excell Seltzer.  More likely to be demand ischemia with patient's hypotension.  He will need ongoing troponins and cardiology will consult during admission 4- hyponatremia patient's sodium is 123 which is down from 1 3711 days  ago.  Final Clinical Impression(s) / ED Diagnoses Final diagnoses:  Syncope and collapse  Abnormal EKG  Hyponatremia  Elevated lactic acid level    Rx / DC Orders ED Discharge Orders     None         Margarita Grizzle, MD 01/29/23 1315

## 2023-01-29 NOTE — ED Notes (Signed)
Heat applied to hand.

## 2023-01-29 NOTE — ED Notes (Signed)
ED TO INPATIENT HANDOFF REPORT  ED Nurse Name and Phone #: Osvaldo Shipper RN [phone]  S Name/Age/Gender Larry Lane 82 y.o. male Room/Bed: 034C/034C  Code Status   Code Status: Full Code  Home/SNF/Other Home Patient oriented to: self, place, time, and situation Is this baseline? Yes   Triage Complete: Triage complete  Chief Complaint Pre-syncope [R55]  Triage Note Pt BIB EMS from home after a few episodes of near syncope. Pt denies LOC and fall, stating he lowered himself to a chair. Pt is A&Ox4. Pt endorses recent hospitalization for sepsis.   98/62  190 BG 60 130/60  20 L H   Allergies No Known Allergies  Level of Care/Admitting Diagnosis ED Disposition     ED Disposition  Admit   Condition  --   Comment  Hospital Area: [facility] [[id number]]  Level of Care: Telemetry Medical [104]  May place patient in observation at [facility] or [facility] if equivalent level of care is available:: No  Covid Evaluation: Asymptomatic - no recent exposure (last 10 days) testing not required  Diagnosis: Pre-syncope [304451]  Admitting Physician: Steffanie Rainwater [[id number]]  Attending Physician: Steffanie Rainwater [[id number]]          B Medical/Surgery History Past Medical History:  Diagnosis Date   Abscess of back    Arthritis    Bacterial infection 2015   Cancer (HCC)    skin    Diabetes mellitus without complication (HCC)    Dysrhythmia    Edema    RIGHT ANKLE   GERD (gastroesophageal reflux disease)    Hypertension    Trigeminal nerve disorder    Trigeminal neuralgia    Past Surgical History:  Procedure Laterality Date   APPENDECTOMY  2010   CATARACT EXTRACTION W/PHACO Right 01/26/2017   Procedure: CATARACT EXTRACTION PHACO AND INTRAOCULAR LENS PLACEMENT (IOC);  Surgeon: Nevada Crane, MD;  Location: [facility] ORS;  Service: Ophthalmology;  Laterality: Right;  Lot # [id number] H [place]: 00:48.2 AP%: 9.9 CDE: 5.35   I&D of back abscess      TRIGEMINAL NERVE DECOMPRESSION       A IV Location/Drains/Wounds Patient Lines/Drains/Airways Status     Active Line/Drains/Airways     Name Placement date Placement time Site Days   Peripheral IV 01/05/23 20 G 1.88" Anterior;Left;Lateral Forearm 01/05/23  0228  Forearm  24            Intake/Output Last 24 hours  Intake/Output Summary (Last 24 hours) at 01/29/2023 1348 Last data filed at 01/29/2023 1259 Gross per 24 hour  Intake 992.2 ml  Output --  Net 992.2 ml    Labs/Imaging Results for orders placed or performed during the hospital encounter of 01/29/23 (from the past 48 hour(s))  CBC     Status: Abnormal   Collection Time: 01/29/23 10:01 AM  Result Value Ref Range   WBC 9.9 4.0 - 10.5 K/uL   RBC 3.66 (L) 4.22 - 5.81 MIL/uL   Hemoglobin 11.2 (L) 13.0 - 17.0 g/dL   HCT 44.0 (L) 10.2 - 72.5 %   MCV 94.8 80.0 - 100.0 fL   MCH 30.6 26.0 - 34.0 pg   MCHC 32.3 30.0 - 36.0 g/dL   RDW 36.6 44.0 - 34.7 %   Platelets 334 150 - 400 K/uL   nRBC 0.0 0.0 - 0.2 %    Comment: Performed at [facility], [address] [place], [place] [address]  Basic metabolic panel  Status: Abnormal   Collection Time: 01/29/23 10:01 AM  Result Value Ref Range   Sodium 129 (L) 135 - 145 mmol/L   Potassium 4.7 3.5 - 5.1 mmol/L   Chloride 96 (L) 98 - 111 mmol/L   CO2 23 22 - 32 mmol/L   Glucose, Bld 306 (H) 70 - 99 mg/dL    Comment: Glucose reference range applies only to samples taken after fasting for at least 8 hours.   BUN 12 8 - 23 mg/dL   Creatinine, Ser 7.82 0.61 - 1.24 mg/dL   Calcium 8.5 (L) 8.9 - 10.3 mg/dL   GFR, Estimated >95 >62 mL/min    Comment: (NOTE) Calculated using the CKD-EPI Creatinine Equation (2021)    Anion gap 10 5 - 15    Comment: Performed at [facility], [address], [place], [place] [address]  Troponin I (High Sensitivity)     Status: None   Collection Time: 01/29/23 10:01 AM  Result Value Ref Range   Troponin I (High Sensitivity) 6  <18 ng/L    Comment: (NOTE) Elevated high sensitivity troponin I (hsTnI) values and significant  changes across serial measurements may suggest ACS but many other  chronic and acute conditions are known to elevate hsTnI results.  Refer to the "Links" section for chest pain algorithms and additional  guidance. Performed at [facility], [address], [place], [place] [address]   Urinalysis, Routine w reflex microscopic -Urine, Clean Catch     Status: Abnormal   Collection Time: 01/29/23 10:01 AM  Result Value Ref Range   Color, Urine YELLOW YELLOW   APPearance HAZY (A) CLEAR   Specific Gravity, Urine 1.018 1.005 - 1.030   pH 6.0 5.0 - 8.0   Glucose, UA >=500 (A) NEGATIVE mg/dL   Hgb urine dipstick NEGATIVE NEGATIVE   Bilirubin Urine NEGATIVE NEGATIVE   Ketones, ur NEGATIVE NEGATIVE mg/dL   Protein, ur 30 (A) NEGATIVE mg/dL   Nitrite NEGATIVE NEGATIVE   Leukocytes,Ua NEGATIVE NEGATIVE   RBC / HPF 0-5 0 - 5 RBC/hpf   WBC, UA 0-5 0 - 5 WBC/hpf   Bacteria, UA NONE SEEN NONE SEEN   Squamous Epithelial / HPF 0-5 0 - 5 /HPF   Hyaline Casts, UA PRESENT    Amorphous Crystal PRESENT     Comment: Performed at [facility], [address] [place], [place] [address]  I-Stat CG4 Lactic Acid     Status: Abnormal   Collection Time: 01/29/23 10:08 AM  Result Value Ref Range   Lactic Acid, Venous 4.2 (HH) 0.5 - 1.9 mmol/L   Comment NOTIFIED PHYSICIAN   CBG monitoring, ED     Status: Abnormal   Collection Time: 01/29/23 10:42 AM  Result Value Ref Range   Glucose-Capillary 242 (H) 70 - 99 mg/dL    Comment: Glucose reference range applies only to samples taken after fasting for at least 8 hours.  Troponin I (High Sensitivity)     Status: None   Collection Time: 01/29/23 11:55 AM  Result Value Ref Range   Troponin I (High Sensitivity) 6 <18 ng/L    Comment: (NOTE) Elevated high sensitivity troponin I (hsTnI) values and significant  changes across serial measurements may suggest  ACS but many other  chronic and acute conditions are known to elevate hsTnI results.  Refer to the "Links" section for chest pain algorithms and additional  guidance. Performed at [facility], [address] [place], [place] [address]  I-Stat CG4 Lactic Acid     Status: Abnormal   Collection Time: 01/29/23  1:04 PM  Result Value Ref Range   Lactic Acid, Venous 3.6 (HH) 0.5 - 1.9 mmol/L   Comment NOTIFIED PHYSICIAN    DG Chest Port 1 View  Result Date: 01/29/2023 CLINICAL DATA:  Syncope. EXAM: PORTABLE CHEST 1 VIEW COMPARISON:  01/04/2023 FINDINGS: The heart size and mediastinal contours are within normal limits. Mild bibasilar atelectasis. There is no evidence of pulmonary edema, consolidation, pneumothorax or pleural fluid. The visualized skeletal structures are unremarkable. IMPRESSION: Mild bibasilar atelectasis. Electronically Signed   By: Irish Lack M.D.   On: 01/29/2023 10:58    Pending Labs Unresulted Labs (From admission, onward)     Start     Ordered   02/05/23 0500  Creatinine, serum  (enoxaparin (LOVENOX)    CrCl >/= 30 ml/min)  Weekly,   R     Comments: while on enoxaparin therapy    01/29/23 1339   01/29/23 0952  Blood culture (routine x 2)  BLOOD CULTURE X 2,   R (with STAT occurrences)      01/29/23 0952            Vitals/Pain Today's Vitals   01/29/23 1007 01/29/23 1008 01/29/23 1200 01/29/23 1230  BP: 134/69 138/68  139/74  Pulse: 73 75  60  Resp: 16 19    Temp:   98.5 F (36.9 C)   TempSrc:      SpO2: 100% 100%  100%  Weight:      Height:      PainSc:        Isolation Precautions No active isolations  Medications Medications  enoxaparin (LOVENOX) injection 40 mg (has no administration in time range)  acetaminophen (TYLENOL) tablet 650 mg (has no administration in time range)    Or  acetaminophen (TYLENOL) suppository 650 mg (has no administration in time range)  senna-docusate (Senokot-S) tablet 1 tablet (has no administration  in time range)  ondansetron (ZOFRAN) tablet 4 mg (has no administration in time range)    Or  ondansetron (ZOFRAN) injection 4 mg (has no administration in time range)  insulin aspart (novoLOG) injection 0-15 Units (has no administration in time range)  insulin aspart (novoLOG) injection 0-5 Units (has no administration in time range)  lactated ringers bolus 1,000 mL (0 mLs Intravenous Stopped 01/29/23 1259)  aspirin chewable tablet 324 mg (324 mg Oral Given 01/29/23 1253)    Mobility walks     Focused Assessments Cardiac Assessment Handoff:    No results found for: "CKTOTAL", "CKMB", "CKMBINDEX", "TROPONINI" No results found for: "DDIMER" Does the Patient currently have chest pain? No    R Recommendations: See Admitting Provider Note  Report given to:   Additional Notes:

## 2023-01-29 NOTE — Progress Notes (Signed)
   01/29/23 1303  Spiritual Encounters  Type of Visit Initial  Care provided to: Pt and family  Conversation partners present during Programmer, systems;Other (comment) (Pharm D)  Referral source Code page  Reason for visit Code  OnCall Visit Yes  Spiritual Framework  Presenting Themes Impactful experiences and emotions;Values and beliefs  Values/beliefs belief in god and prayer  Patient Stress Factors Health changes  Family Stress Factors Major life changes  Interventions  Spiritual Care Interventions Made Established relationship of care and support;Compassionate presence;Reflective listening;Normalization of emotions;Prayer;Encouragement  Intervention Outcomes  Outcomes Reduced fear;Reduced anxiety;Awareness of support;Connection to spiritual care;Awareness around self/spiritual resourses  Spiritual Care Plan  Spiritual Care Issues Still Outstanding No further spiritual care needs at this time (see row info)   Chaplain responded to code STEMI. Chaplain provided support for the patient and his wife Engineer, maintenance). Wife requested chaplain to pray for the patient. Chaplain prayed and provided a compassionate presence. Code was canceled.   Arlyce Dice, Chaplain Resident [phone]

## 2023-01-29 NOTE — ED Triage Notes (Addendum)
Pt BIB EMS from home after a few episodes of near syncope. Pt denies LOC and fall, stating he lowered himself to a chair. Pt is A&Ox4. Pt endorses recent hospitalization for sepsis.   98/62  190 BG 60 130/60  20 L H

## 2023-01-29 NOTE — ED Notes (Signed)
Orthostatic VS: pt denied SOB, CP, lightheadedness and dizziness throughout.    01/29/23 1005 01/29/23 1007 01/29/23 1008  Vitals  BP 133/63 134/69 138/68  MAP (mmHg) 83 86 88  BP Location Left Arm Left Arm Left Arm  BP Method Automatic Automatic Automatic  Patient Position (if appropriate) Lying Sitting Standing  Pulse Rate 64 73 75  Pulse Rate Source Monitor Monitor Monitor  ECG Heart Rate 68 74 79  Resp 20 16 19   Level of Consciousness  Level of Consciousness Alert Alert Alert  MEWS COLOR  MEWS Score Color Riki Sheer Green  Oxygen Therapy  SpO2 97 % 100 % 100 %

## 2023-01-30 DIAGNOSIS — R55 Syncope and collapse: Secondary | ICD-10-CM | POA: Diagnosis not present

## 2023-01-30 LAB — CBC
HCT: 32.5 % — ABNORMAL LOW (ref 39.0–52.0)
Hemoglobin: 10.4 g/dL — ABNORMAL LOW (ref 13.0–17.0)
MCH: 29.9 pg (ref 26.0–34.0)
MCHC: 32 g/dL (ref 30.0–36.0)
MCV: 93.4 fL (ref 80.0–100.0)
Platelets: 294 10*3/uL (ref 150–400)
RBC: 3.48 MIL/uL — ABNORMAL LOW (ref 4.22–5.81)
RDW: 12.5 % (ref 11.5–15.5)
WBC: 6.3 10*3/uL (ref 4.0–10.5)
nRBC: 0 % (ref 0.0–0.2)

## 2023-01-30 LAB — RENAL FUNCTION PANEL
Albumin: 2.9 g/dL — ABNORMAL LOW (ref 3.5–5.0)
Anion gap: 10 (ref 5–15)
BUN: 8 mg/dL (ref 8–23)
CO2: 24 mmol/L (ref 22–32)
Calcium: 8.7 mg/dL — ABNORMAL LOW (ref 8.9–10.3)
Chloride: 102 mmol/L (ref 98–111)
Creatinine, Ser: 0.97 mg/dL (ref 0.61–1.24)
GFR, Estimated: >60 mL/min (ref >60)
Glucose, Bld: 197 mg/dL — ABNORMAL HIGH (ref 70–99)
Phosphorus: 3.7 mg/dL (ref 2.5–4.6)
Potassium: 5 mmol/L (ref 3.5–5.1)
Sodium: 136 mmol/L (ref 135–145)

## 2023-01-30 LAB — MAGNESIUM: Magnesium: 1.9 mg/dL (ref 1.7–2.4)

## 2023-01-30 LAB — GLUCOSE, CAPILLARY
Glucose-Capillary: 192 mg/dL — ABNORMAL HIGH (ref 70–99)
Glucose-Capillary: 208 mg/dL — ABNORMAL HIGH (ref 70–99)
Glucose-Capillary: 190 mg/dL — ABNORMAL HIGH (ref 70–99)

## 2023-01-30 NOTE — Progress Notes (Signed)
DISCHARGE NOTE HOME HANSEN SWITZER to be discharged Home per MD order. Discussed prescriptions and follow up appointments with the patient. Prescriptions given to patient; medication list explained in detail. Patient verbalized understanding.  Skin clean, dry and intact without evidence of skin break down, no evidence of skin tears noted. IV catheter discontinued intact. Site without signs and symptoms of complications. Dressing and pressure applied. Pt denies pain at the site currently. No complaints noted.  Patient free of lines, drains, and wounds.   An After Visit Summary (AVS) was printed and given to the patient. Patient escorted via wheelchair, and discharged home via private auto.  Margarita Grizzle, RN

## 2023-01-30 NOTE — Evaluation (Signed)
Physical Therapy Evaluation and Discharge Patient Details Name: Larry Lane MRN: [id number] DOB: [birth year] Today's Date: 01/30/2023  History of Present Illness  Larry Lane is a 82 y.o. male with medical history significant of T2DM, HTN, HLD, trigeminal neuralgia, GERD and arthritis who presented to the ED for evaluation of presyncope.  Clinical Impression   Patient evaluated by Physical Therapy with no further acute PT needs identified, as pt is to dc home later today; All education has been completed and the patient has no further questions.  See below for any follow-up Physical Therapy or equipment needs. PT is signing off. Thank you for this referral.         If plan is discharge home, recommend the following: A little help with walking and/or transfers   Can travel by private vehicle   Yes    Equipment Recommendations None recommended by PT  Recommendations for Other Services       Functional Status Assessment Patient has had a recent decline in their functional status and demonstrates the ability to make significant improvements in function in a reasonable and predictable amount of time.     Precautions / Restrictions Precautions Precautions: Fall      Mobility  Bed Mobility Overal bed mobility: Needs Assistance Bed Mobility: Supine to Sit     Supine to sit: Used rails, Supervision     General bed mobility comments: Supervision for safety    Transfers Overall transfer level: Needs assistance Equipment used: Rolling walker (2 wheels) Transfers: Sit to/from Stand Sit to Stand: Contact guard assist           General transfer comment: cues for safe hand placement and increased time to reach full upright posture; bed elevated to apporximate home    Ambulation/Gait Ambulation/Gait assistance: Contact guard assist, Min assist Gait Distance (Feet): 150 Feet Assistive device: Rolling walker (2 wheels) Gait Pattern/deviations: Step-through pattern,  Decreased stride length, Trunk flexed       General Gait Details: Slow steps and cues to self-monitor for activity tolerance  Stairs            Wheelchair Mobility     Tilt Bed    Modified Rankin (Stroke Patients Only)       Balance     Sitting balance-Leahy Scale: Fair       Standing balance-Leahy Scale: Poor (approaching Fair)                               Pertinent Vitals/Pain Pain Assessment Pain Assessment: Faces Faces Pain Scale: No hurt Pain Intervention(s): Monitored during session    Home Living Family/patient expects to be discharged to:: Private residence Living Arrangements: Spouse/significant other Available Help at Discharge: Family;Available 24 hours/day Type of Home: House Home Access: Stairs to enter Entrance Stairs-Rails: Doctor, general practice of Steps: 2 steps into home from garage with a cabinet on the L to reach   Home Layout: One level;Other (Comment) (sunken living room) Home Equipment: Agricultural consultant (2 wheels)      Prior Function Prior Level of Function : Independent/Modified Independent;Driving;History of Falls (last six months)             Mobility Comments: ind, retired Visual merchandiser ADLs Comments: ind     Extremity/Trunk Assessment   Upper Extremity Assessment Upper Extremity Assessment: Overall WFL for tasks assessed (for simple tasks)    Lower Extremity Assessment Lower Extremity Assessment: Generalized weakness    Cervical /  Trunk Assessment Cervical / Trunk Assessment: Other exceptions (weakness)  Communication   Communication Communication: Hearing impairment Cueing Techniques: Verbal cues  Cognition Arousal: Alert Behavior During Therapy: WFL for tasks assessed/performed Overall Cognitive Status: History of cognitive impairments - at baseline                                 General Comments: Increased cues during session and wife reporting that pt is [name]         General Comments General comments (skin integrity, edema, etc.): Wife present and supportive; jBP standig during walk 166/73, HR 82    Exercises     Assessment/Plan    PT Assessment All further PT needs can be met in the next venue of care  PT Problem List Decreased strength;Decreased range of motion;Decreased balance;Decreased activity tolerance;Decreased mobility;Decreased cognition;Decreased knowledge of use of DME;Cardiopulmonary status limiting activity;Impaired tone;Pain       PT Treatment Interventions      PT Goals (Current goals can be found in the Care Plan section)  Acute Rehab PT Goals Patient Stated Goal: Get well PT Goal Formulation: All assessment and education complete, DC therapy    Frequency       Co-evaluation               AM-PAC PT "6 Clicks" Mobility  Outcome Measure Help needed turning from your back to your side while in a flat bed without using bedrails?: None Help needed moving from lying on your back to sitting on the side of a flat bed without using bedrails?: None Help needed moving to and from a bed to a chair (including a wheelchair)?: A Little Help needed standing up from a chair using your arms (e.g., wheelchair or bedside chair)?: A Little Help needed to walk in hospital room?: A Little Help needed climbing 3-5 steps with a railing? : A Little 6 Click Score: 20    End of Session Equipment Utilized During Treatment: Gait belt Activity Tolerance: Patient tolerated treatment well Patient left: in bed;with family/visitor present (sitting EOB) Nurse Communication: Mobility status PT Visit Diagnosis: Unsteadiness on feet (R26.81);Other abnormalities of gait and mobility (R26.89);Muscle weakness (generalized) (M62.81);Difficulty in walking, not elsewhere classified (R26.2);Pain    Time: 4098-1191 PT Time Calculation (min) (ACUTE ONLY): 16 min   Charges:   PT Evaluation $PT Eval Low Complexity: 1 Low   PT General Charges $$ ACUTE PT  VISIT: 1 Visit         Van Clines, PT  Acute Rehabilitation Services Office [phone] Secure Chat welcomed   Levi Aland 01/30/2023, 2:10 PM

## 2023-01-30 NOTE — Plan of Care (Signed)

## 2023-01-30 NOTE — Discharge Summary (Signed)
Physician Discharge Summary  Larry Lane BJY:[id number] DOB: 09-22-1940 DOA: 01/29/2023  PCP: Jerl Mina, MD  Admit date: 01/29/2023 Discharge date: 01/30/2023  Admitted From: Home Disposition: Home  Recommendations for Outpatient Follow-up:  Follow up with PCP in 1-2 weeks Follow-up with cardiology as scheduled:  Home Health: None Equipment/Devices: No new equipment  Discharge Condition: Stable CODE STATUS: Full Diet recommendation: Low-salt low-fat low-carb diet  Brief/Interim Summary: Larry Lane is a 82 y.o. male with medical history significant of T2DM, HTN, HLD, trigeminal neuralgia, GERD and arthritis who presented to the ED for evaluation of presyncope.  Patient admitted as above with syncope versus presyncope initially noted to be a code STEMI but this was discontinued once evaluated by cardiology.  Patient's cardiac workup is otherwise unremarkable, orthostatics today are negative, given symptoms, hypovolemia hyponatremia patient likely had vasovagal versus orthostatic event prior to admission.  At this time he is otherwise stable return to baseline and agreeable for discharge home.  Close follow-up with PCP and cardiology as scheduled.    Discharge Diagnoses:  Principal Problem:   Pre-syncope Active Problems:   Abnormal EKG   Hyponatremia   Elevated lactic acid level  Presyncope Likely orthostatic versus vagal, resolved   Hypovolemic hyponatremia, resolved Submassive PE ruled out on repeat imaging, no indication for anticoagulation  T2DM Trigeminal neuralgia HTN HLD GERD  Discharge Instructions   Allergies as of 01/30/2023   No Known Allergies      Medication List     STOP taking these medications    aspirin EC 81 MG tablet       TAKE these medications    acetaminophen 325 MG tablet Commonly known as: TYLENOL Take 1-2 tablets (325-650 mg total) by mouth every 4 (four) hours as needed for mild pain (pain score 1-3).   amLODipine  5 MG tablet Commonly known as: NORVASC Take 1 tablet by mouth daily.   Apoaequorin 10 MG Caps Take 1 capsule by mouth daily. Prevagen   carbamazepine 200 MG 12 hr capsule Commonly known as: CARBATROL Take 200 mg by mouth See admin instructions. Take 200mg  by mouth every 6 hours.   cholestyramine light 4 GM/DOSE powder Commonly known as: PREVALITE Take 4 g by mouth 2 (two) times daily with a meal.   gabapentin 600 MG tablet Commonly known as: NEURONTIN Take 600 mg by mouth See admin instructions. Take 600mg  by mouth every 6 hours.   insulin lispro 100 UNIT/ML cartridge Commonly known as: HUMALOG 20-30 Units. Per pump.   losartan 25 MG tablet Commonly known as: COZAAR Take 1 tablet (25 mg total) by mouth daily.   Multi-Vitamin tablet Take 1 tablet by mouth daily.   omeprazole 20 MG capsule Commonly known as: PRILOSEC Take 20 mg by mouth daily.   rosuvastatin 10 MG tablet Commonly known as: CRESTOR Take 1 tablet by mouth daily.        No Known Allergies  Consultations: Cardiology  Procedures/Studies: ECHOCARDIOGRAM LIMITED  Result Date: 01/29/2023    ECHOCARDIOGRAM LIMITED REPORT   Patient Name:   Larry Lane Date of Exam: 01/29/2023 Medical Rec #:  [id number]       Height:       66.0 in Accession #:    [id number]      Weight:       171.0 lb Date of Birth:  April 02, 1940      BSA:          1.871 m Patient Age:    82 years  BP:           152/70 mmHg Patient Gender: M               HR:           63 bpm. Exam Location:  Inpatient Procedure: Limited Echo, Color Doppler and Cardiac Doppler Indications:    dyspnea  History:        Patient has prior history of Echocardiogram examinations, most                 recent 01/17/2023. Risk Factors:Dyslipidemia and Diabetes.  Sonographer:    Delcie Roch RDCS Referring Phys: [id number] Ronnald Ramp O'NEAL IMPRESSIONS  1. Left ventricular ejection fraction, by estimation, is 60 to 65%. The left ventricle has normal function.  The left ventricle has no regional wall motion abnormalities. Left ventricular diastolic parameters are consistent with Grade I diastolic dysfunction (impaired relaxation).  2. Right ventricular systolic function is normal. The right ventricular size is mildly enlarged.  3. Redundant chordal tissue noted without evidence of LVOT obstruction. The mitral valve is grossly normal. Trivial mitral valve regurgitation. No evidence of mitral stenosis.  4. The aortic valve is tricuspid. Aortic valve regurgitation is not visualized. Aortic valve sclerosis is present, with no evidence of aortic valve stenosis.  5. The inferior vena cava is normal in size with greater than 50% respiratory variability, suggesting right atrial pressure of 3 mmHg. Comparison(s): Changes from prior study are noted. RV appears mildly dilated, but RV function is normal. FINDINGS  Left Ventricle: Left ventricular ejection fraction, by estimation, is 60 to 65%. The left ventricle has normal function. The left ventricle has no regional wall motion abnormalities. The left ventricular internal cavity size was normal in size. There is  no left ventricular hypertrophy. Left ventricular diastolic parameters are consistent with Grade I diastolic dysfunction (impaired relaxation). Right Ventricle: The right ventricular size is mildly enlarged. No increase in right ventricular wall thickness. Right ventricular systolic function is normal. Left Atrium: Left atrial size was normal in size. Right Atrium: Right atrial size was normal in size. Pericardium: There is no evidence of pericardial effusion. Mitral Valve: Redundant chordal tissue noted without evidence of LVOT obstruction. The mitral valve is grossly normal. Trivial mitral valve regurgitation. No evidence of mitral valve stenosis. Tricuspid Valve: The tricuspid valve is grossly normal. Tricuspid valve regurgitation is trivial. No evidence of tricuspid stenosis. Aortic Valve: The aortic valve is tricuspid.  Aortic valve regurgitation is not visualized. Aortic valve sclerosis is present, with no evidence of aortic valve stenosis. Pulmonic Valve: The pulmonic valve was grossly normal. Pulmonic valve regurgitation is not visualized. Aorta: The aortic root and ascending aorta are structurally normal, with no evidence of dilitation. Venous: The inferior vena cava is normal in size with greater than 50% respiratory variability, suggesting right atrial pressure of 3 mmHg. Additional Comments: Spectral Doppler performed. Color Doppler performed.  LEFT VENTRICLE PLAX 2D LVIDd:         3.60 cm LVIDs:         2.50 cm LV PW:         0.90 cm LV IVS:        1.10 cm  IVC IVC diam: 1.70 cm AORTIC VALVE LVOT Vmax:   113.00 cm/s LVOT Vmean:  76.400 cm/s LVOT VTI:    0.295 m  AORTA Ao Asc diam: 3.40 cm MITRAL VALVE MV Area (PHT): 2.37 cm     SHUNTS MV Decel Time: 320 msec  Systemic VTI: 0.30 m MV E velocity: 110.00 cm/s MV A velocity: 161.00 cm/s MV E/A ratio:  0.68 Lennie Odor MD Electronically signed by Lennie Odor MD Signature Date/Time: 01/29/2023/4:40:13 PM    Final    DG Chest Port 1 View  Result Date: 01/29/2023 CLINICAL DATA:  Syncope. EXAM: PORTABLE CHEST 1 VIEW COMPARISON:  01/04/2023 FINDINGS: The heart size and mediastinal contours are within normal limits. Mild bibasilar atelectasis. There is no evidence of pulmonary edema, consolidation, pneumothorax or pleural fluid. The visualized skeletal structures are unremarkable. IMPRESSION: Mild bibasilar atelectasis. Electronically Signed   By: Irish Lack M.D.   On: 01/29/2023 10:58   ECHOCARDIOGRAM LIMITED  Result Date: 01/17/2023    ECHOCARDIOGRAM LIMITED REPORT   Patient Name:   Larry Lane Date of Exam: 01/17/2023 Medical Rec #:  [id number]       Height:       66.0 in Accession #:    [id number]      Weight:       187.2 lb Date of Birth:  01-11-1941      BSA:          1.944 m Patient Age:    81 years        BP:           145/75 mmHg Patient Gender: M                HR:           72 bpm. Exam Location:  Inpatient Procedure: Limited Echo Indications:    Other abnormalities of the heart R00.8  History:        Patient has prior history of Echocardiogram examinations, most                 recent 01/15/2023. Risk Factors:Diabetes and Hypertension.  Sonographer:    Darlys Gales Referring Phys: [id number] PAULA V ROSS IMPRESSIONS  1. Left ventricular ejection fraction, by estimation, is 60 to 65%. The left ventricle has normal function. The left ventricle has no regional wall motion abnormalities.  2. Right ventricular systolic function is moderately reduced primarily in the mid and basal segments. The right ventricular size is moderately to severely enlarged.  3. The mitral valve is degenerative. No evidence of mitral valve regurgitation. No evidence of mitral stenosis.  4. The aortic valve is normal in structure. Aortic valve regurgitation is not visualized. No aortic stenosis is present. FINDINGS  Left Ventricle: Left ventricular ejection fraction, by estimation, is 60 to 65%. The left ventricle has normal function. The left ventricle has no regional wall motion abnormalities. The left ventricular internal cavity size was normal in size. There is  no left ventricular hypertrophy. Right Ventricle: The right ventricular size is severely enlarged. No increase in right ventricular wall thickness. Right ventricular systolic function is moderately reduced. Left Atrium: Left atrial size was normal in size. Right Atrium: Right atrial size was normal in size. Pericardium: There is no evidence of pericardial effusion. Mitral Valve: The mitral valve is degenerative in appearance. There is moderate calcification of the mitral valve leaflet(s). Mild mitral annular calcification. No evidence of mitral valve stenosis. Tricuspid Valve: The tricuspid valve is normal in structure. Tricuspid valve regurgitation is not demonstrated. No evidence of tricuspid stenosis. Aortic Valve: The aortic valve  is normal in structure. Aortic valve regurgitation is not visualized. No aortic stenosis is present. Pulmonic Valve: The pulmonic valve was normal in structure. Pulmonic valve regurgitation is not visualized. No evidence of pulmonic  stenosis. Aorta: The aortic root is normal in size and structure. Venous: The inferior vena cava was not well visualized. IAS/Shunts: No atrial level shunt detected by color flow Doppler. LEFT VENTRICLE PLAX 2D LVIDd:         4.30 cm LVIDs:         3.00 cm LV PW:         1.00 cm LV IVS:        1.10 cm LVOT diam:     2.00 cm LVOT Area:     3.14 cm  RIGHT VENTRICLE RV Basal diam:  5.00 cm RV Mid diam:    4.40 cm LEFT ATRIUM             Index LA Vol (A2C):   65.4 ml 33.64 ml/m LA Vol (A4C):   39.8 ml 20.47 ml/m LA Biplane Vol: 53.2 ml 27.36 ml/m   AORTA Ao Root diam: 3.40 cm  SHUNTS Systemic Diam: 2.00 cm Armanda Magic MD Electronically signed by Armanda Magic MD Signature Date/Time: 01/17/2023/11:50:42 AM    Final    ECHOCARDIOGRAM LIMITED  Result Date: 01/15/2023    ECHOCARDIOGRAM LIMITED REPORT   Patient Name:   Larry Lane Date of Exam: 01/15/2023 Medical Rec #:  [id number]       Height:       66.0 in Accession #:    [id number]      Weight:       187.2 lb Date of Birth:  03/07/1940      BSA:          1.944 m Patient Age:    81 years        BP:           154/74 mmHg Patient Gender: M               HR:           79 bpm. Exam Location:  Inpatient Procedure: Limited Echo, Color Doppler and Cardiac Doppler Indications:    other abnormalities of the heart  History:        Patient has prior history of Echocardiogram examinations, most                 recent 01/05/2023. Risk Factors:Hypertension, Dyslipidemia and                 Diabetes.  Sonographer:    Delcie Roch RDCS Referring Phys: [id number] PAMELA S LOVE IMPRESSIONS  1. Left ventricular ejection fraction, by estimation, is 70 to 75%. The left ventricle has hyperdynamic function. The left ventricle has no regional wall motion  abnormalities. There is mild asymmetric left ventricular hypertrophy of the basal-septal segment. Indeterminate diastolic filling due to E-A fusion.  2. Right ventricular systolic function is mildly reduced. The right ventricular size is severely enlarged.  3. The mitral valve is grossly normal. No evidence of mitral valve regurgitation. No evidence of mitral stenosis.  4. The aortic valve is tricuspid. Aortic valve regurgitation is not visualized. Aortic valve sclerosis is present, with no evidence of aortic valve stenosis.  5. The inferior vena cava is normal in size with greater than 50% respiratory variability, suggesting right atrial pressure of 3 mmHg. Comparison(s): No significant change from prior study. Prior images reviewed side by side. FINDINGS  Left Ventricle: Left ventricular ejection fraction, by estimation, is 70 to 75%. The left ventricle has hyperdynamic function. The left ventricle has no regional wall motion abnormalities. There is mild asymmetric left ventricular hypertrophy  of the basal-septal segment. Indeterminate diastolic filling due to E-A fusion. Right Ventricle: The right ventricular size is severely enlarged. Right ventricular systolic function is mildly reduced. Left Atrium: Left atrial size was normal in size. Right Atrium: Right atrial size was normal in size. Pericardium: There is no evidence of pericardial effusion. Mitral Valve: The mitral valve is grossly normal. No evidence of mitral valve stenosis. Tricuspid Valve: The tricuspid valve is normal in structure. Tricuspid valve regurgitation is not demonstrated. No evidence of tricuspid stenosis. Aortic Valve: The aortic valve is tricuspid. There is moderate aortic valve annular calcification. Aortic valve regurgitation is not visualized. Aortic valve sclerosis is present, with no evidence of aortic valve stenosis. Pulmonic Valve: The pulmonic valve was normal in structure. Pulmonic valve regurgitation is not visualized. No  evidence of pulmonic stenosis. Venous: The inferior vena cava is normal in size with greater than 50% respiratory variability, suggesting right atrial pressure of 3 mmHg. IAS/Shunts: No atrial level shunt detected by color flow Doppler. Additional Comments: Spectral Doppler performed. Color Doppler performed.  LEFT VENTRICLE PLAX 2D LVIDd:         3.20 cm   Diastology LVIDs:         2.00 cm   LV e' medial:    5.00 cm/s LV PW:         1.10 cm   LV E/e' medial:  20.2 LV IVS:        1.20 cm   LV e' lateral:   5.33 cm/s LVOT diam:     2.00 cm   LV E/e' lateral: 18.9 LV SV:         92 LV SV Index:   47 LVOT Area:     3.14 cm  IVC IVC diam: 1.70 cm LEFT ATRIUM         Index LA diam:    3.80 cm 1.95 cm/m  AORTIC VALVE LVOT Vmax:   129.00 cm/s LVOT Vmean:  87.300 cm/s LVOT VTI:    0.293 m  AORTA Ao Asc diam: 3.60 cm MITRAL VALVE MV Area (PHT): 2.69 cm     SHUNTS MV Decel Time: 282 msec     Systemic VTI:  0.29 m MV E velocity: 101.00 cm/s  Systemic Diam: 2.00 cm MV A velocity: 151.00 cm/s MV E/A ratio:  0.67 Riley Lam MD Electronically signed by Riley Lam MD Signature Date/Time: 01/15/2023/2:09:07 PM    Final    CT Angio Chest Pulmonary Embolism (PE) W or WO Contrast  Result Date: 01/13/2023 CLINICAL DATA:  Pulmonary embolism (PE) suspected, high prob. Follow-up previously questioned left upper and left lower lobe pulmonary emboli. EXAM: CT ANGIOGRAPHY CHEST WITH CONTRAST TECHNIQUE: Multidetector CT imaging of the chest was performed using the standard protocol during bolus administration of intravenous contrast. Multiplanar CT image reconstructions and MIPs were obtained to evaluate the vascular anatomy. RADIATION DOSE REDUCTION: This exam was performed according to the departmental dose-optimization program which includes automated exposure control, adjustment of the mA and/or kV according to patient size and/or use of iterative reconstruction technique. CONTRAST:  75mL OMNIPAQUE IOHEXOL 350  MG/ML SOLN COMPARISON:  CTA chest 01/04/2023. FINDINGS: Cardiovascular: Satisfactory opacification of the pulmonary arteries to the segmental level. No evidence of pulmonary embolism. Normal heart size. No pericardial effusion. Extensive coronary artery calcifications and atherosclerotic calcifications of the thoracic aorta. Mediastinum/Nodes: No enlarged mediastinal, hilar, or axillary lymph nodes. Thyroid gland, trachea, and esophagus demonstrate no significant findings. Lungs/Pleura: No consolidation or pulmonary edema. Trace left pleural effusion.  No pneumothorax. Upper Abdomen: No acute abnormality. Musculoskeletal: No chest wall abnormality. No acute or significant osseous findings. Review of the MIP images confirms the above findings. IMPRESSION: 1. No evidence of pulmonary embolism. 2. Trace left pleural effusion. 3. Extensive coronary artery calcifications. Aortic Atherosclerosis (ICD10-I70.0). Electronically Signed   By: Orvan Falconer M.D.   On: 01/13/2023 17:29   MR FEMUR LEFT WO CONTRAST  Result Date: 01/13/2023 CLINICAL DATA:  Left thigh pain and weakness. EXAM: MR OF THE LEFT FEMUR WITHOUT CONTRAST TECHNIQUE: Multiplanar, multisequence MR imaging of the left thigh was performed. No intravenous contrast was administered. COMPARISON:  Left femur x-rays dated January 06, 2023. FINDINGS: Bones/Joint/Cartilage No marrow signal abnormality. No fracture or dislocation. Mild bilateral hip degenerative changes. Prior left knee medial unicompartmental arthroplasty. Small bilateral knee joint effusions. Ligaments Collateral ligaments are intact. Muscles and Tendons Intact. Scattered muscle edema within the left thigh, predominantly involving the sartorius, vastus medialis, and vastus lateralis muscles. There is also involvement of the distal left iliopsoas muscle. Mild fatty infiltration of the left thigh muscles, with more focal atrophy of the adductor magnus and rectus femoris muscles. No significant  muscle edema in the visualized right thigh on the coronal sequences. Soft tissue Mild thigh soft tissue swelling, greatest laterally. No fluid collection or hematoma. No soft tissue mass. Moderate right and trace left hydroceles. IMPRESSION: 1. Scattered muscle edema within the left thigh, predominantly involving the sartorius, vastus medialis, and vastus lateralis muscles. There is also involvement of the distal left iliopsoas muscle. Findings are nonspecific and may be related to myositis or denervation. 2. No acute osseous abnormality. Electronically Signed   By: Obie Dredge M.D.   On: 01/13/2023 12:12   DG Knee Complete 4 Views Left  Result Date: 01/06/2023 CLINICAL DATA:  Left thigh pain without known injury. EXAM: LEFT KNEE - COMPLETE 4+ VIEW COMPARISON:  None Available. FINDINGS: Status post left medial hemiarthroplasty. No fracture or dislocation is noted. Vascular calcifications are noted. IMPRESSION: No acute abnormality seen. Electronically Signed   By: Lupita Raider M.D.   On: 01/06/2023 14:28   DG FEMUR MIN 2 VIEWS LEFT  Result Date: 01/06/2023 CLINICAL DATA:  Left leg pain without known injury. EXAM: LEFT FEMUR 2 VIEWS COMPARISON:  None Available. FINDINGS: There is no evidence of fracture or other focal bone lesions. Soft tissues are unremarkable. Status post left medial hemiarthroplasty. IMPRESSION: No acute abnormality seen. Electronically Signed   By: Lupita Raider M.D.   On: 01/06/2023 14:26   DG HIP UNILAT WITH PELVIS MIN 4 VIEWS LEFT  Result Date: 01/06/2023 CLINICAL DATA:  Left hip pain without known injury. EXAM: DG HIP (WITH OR WITHOUT PELVIS) 4+V LEFT COMPARISON:  None Available. FINDINGS: There is no evidence of hip fracture or dislocation. Minimal osteophyte formation of left hip is noted. 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 No acute abnormality seen. Electronically Signed   By: Lupita Raider M.D.   On: 01/06/2023 14:25   VAS [place] LOWER  EXTREMITY VENOUS (DVT)  Result Date: 01/05/2023  Lower Venous DVT Study Patient Name:  Larry Lane  Date of Exam:   01/05/2023 Medical Rec #: [id number]        Accession #:    [id number] Date of Birth: Nov 25, 1940       Patient Gender: M Patient Age:   41 years Exam Location:  [facility] Procedure:      VAS [place] LOWER EXTREMITY VENOUS (DVT) Referring Phys:  LAURA CLARK --------------------------------------------------------------------------------  Indications: Edema, and pulmonary embolism.  Risk Factors: Confirmed PE. Comparison Study: No prior exam. Performing Technologist: Fernande Bras  Examination Guidelines: A complete evaluation includes B-mode imaging, spectral Doppler, color Doppler, and power Doppler as needed of all accessible portions of each vessel. Bilateral testing is considered an integral part of a complete examination. Limited examinations for reoccurring indications may be performed as noted. The reflux portion of the exam is performed with the patient in reverse Trendelenburg.  +---------+---------------+---------+-----------+----------+--------------+ RIGHT    CompressibilityPhasicitySpontaneityPropertiesThrombus Aging +---------+---------------+---------+-----------+----------+--------------+ CFV      Full           Yes      Yes                                 +---------+---------------+---------+-----------+----------+--------------+ SFJ      Full                                                        +---------+---------------+---------+-----------+----------+--------------+ FV Prox  Full                                                        +---------+---------------+---------+-----------+----------+--------------+ FV Mid   Full                                                        +---------+---------------+---------+-----------+----------+--------------+ FV DistalFull                                                         +---------+---------------+---------+-----------+----------+--------------+ PFV      Full                                                        +---------+---------------+---------+-----------+----------+--------------+ POP      Full           Yes      Yes                                 +---------+---------------+---------+-----------+----------+--------------+ PTV      Full                                                        +---------+---------------+---------+-----------+----------+--------------+ PERO     Full                                                        +---------+---------------+---------+-----------+----------+--------------+   +---------+---------------+---------+-----------+----------+--------------+  LEFT     CompressibilityPhasicitySpontaneityPropertiesThrombus Aging +---------+---------------+---------+-----------+----------+--------------+ CFV      Full           Yes      Yes                                 +---------+---------------+---------+-----------+----------+--------------+ SFJ      Full                                                        +---------+---------------+---------+-----------+----------+--------------+ FV Prox  Full                                                        +---------+---------------+---------+-----------+----------+--------------+ FV Mid   Full                                                        +---------+---------------+---------+-----------+----------+--------------+ FV DistalFull                                                        +---------+---------------+---------+-----------+----------+--------------+ PFV      Full                                                        +---------+---------------+---------+-----------+----------+--------------+ POP      Full           Yes      Yes                                  +---------+---------------+---------+-----------+----------+--------------+ PTV      Full                                                        +---------+---------------+---------+-----------+----------+--------------+ PERO     Full                                                        +---------+---------------+---------+-----------+----------+--------------+ Patient stated he's experiencing pain on anterior mid portion on left thigh. Upon scanning an isolated lymph node was observed. No images documented.    Summary: BILATERAL: - No evidence of deep vein thrombosis seen in the lower extremities, bilaterally. -No evidence of popliteal cyst, bilaterally.   *  See table(s) above for measurements and observations. Electronically signed by Heath Lark on 01/05/2023 at 6:03:20 PM.    Final    ECHOCARDIOGRAM COMPLETE  Result Date: 01/05/2023    ECHOCARDIOGRAM REPORT   Patient Name:   Larry Lane Date of Exam: 01/05/2023 Medical Rec #:  [id number]       Height:       66.0 in Accession #:    [id number]      Weight:       184.7 lb Date of Birth:  1940/05/26      BSA:          1.933 m Patient Age:    81 years        BP:           121/46 mmHg Patient Gender: M               HR:           76 bpm. Exam Location:  Inpatient Procedure: 2D Echo, Cardiac Doppler, Color Doppler and Intracardiac            Opacification Agent Indications:    Abnormal ECG 794.31/ R94.31                 Elevated Troponin  History:        Patient has no prior history of Echocardiogram examinations.                 Risk Factors:Hypertension, Diabetes and Dyslipidemia.  Sonographer:    Lucendia Herrlich RCS Referring Phys: [id number] BINAYA DAHAL IMPRESSIONS  1. Right ventricular systolic function is mildly reduced most notably at the mid RV free wall, hypokinetic segment, with hyperdynamic apex consistent with McConnell's sign. Consider ruling out PE. The right ventricular size is moderately enlarged. There  is normal pulmonary artery  systolic pressure. RVSP 24 mmHg.  2. Left ventricular ejection fraction, by estimation, is 70 to 75%. The left ventricle has hyperdynamic function, with flow acceleration at the basal septum without evidence of LVOT obstruction. The left ventricle has no regional wall motion abnormalities. Left ventricular diastolic parameters are indeterminate.  3. The mitral valve is normal in structure. Trivial mitral valve regurgitation. No evidence of mitral stenosis.  4. The aortic valve is tricuspid. There is mild calcification of the aortic valve. Aortic valve regurgitation is not visualized. Aortic valve sclerosis/calcification is present, without any evidence of aortic stenosis.  5. The inferior vena cava is normal in size with <50% respiratory variability, suggesting right atrial pressure of 8 mmHg. FINDINGS  Left Ventricle: Left ventricular ejection fraction, by estimation, is 70 to 75%. The left ventricle has hyperdynamic function. The left ventricle has no regional wall motion abnormalities. Definity contrast agent was given IV to delineate the left ventricular endocardial borders. The left ventricular internal cavity size was normal in size. There is no left ventricular hypertrophy. Left ventricular diastolic parameters are indeterminate. Right Ventricle: The right ventricular size is moderately enlarged. No increase in right ventricular wall thickness. Right ventricular systolic function is mildly reduced. There is normal pulmonary artery systolic pressure. The tricuspid regurgitant velocity is 2.01 m/s, and with an assumed right atrial pressure of 8 mmHg, the estimated right ventricular systolic pressure is 24.2 mmHg. Left Atrium: Left atrial size was normal in size. Right Atrium: Right atrial size was normal in size. Pericardium: There is no evidence of pericardial effusion. Mitral Valve: The mitral valve is normal in structure. There is mild calcification of the mitral valve leaflet(s).  Trivial mitral valve  regurgitation. No evidence of mitral valve stenosis. The mean mitral valve gradient is 3.2 mmHg. Tricuspid Valve: The tricuspid valve is normal in structure. Tricuspid valve regurgitation is trivial. No evidence of tricuspid stenosis. Aortic Valve: The aortic valve is tricuspid. There is mild calcification of the aortic valve. Aortic valve regurgitation is not visualized. Aortic valve sclerosis/calcification is present, without any evidence of aortic stenosis. Aortic valve mean gradient measures 7.0 mmHg. Aortic valve peak gradient measures 12.2 mmHg. Aortic valve area, by VTI measures 1.83 cm. Pulmonic Valve: The pulmonic valve was normal in structure. Pulmonic valve regurgitation is trivial. No evidence of pulmonic stenosis. Aorta: The aortic root is normal in size and structure. Ascending aorta measurements are within normal limits for age when indexed to body surface area. Venous: The inferior vena cava is normal in size with less than 50% respiratory variability, suggesting right atrial pressure of 8 mmHg. IAS/Shunts: No atrial level shunt detected by color flow Doppler.  LEFT VENTRICLE PLAX 2D LVIDd:         4.30 cm   Diastology LVIDs:         2.70 cm   LV e' medial:    9.48 cm/s LV PW:         1.00 cm   LV E/e' medial:  13.2 LV IVS:        0.70 cm   LV e' lateral:   9.48 cm/s LVOT diam:     2.10 cm   LV E/e' lateral: 13.2 LV SV:         61 LV SV Index:   31 LVOT Area:     3.46 cm  RIGHT VENTRICLE             IVC RV S prime:     19.60 cm/s  IVC diam: 1.70 cm TAPSE (M-mode): 3.3 cm LEFT ATRIUM             Index        RIGHT ATRIUM           Index LA diam:        4.60 cm 2.38 cm/m   RA Area:     12.90 cm LA Vol (A2C):   61.8 ml 31.94 ml/m  RA Volume:   26.10 ml  13.50 ml/m LA Vol (A4C):   68.3 ml 35.32 ml/m LA Biplane Vol: 63.7 ml 32.95 ml/m  AORTIC VALVE AV Area (Vmax):    1.94 cm AV Area (Vmean):   1.82 cm AV Area (VTI):     1.83 cm AV Vmax:           175.00 cm/s AV Vmean:          118.000 cm/s AV  VTI:            0.331 m AV Peak Grad:      12.2 mmHg AV Mean Grad:      7.0 mmHg LVOT Vmax:         98.17 cm/s LVOT Vmean:        62.167 cm/s LVOT VTI:          0.175 m LVOT/AV VTI ratio: 0.53  AORTA Ao Root diam: 3.80 cm Ao Asc diam:  3.70 cm MITRAL VALVE                TRICUSPID VALVE MV Area (PHT): 2.77 cm     TR Peak grad:   16.2 mmHg MV Mean grad:  3.2 mmHg  TR Vmax:        201.00 cm/s MV Decel Time: 274 msec MV E velocity: 125.50 cm/s  SHUNTS MV A velocity: 133.00 cm/s  Systemic VTI:  0.18 m MV E/A ratio:  0.94         Systemic Diam: 2.10 cm Weston Brass MD Electronically signed by Weston Brass MD Signature Date/Time: 01/05/2023/11:32:01 AM    Final    MR LUMBAR SPINE W WO CONTRAST  Result Date: 01/05/2023 CLINICAL DATA:  Low back pain.  Recent lumbar puncture. EXAM: MRI LUMBAR SPINE WITHOUT AND WITH CONTRAST TECHNIQUE: Multiplanar and multiecho pulse sequences of the lumbar spine were obtained without and with intravenous contrast. CONTRAST:  8mL GADAVIST GADOBUTROL 1 MMOL/ML IV SOLN COMPARISON:  None Available. FINDINGS: Segmentation:  Standard. Alignment:  Grade 1 anterolisthesis at L4-5 Vertebrae: No acute fracture or discitis-osteomyelitis. There is increased soft tissue surrounding the posterior elements at the L4-5 level, with close apposition of the L4 and L5 spinous processes and edema of the inter spinous ligament. Conus medullaris and cauda equina: Conus extends to the L1 level. There are small outpouchings of the ligamentum flavum at L3-4 and L4-5, which show mild contrast enhancement. No epidural collection. Paraspinal and other soft tissues: Fatty atrophy of the lower paraspinous muscles. Disc levels: L1-L2: Normal disc space and facet joints. No spinal canal stenosis. No neural foraminal stenosis. L2-L3: Normal disc space and facet joints. No spinal canal stenosis. No neural foraminal stenosis. L3-L4: Mild ligamentum flavum infolding. No spinal canal stenosis. No neural foraminal  stenosis. L4-L5: Moderate facet arthrosis with ligamentum flavum infolding. Mild spinal canal stenosis. Severe right and moderate left neural foraminal stenosis. L5-S1: Normal disc space and facet joints. No spinal canal stenosis. No neural foraminal stenosis. Visualized sacrum: Normal. IMPRESSION: 1. No discitis-osteomyelitis or epidural collection. 2. 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 3. Close approximation of the spinous processes at the L3-4 and L4-5 levels with edema of the interspinous ligament, most consistent with Baastrup disease. Electronically Signed   By: Deatra Robinson M.D.   On: 01/05/2023 01:43   [place] EKG SITE RITE  Result Date: 01/04/2023 If Site Rite image not attached, placement could not be confirmed due to current cardiac rhythm.  DG FL GUIDED LUMBAR PUNCTURE  Result Date: 01/04/2023 CLINICAL DATA:  82 year old male with encephalopathy, fevers. Lumbar puncture requested. EXAM: LUMBAR PUNCTURE UNDER FLUOROSCOPY PROCEDURE: An appropriate skin entry site was determined fluoroscopically. Operator donned sterile gloves and mask. Skin site was marked, then prepped with Betadine, draped in usual sterile fashion, and infiltrated locally with 1% lidocaine. A 20 gauge spinal needle advanced into the thecal sac at L3-L4 from a left interlaminar approach. Clear colorless CSF spontaneously returned, with opening pressure of 15 cm water. 12 ml CSF were collected and divided among 4 sterile vials for the requested laboratory studies. The needle was then removed. The patient tolerated the procedure well and there were no complications. FLUOROSCOPY: Radiation Exposure Index (as provided by the fluoroscopic device): 12.6 mGy Kerma IMPRESSION: Technically successful lumbar puncture under fluoroscopy. This exam was performed by Loyce Dys PA-C, and was supervised and interpreted by Leanna Battles, MD. Electronically Signed   By: Leanna Battles  M.D.   On: 01/04/2023 16:44   CT Angio Chest PE W/Cm &/Or Wo Cm  Result Date: 01/04/2023 CLINICAL DATA:  Pulmonary embolism suspected, high probability. Altered mental status, tachypnea. Sepsis. EXAM: CT ANGIOGRAPHY CHEST CT ABDOMEN AND PELVIS  WITH CONTRAST TECHNIQUE: Multidetector CT imaging of the chest was performed using the standard protocol during bolus administration of intravenous contrast. Multiplanar CT image reconstructions and MIPs were obtained to evaluate the vascular anatomy. Multidetector CT imaging of the abdomen and pelvis was performed using the standard protocol during bolus administration of intravenous contrast. RADIATION DOSE REDUCTION: This exam was performed according to the departmental dose-optimization program which includes automated exposure control, adjustment of the mA and/or kV according to patient size and/or use of iterative reconstruction technique. CONTRAST:  75mL OMNIPAQUE IOHEXOL 350 MG/ML SOLN COMPARISON:  05/21/2013. FINDINGS: CTA CHEST FINDINGS Cardiovascular: The heart is borderline enlarged and there is no pericardial effusion. Multi-vessel coronary artery calcifications are noted. There is atherosclerotic calcification of the aorta without evidence of aneurysm. Pulmonary trunk is normal in caliber. There are questionable pulmonary emboli in the left upper and lower lobes. Evaluation is limited due to mixing artifact and respiratory motion. The right ventricle is distended, not significantly changed from 2015, however possibility right heart strain can not be excluded. Mediastinum/Nodes: No enlarged mediastinal, hilar, or axillary lymph nodes. Thyroid gland, trachea, and esophagus demonstrate no significant findings. There is a small hiatal hernia. Lungs/Pleura: Mild atelectasis is noted bilaterally. No effusion or pneumothorax is seen. Musculoskeletal: Degenerative changes are present in the thoracic spine. No acute osseous abnormality is seen. Review of the MIP  images confirms the above findings. CT ABDOMEN and PELVIS FINDINGS Hepatobiliary: No focal liver abnormality is seen. No gallstones, gallbladder wall thickening, or biliary dilatation. Pancreas: Pancreatic atrophy is noted. No pancreatic ductal dilatation or surrounding inflammatory changes. Spleen: Normal in size without focal abnormality. Adrenals/Urinary Tract: The adrenal glands are within normal limits. The kidneys enhance symmetrically. Subcentimeter hypodensities are present in the kidneys bilaterally which are too small to further characterize. No renal calculus or obstructive uropathy. Bladder is unremarkable. Stomach/Bowel: There is a small hiatal hernia. The stomach is otherwise within normal limits. Segmental thickening of the walls of the terminal ileum is noted with multiple dilated loops of small bowel proximal to this segment abdomen measuring up to 4.1 cm. The appendix is not seen and surgical changes are noted at the cecum. No free air or pneumatosis is seen. Vascular/Lymphatic: Aortic atherosclerosis. No enlarged abdominal or pelvic lymph nodes. Reproductive: Prostate gland is mildly enlarged. Other: A small amount of free fluid is noted in the pelvis. Musculoskeletal: Degenerative changes are present in the lumbar spine. No acute osseous abnormality is seen. Review of the MIP images confirms the above findings. IMPRESSION: 1. Questionable left upper lobe and left lower lobe pulmonary emboli. Examination is limited due to mixing artifact and respiratory motion. The right ventricle is mildly distended, which is present on a previous exam, however the possibility of right heart strain can not be excluded. Consider repeat evaluation or V/Q scan for confirmation. 2. Segmental thickening of the walls of the terminal ileum, possible infectious or inflammatory ileitis with dilatation of the small bowel proximal to this segment, resulting in partial or early small bowel obstruction. 3. Small hiatal  hernia. 4. Coronary artery calcifications. 5. Aortic atherosclerosis. Electronically Signed   By: Thornell Sartorius M.D.   On: 01/04/2023 04:25   CT ABDOMEN PELVIS W CONTRAST  Result Date: 01/04/2023 CLINICAL DATA:  Pulmonary embolism suspected, high probability. Altered mental status, tachypnea. Sepsis. EXAM: CT ANGIOGRAPHY CHEST CT ABDOMEN AND PELVIS WITH CONTRAST TECHNIQUE: Multidetector CT imaging of the chest was performed using the standard protocol during bolus administration of intravenous contrast. Multiplanar CT image  reconstructions and MIPs were obtained to evaluate the vascular anatomy. Multidetector CT imaging of the abdomen and pelvis was performed using the standard protocol during bolus administration of intravenous contrast. RADIATION DOSE REDUCTION: This exam was performed according to the departmental dose-optimization program which includes automated exposure control, adjustment of the mA and/or kV according to patient size and/or use of iterative reconstruction technique. CONTRAST:  75mL OMNIPAQUE IOHEXOL 350 MG/ML SOLN COMPARISON:  05/21/2013. FINDINGS: CTA CHEST FINDINGS Cardiovascular: The heart is borderline enlarged and there is no pericardial effusion. Multi-vessel coronary artery calcifications are noted. There is atherosclerotic calcification of the aorta without evidence of aneurysm. Pulmonary trunk is normal in caliber. There are questionable pulmonary emboli in the left upper and lower lobes. Evaluation is limited due to mixing artifact and respiratory motion. The right ventricle is distended, not significantly changed from 2015, however possibility right heart strain can not be excluded. Mediastinum/Nodes: No enlarged mediastinal, hilar, or axillary lymph nodes. Thyroid gland, trachea, and esophagus demonstrate no significant findings. There is a small hiatal hernia. Lungs/Pleura: Mild atelectasis is noted bilaterally. No effusion or pneumothorax is seen. Musculoskeletal:  Degenerative changes are present in the thoracic spine. No acute osseous abnormality is seen. Review of the MIP images confirms the above findings. CT ABDOMEN and PELVIS FINDINGS Hepatobiliary: No focal liver abnormality is seen. No gallstones, gallbladder wall thickening, or biliary dilatation. Pancreas: Pancreatic atrophy is noted. No pancreatic ductal dilatation or surrounding inflammatory changes. Spleen: Normal in size without focal abnormality. Adrenals/Urinary Tract: The adrenal glands are within normal limits. The kidneys enhance symmetrically. Subcentimeter hypodensities are present in the kidneys bilaterally which are too small to further characterize. No renal calculus or obstructive uropathy. Bladder is unremarkable. Stomach/Bowel: There is a small hiatal hernia. The stomach is otherwise within normal limits. Segmental thickening of the walls of the terminal ileum is noted with multiple dilated loops of small bowel proximal to this segment abdomen measuring up to 4.1 cm. The appendix is not seen and surgical changes are noted at the cecum. No free air or pneumatosis is seen. Vascular/Lymphatic: Aortic atherosclerosis. No enlarged abdominal or pelvic lymph nodes. Reproductive: Prostate gland is mildly enlarged. Other: A small amount of free fluid is noted in the pelvis. Musculoskeletal: Degenerative changes are present in the lumbar spine. No acute osseous abnormality is seen. Review of the MIP images confirms the above findings. IMPRESSION: 1. Questionable left upper lobe and left lower lobe pulmonary emboli. Examination is limited due to mixing artifact and respiratory motion. The right ventricle is mildly distended, which is present on a previous exam, however the possibility of right heart strain can not be excluded. Consider repeat evaluation or V/Q scan for confirmation. 2. Segmental thickening of the walls of the terminal ileum, possible infectious or inflammatory ileitis with dilatation of the  small bowel proximal to this segment, resulting in partial or early small bowel obstruction. 3. Small hiatal hernia. 4. Coronary artery calcifications. 5. Aortic atherosclerosis. Electronically Signed   By: Thornell Sartorius M.D.   On: 01/04/2023 04:25   CT Head Wo Contrast  Result Date: 01/04/2023 CLINICAL DATA:  Altered mental status EXAM: CT HEAD WITHOUT CONTRAST TECHNIQUE: Contiguous axial images were obtained from the base of the skull through the vertex without intravenous contrast. RADIATION DOSE REDUCTION: This exam was performed according to the departmental dose-optimization program which includes automated exposure control, adjustment of the mA and/or kV according to patient size and/or use of iterative reconstruction technique. COMPARISON:  None Available. 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 No evidence of acute intracranial hemorrhage or infarct. No abnormal mass effect or midline shift. No abnormal intra or extra-axial mass lesion. Ventricular size is normal. Vascular: No hyperdense vessel or unexpected calcification. Skull: No acute fracture Sinuses/Orbits: Paranasal sinuses are clear. Right ocular lens has been removed. Orbits are otherwise unremarkable. Other: Mastoid air cells and middle ear cavities are clear. IMPRESSION: 1. No acute intracranial abnormality. 2. 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 Electronically Signed   By: Helyn Numbers M.D.   On: 01/04/2023 01:44   DG Chest Port 1 View  Result Date: 01/04/2023 CLINICAL DATA:  Syncope EXAM: PORTABLE CHEST 1 VIEW COMPARISON:  05/22/2013 FINDINGS: Lungs are clear.  No pleural effusion or pneumothorax. The heart is normal in size.  Thoracic aortic atherosclerosis. IMPRESSION: No acute cardiopulmonary disease. Electronically Signed   By: Charline Bills M.D.    On: 01/04/2023 01:25     Subjective: No acute issues or events overnight   Discharge Exam: Vitals:   01/30/23 0444 01/30/23 0821  BP: (!) 151/65 (!) 141/64  Pulse: 71 78  Resp: 18 17  Temp: 98.3 F (36.8 C) 98.2 F (36.8 C)  SpO2: 98% 97%   Vitals:   01/29/23 1451 01/29/23 2101 01/30/23 0444 01/30/23 0821  BP: (!) 152/70 (!) 154/73 (!) 151/65 (!) 141/64  Pulse: 65 63 71 78  Resp: 18 18 18 17   Temp: 98 F (36.7 C) 98.3 F (36.8 C) 98.3 F (36.8 C) 98.2 F (36.8 C)  TempSrc: Oral     SpO2: 100% 97% 98% 97%  Weight:      Height:        General: Pt is alert, awake, not in acute distress Cardiovascular: RRR, S1/S2 +, no rubs, no gallops Respiratory: CTA bilaterally, no wheezing, no rhonchi Abdominal: Soft, NT, ND, bowel sounds + Extremities: no edema, no cyanosis    The results of significant diagnostics from this hospitalization (including imaging, microbiology, ancillary and laboratory) are listed below for reference.     Microbiology: Recent Results (from the past 240 hour(s))  Blood culture (routine x 2)     Status: None (Preliminary result)   Collection Time: 01/29/23 10:01 AM   Specimen: BLOOD LEFT ARM  Result Value Ref Range Status   Specimen Description BLOOD LEFT ARM  Final   Special Requests   Final    BOTTLES DRAWN AEROBIC AND ANAEROBIC Blood Culture adequate volume   Culture   Final    NO GROWTH < 24 HOURS Performed at [facility], [address] [place], [place] [address]    Report Status PENDING  Incomplete  Blood culture (routine x 2)     Status: None (Preliminary result)   Collection Time: 01/29/23 10:02 AM   Specimen: BLOOD RIGHT ARM  Result Value Ref Range Status   Specimen Description BLOOD RIGHT ARM  Final   Special Requests   Final    BOTTLES DRAWN AEROBIC AND ANAEROBIC Blood Culture results may not be optimal due to an inadequate volume of blood received in culture bottles   Culture   Final    NO GROWTH < 24 HOURS Performed  at [facility], [address], [place], [place] [address]    Report Status PENDING  Incomplete     Labs: BNP (last 3 results) No results for input(s): "BNP" in the last 8760 hours.  Basic Metabolic Panel: Recent Labs  Lab 01/29/23 1001 01/30/23 0536  NA 129* 136  K 4.7 5.0  CL 96* 102  CO2 23 24  GLUCOSE 306* 197*  BUN 12 8  CREATININE 0.88 0.97  CALCIUM 8.5* 8.7*  MG  --  1.9  PHOS  --  3.7   Liver Function Tests: Recent Labs  Lab 01/30/23 0536  ALBUMIN 2.9*   No results for input(s): "LIPASE", "AMYLASE" in the last 168 hours. No results for input(s): "AMMONIA" in the last 168 hours. CBC: Recent Labs  Lab 01/29/23 1001 01/30/23 0536  WBC 9.9 6.3  HGB 11.2* 10.4*  HCT 34.7* 32.5*  MCV 94.8 93.4  PLT 334 294   Cardiac Enzymes: No results for input(s): "CKTOTAL", "CKMB", "CKMBINDEX", "TROPONINI" in the last 168 hours. BNP: Invalid input(s): "POCBNP" CBG: Recent Labs  Lab 01/29/23 1706 01/29/23 2108 01/30/23 0445 01/30/23 0725 01/30/23 1113  GLUCAP 128* 108* 192* 208* 190*   D-Dimer No results for input(s): "DDIMER" in the last 72 hours. Hgb A1c No results for input(s): "HGBA1C" in the last 72 hours. Lipid Profile No results for input(s): "CHOL", "HDL", "LDLCALC", "TRIG", "CHOLHDL", "LDLDIRECT" in the last 72 hours. Thyroid function studies Recent Labs    01/29/23 1155  TSH 1.426   Anemia work up No results for input(s): "VITAMINB12", "FOLATE", "FERRITIN", "TIBC", "IRON", "RETICCTPCT" in the last 72 hours. Urinalysis    Component Value Date/Time   COLORURINE YELLOW 01/29/2023 1001   APPEARANCEUR HAZY (A) 01/29/2023 1001   APPEARANCEUR Clear 05/21/2013 2121   LABSPEC 1.018 01/29/2023 1001   LABSPEC 1.031 05/21/2013 2121   PHURINE 6.0 01/29/2023 1001   GLUCOSEU >=500 (A) 01/29/2023 1001   GLUCOSEU 50 mg/dL 40/98/1191 4782   HGBUR NEGATIVE 01/29/2023 1001   BILIRUBINUR NEGATIVE 01/29/2023 1001   BILIRUBINUR Negative 05/21/2013 2121    KETONESUR NEGATIVE 01/29/2023 1001   PROTEINUR 30 (A) 01/29/2023 1001   NITRITE NEGATIVE 01/29/2023 1001   LEUKOCYTESUR NEGATIVE 01/29/2023 1001   LEUKOCYTESUR Negative 05/21/2013 2121   Sepsis Labs Recent Labs  Lab 01/29/23 1001 01/30/23 0536  WBC 9.9 6.3   Microbiology Recent Results (from the past 240 hour(s))  Blood culture (routine x 2)     Status: None (Preliminary result)   Collection Time: 01/29/23 10:01 AM   Specimen: BLOOD LEFT ARM  Result Value Ref Range Status   Specimen Description BLOOD LEFT ARM  Final   Special Requests   Final    BOTTLES DRAWN AEROBIC AND ANAEROBIC Blood Culture adequate volume   Culture   Final    NO GROWTH < 24 HOURS Performed at [facility], [address], [place], [place] [address]    Report Status PENDING  Incomplete  Blood culture (routine x 2)     Status: None (Preliminary result)   Collection Time: 01/29/23 10:02 AM   Specimen: BLOOD RIGHT ARM  Result Value Ref Range Status   Specimen Description BLOOD RIGHT ARM  Final   Special Requests   Final    BOTTLES DRAWN AEROBIC AND ANAEROBIC Blood Culture results may not be optimal due to an inadequate volume of blood received in culture bottles   Culture   Final    NO GROWTH < 24 HOURS Performed at [facility], [address], [place], [place] [address]    Report Status PENDING  Incomplete     Time coordinating discharge: Over 30 minutes  SIGNED:   Azucena Fallen, DO [facility] 01/30/2023,  11:58 AM Pager   If 7PM-7AM, please contact night-coverage [email or web address]

## 2023-01-30 NOTE — Progress Notes (Signed)
Cardiologist:  Tenny Craw  Subjective:  Denies SSCP, palpitations or Dyspnea Wants to go home   Objective:  Vitals:   01/29/23 1450 01/29/23 1451 01/29/23 2101 01/30/23 0444  BP:  (!) 152/70 (!) 154/73 (!) 151/65  Pulse:  65 63 71  Resp:  18 18 18   Temp:  98 F (36.7 C) 98.3 F (36.8 C) 98.3 F (36.8 C)  TempSrc:  Oral    SpO2:  100% 97% 98%  Weight: 77.6 kg     Height: 5\' 6"  (1.676 m)       Intake/Output from previous day:  Intake/Output Summary (Last 24 hours) at 01/30/2023 0810 Last data filed at 01/30/2023 9604 Gross per 24 hour  Intake 992.2 ml  Output 0 ml  Net 992.2 ml    Physical Exam: Affect appropriate Healthy:  appears stated age HEENT: normal Neck supple with no adenopathy JVP normal no bruits no thyromegaly Lungs clear with no wheezing and good diaphragmatic motion Heart:  S1/S2 no murmur, no rub, gallop or click PMI normal Abdomen: benighn, BS positve, no tenderness, no AAA no bruit.  No HSM or HJR Distal pulses intact with no bruits No edema Neuro non-focal Skin warm and dry No muscular weakness   Lab Results: Basic Metabolic Panel: Recent Labs    01/29/23 1001 01/30/23 0536  NA 129* 136  K 4.7 5.0  CL 96* 102  CO2 23 24  GLUCOSE 306* 197*  BUN 12 8  CREATININE 0.88 0.97  CALCIUM 8.5* 8.7*  MG  --  1.9  PHOS  --  3.7   Liver Function Tests: Recent Labs    01/30/23 0536  ALBUMIN 2.9*   No results for input(s): "LIPASE", "AMYLASE" in the last 72 hours. CBC: Recent Labs    01/29/23 1001 01/30/23 0536  WBC 9.9 6.3  HGB 11.2* 10.4*  HCT 34.7* 32.5*  MCV 94.8 93.4  PLT 334 294   Cardiac Enzymes: No results for input(s): "CKTOTAL", "CKMB", "CKMBINDEX", "TROPONINI" in the last 72 hours. BNP: Invalid input(s): "POCBNP" D-Dimer: No results for input(s): "DDIMER" in the last 72 hours. Hemoglobin A1C: No results for input(s): "HGBA1C" in the last 72 hours. Fasting Lipid Panel: No results for input(s): "CHOL", "HDL",  "LDLCALC", "TRIG", "CHOLHDL", "LDLDIRECT" in the last 72 hours. Thyroid Function Tests: Recent Labs    01/29/23 1155  TSH 1.426   Anemia Panel: No results for input(s): "VITAMINB12", "FOLATE", "FERRITIN", "TIBC", "IRON", "RETICCTPCT" in the last 72 hours.  Imaging: ECHOCARDIOGRAM LIMITED  Result Date: 01/29/2023    ECHOCARDIOGRAM LIMITED REPORT   Patient Name:   Larry Lane Date of Exam: 01/29/2023 Medical Rec #:  [id number]       Height:       66.0 in Accession #:    [id number]      Weight:       171.0 lb Date of Birth:  [birth year]      BSA:          1.871 m Patient Age:    82 years        BP:           152/70 mmHg Patient Gender: M               HR:           63 bpm. Exam Location:  Inpatient Procedure: Limited Echo, Color Doppler and Cardiac Doppler Indications:    dyspnea  History:        Patient has prior history  of Echocardiogram examinations, most                 recent 01/17/2023. Risk Factors:Dyslipidemia and Diabetes.  Sonographer:    Delcie Roch RDCS Referring Phys: [id number] Ronnald Ramp O'NEAL IMPRESSIONS  1. Left ventricular ejection fraction, by estimation, is 60 to 65%. The left ventricle has normal function. The left ventricle has no regional wall motion abnormalities. Left ventricular diastolic parameters are consistent with Grade I diastolic dysfunction (impaired relaxation).  2. Right ventricular systolic function is normal. The right ventricular size is mildly enlarged.  3. Redundant chordal tissue noted without evidence of LVOT obstruction. The mitral valve is grossly normal. Trivial mitral valve regurgitation. No evidence of mitral stenosis.  4. The aortic valve is tricuspid. Aortic valve regurgitation is not visualized. Aortic valve sclerosis is present, with no evidence of aortic valve stenosis.  5. The inferior vena cava is normal in size with greater than 50% respiratory variability, suggesting right atrial pressure of 3 mmHg. Comparison(s): Changes from prior study  are noted. RV appears mildly dilated, but RV function is normal. FINDINGS  Left Ventricle: Left ventricular ejection fraction, by estimation, is 60 to 65%. The left ventricle has normal function. The left ventricle has no regional wall motion abnormalities. The left ventricular internal cavity size was normal in size. There is  no left ventricular hypertrophy. Left ventricular diastolic parameters are consistent with Grade I diastolic dysfunction (impaired relaxation). Right Ventricle: The right ventricular size is mildly enlarged. No increase in right ventricular wall thickness. Right ventricular systolic function is normal. Left Atrium: Left atrial size was normal in size. Right Atrium: Right atrial size was normal in size. Pericardium: There is no evidence of pericardial effusion. Mitral Valve: Redundant chordal tissue noted without evidence of LVOT obstruction. The mitral valve is grossly normal. Trivial mitral valve regurgitation. No evidence of mitral valve stenosis. Tricuspid Valve: The tricuspid valve is grossly normal. Tricuspid valve regurgitation is trivial. No evidence of tricuspid stenosis. Aortic Valve: The aortic valve is tricuspid. Aortic valve regurgitation is not visualized. Aortic valve sclerosis is present, with no evidence of aortic valve stenosis. Pulmonic Valve: The pulmonic valve was grossly normal. Pulmonic valve regurgitation is not visualized. Aorta: The aortic root and ascending aorta are structurally normal, with no evidence of dilitation. Venous: The inferior vena cava is normal in size with greater than 50% respiratory variability, suggesting right atrial pressure of 3 mmHg. Additional Comments: Spectral Doppler performed. Color Doppler performed.  LEFT VENTRICLE PLAX 2D LVIDd:         3.60 cm LVIDs:         2.50 cm LV PW:         0.90 cm LV IVS:        1.10 cm  IVC IVC diam: 1.70 cm AORTIC VALVE LVOT Vmax:   113.00 cm/s LVOT Vmean:  76.400 cm/s LVOT VTI:    0.295 m  AORTA Ao Asc diam:  3.40 cm MITRAL VALVE MV Area (PHT): 2.37 cm     SHUNTS MV Decel Time: 320 msec     Systemic VTI: 0.30 m MV E velocity: 110.00 cm/s MV A velocity: 161.00 cm/s MV E/A ratio:  0.68 Lennie Odor MD Electronically signed by Lennie Odor MD Signature Date/Time: 01/29/2023/4:40:13 PM    Final    DG Chest Port 1 View  Result Date: 01/29/2023 CLINICAL DATA:  Syncope. EXAM: PORTABLE CHEST 1 VIEW COMPARISON:  01/04/2023 FINDINGS: The heart size and mediastinal contours are within normal limits. Mild  bibasilar atelectasis. There is no evidence of pulmonary edema, consolidation, pneumothorax or pleural fluid. The visualized skeletal structures are unremarkable. IMPRESSION: Mild bibasilar atelectasis. Electronically Signed   By: Irish Lack M.D.   On: 01/29/2023 10:58    Cardiac Studies:  ECG: SR normal    Telemetry: NRR  Echo: 01/29/23 EF 60-65% Normal RV function  Medications:    amLODipine  5 mg Oral Daily   carbamazepine  200 mg Oral Q6H   cholestyramine light  4 g Oral BID WC   gabapentin  600 mg Oral Q8H   insulin aspart  0-15 Units Subcutaneous TID WC   insulin aspart  0-5 Units Subcutaneous QHS   losartan  25 mg Oral Daily   multivitamin with minerals  1 tablet Oral Daily   pantoprazole  40 mg Oral Daily   rosuvastatin  10 mg Oral q1800      Assessment/Plan:   Pre syncope:  does not appear to be cardiac in nature. R/O ECG normal TTE with normal LV/RV function will arrange outpatient f/u with Dr Tenny Craw PE:  ? Diagnosis decision made by Hosp/CCM to not anticoagulate as f/u CTA with no PE. RV function now normal on TTE per primary service HtN:  continue Norvasc and ARB HLD  on statin   Ok to d/c from cardiology perspective  Charlton Haws 01/30/2023, 8:10 AM

## 2023-01-30 NOTE — Care Management Obs Status (Signed)
MEDICARE OBSERVATION STATUS NOTIFICATION   Patient Details  Name: Larry Lane MRN: [id number] Date of Birth: October 26, 1940   Medicare Observation Status Notification Given:  Yes    Tom-Johnson, Hershal Coria, RN 01/30/2023, 10:03 AM

## 2023-01-30 NOTE — TOC Transition Note (Signed)
Transition of Care [facility]) - CM/SW Discharge Note   Patient Details  Name: Larry Lane MRN: [id number] Date of Birth: October 30, 1940  [facility]) CM/SW Contact:  Tom-Johnson, Hershal Coria, RN Phone Number: 01/30/2023, 1:26 PM   Clinical Narrative:     Patient is scheduled for discharge today.  Readmission Risk Assessment done. Home health info, hospital f/u and discharge instructions on AVS. Wife, Peggy to transport at discharge.  No further TOC needs noted.          Final next level of care: Home w Home Health Services Barriers to Discharge: Barriers Resolved   Patient Goals and CMS Choice CMS Medicare.gov Compare Post Acute Care list provided to:: Patient Choice offered to / list presented to : Patient, Spouse  Discharge Placement                  Patient to be transferred to facility by: Wife Name of family member notified: Peggy    Discharge Plan and Services Additional resources added to the After Visit Summary for                  DME Arranged: N/A DME Agency: NA       HH Arranged: PT HH Agency: [facility] Date [facility] Agency Contacted: 01/30/23 Time HH Agency Contacted: 1320 Representative spoke with at [facility] Agency: Kandee Keen  Social Determinants of Health (SDOH) Interventions SDOH Screenings   Food Insecurity: No Food Insecurity (01/29/2023)  Housing: Low Risk  (01/29/2023)  Transportation Needs: No Transportation Needs (01/29/2023)  Utilities: Not At Risk (01/29/2023)  Financial Resource Strain: Patient Declined (11/16/2022)   Received from [facility]  Tobacco Use: Low Risk  (01/07/2023)     Readmission Risk Interventions     No data to display

## 2023-02-03 LAB — CULTURE, BLOOD (ROUTINE X 2)
Culture: NO GROWTH
Culture: NO GROWTH
Special Requests: ADEQUATE

## 2023-02-16 ENCOUNTER — Ambulatory Visit: Payer: Medicare Other | Admitting: Nurse Practitioner

## 2023-02-24 ENCOUNTER — Other Ambulatory Visit: Payer: Self-pay | Admitting: Physician Assistant

## 2023-02-27 ENCOUNTER — Other Ambulatory Visit: Payer: Self-pay

## 2023-02-27 ENCOUNTER — Emergency Department (HOSPITAL_COMMUNITY)
Admission: EM | Admit: 2023-02-27 | Discharge: 2023-02-27 | Disposition: A | Discharge status: Home / Self Care | Payer: Medicare Other | Attending: Emergency Medicine | Admitting: Emergency Medicine

## 2023-02-27 ENCOUNTER — Encounter (HOSPITAL_COMMUNITY): Payer: Self-pay

## 2023-02-27 DIAGNOSIS — Z85828 Personal history of other malignant neoplasm of skin: Secondary | ICD-10-CM | POA: Insufficient documentation

## 2023-02-27 DIAGNOSIS — R7989 Other specified abnormal findings of blood chemistry: Secondary | ICD-10-CM | POA: Diagnosis not present

## 2023-02-27 DIAGNOSIS — E119 Type 2 diabetes mellitus without complications: Secondary | ICD-10-CM | POA: Insufficient documentation

## 2023-02-27 DIAGNOSIS — I10 Essential (primary) hypertension: Secondary | ICD-10-CM | POA: Diagnosis not present

## 2023-02-27 DIAGNOSIS — R55 Syncope and collapse: Secondary | ICD-10-CM | POA: Insufficient documentation

## 2023-02-27 LAB — CBC
HCT: 38.9 % — ABNORMAL LOW (ref 39.0–52.0)
Hemoglobin: 13.1 g/dL (ref 13.0–17.0)
MCH: 31 pg (ref 26.0–34.0)
MCHC: 33.7 g/dL (ref 30.0–36.0)
MCV: 92 fL (ref 80.0–100.0)
Platelets: 331 10*3/uL (ref 150–400)
RBC: 4.23 MIL/uL (ref 4.22–5.81)
RDW: 12 % (ref 11.5–15.5)
WBC: 8.5 10*3/uL (ref 4.0–10.5)
nRBC: 0 % (ref 0.0–0.2)

## 2023-02-27 LAB — LIPASE, BLOOD: Lipase: 21 U/L (ref 11–51)

## 2023-02-27 LAB — BASIC METABOLIC PANEL
Anion gap: 11 (ref 5–15)
BUN: 10 mg/dL (ref 8–23)
CO2: 22 mmol/L (ref 22–32)
Calcium: 8.9 mg/dL (ref 8.9–10.3)
Chloride: 95 mmol/L — ABNORMAL LOW (ref 98–111)
Creatinine, Ser: 0.95 mg/dL (ref 0.61–1.24)
GFR, Estimated: >60 mL/min (ref >60)
Glucose, Bld: 275 mg/dL — ABNORMAL HIGH (ref 70–99)
Potassium: 3.8 mmol/L (ref 3.5–5.1)
Sodium: 128 mmol/L — ABNORMAL LOW (ref 135–145)

## 2023-02-27 LAB — HEPATIC FUNCTION PANEL
ALT: 25 U/L (ref 0–44)
AST: 32 U/L (ref 15–41)
Albumin: 3.2 g/dL — ABNORMAL LOW (ref 3.5–5.0)
Alkaline Phosphatase: 58 U/L (ref 38–126)
Bilirubin, Direct: 0.3 mg/dL — ABNORMAL HIGH (ref 0.0–0.2)
Indirect Bilirubin: 0.3 mg/dL (ref 0.3–0.9)
Total Bilirubin: 0.6 mg/dL (ref 0.0–1.2)
Total Protein: 5.9 g/dL — ABNORMAL LOW (ref 6.5–8.1)

## 2023-02-27 LAB — TROPONIN I (HIGH SENSITIVITY)
Troponin I (High Sensitivity): 5 ng/L (ref <18)
Troponin I (High Sensitivity): 4 ng/L (ref <18)

## 2023-02-27 LAB — CBG MONITORING, ED: Glucose-Capillary: 262 mg/dL — ABNORMAL HIGH (ref 70–99)

## 2023-02-27 MED ORDER — SODIUM CHLORIDE 0.9 % IV BOLUS
500.0000 mL | Freq: Once | INTRAVENOUS | Status: AC
Start: 2023-02-27 — End: 2023-02-27
  Administered 2023-02-27: 500 mL via INTRAVENOUS

## 2023-02-27 NOTE — ED Provider Triage Note (Signed)
Emergency Medicine Provider Triage Evaluation Note  Larry Lane , a 82 y.o. male  was evaluated in triage.  Pt complains of lightheadedness with syncope this morning.  Patient denies any chest pain or significant abdominal discomfort.  No sudden/severe headache.  He has had syncope issues in the past.  EMS arrived on scene to find him initially hypotensive and hyperglycemic but symptoms improved with IV fluid and route.  He did report some chest discomfort to triage nurse but when I ask about chest pain he denies this.  Review of Systems  Positive: Syncope with ? CP Negative: SOB or abdominal pain  Physical Exam  BP (!) 154/75 (BP Location: Left Arm)   Pulse 61   Temp 98 F (36.7 C) (Oral)   Resp 18   SpO2 100%  Gen:   Awake, no distress   Resp:  Normal effort  MSK:   Moves extremities without difficulty  Other:    Medical Decision Making  Medically screening exam initiated at 11:13 AM.  Appropriate orders placed.  Collie Siad was informed that the remainder of the evaluation will be completed by another provider, this initial triage assessment does not replace that evaluation, and the importance of remaining in the ED until their evaluation is complete.   EKG Interpretation Date/Time:  Monday February 27 2023 10:34:14 EST Ventricular Rate:  63 PR Interval:  172 QRS Duration:  86 QT Interval:  406 QTC Calculation: 415 R Axis:   38  Text Interpretation: Normal sinus rhythm Normal ECG When compared with ECG of 29-Jan-2023 12:42, PREVIOUS ECG IS PRESENT Similar to prior tracings Confirmed by Alona Bene [phone]) on 02/27/2023 11:12:59 AM          Gabrial Poppell, Arlyss Repress, MD 02/27/23 1114

## 2023-02-27 NOTE — Inpatient Diabetes Management (Addendum)
Inpatient Diabetes Program Recommendations  AACE/ADA: New Consensus Statement on Inpatient Glycemic Control (2015)  Target Ranges:  Prepandial:   less than 140 mg/dL      Peak postprandial:   less than 180 mg/dL (1-2 hours)      Critically ill patients:  140 - 180 mg/dL   Lab Results  Component Value Date   GLUCAP 262 (H) 02/27/2023   HGBA1C 7.6 (H) 01/04/2023    Review of Glycemic Control  Latest Reference Range & Units 02/27/23 10:29  Glucose-Capillary 70 - 99 mg/dL 161 (H)  (H): Data is abnormally high  Diabetes history: DM2 Outpatient Diabetes medications: V-Go 20 insulin pump and Freestyle Libre 2 with reader Current orders for Inpatient glycemic control: None  Inpatient Diabetes Program Recommendations:    If patient is to be admitted please discontinue the V-Go insulin pump this evening and administer:  Semglee 15 units at bedtime Novolog 0-9 units TID and 0-5 units at bedtime  Spoke with spouse on the phone.  He wears the 24 hour pump patch Vi-Go 20.  This patch gives him 20 units of basal insulin throughout the day using rapid insulin and he can bolus up to 36 units/day for correction and or meals.  He also uses the [employer] 2 CGM.  He has been worried about going low in the middle of the night and has been taking his pump off at bedtime.  His fasting CBGs have been around 170 mg/dL.  Discussed alarms on the FSL 2 and spouse says he is hard of hearing and does not hear the alarms when sleeping which is why he has been removing is pump at bedtime.    Will continue to follow while inpatient.  Thank you, Dulce Sellar, MSN, CDCES Diabetes Coordinator Inpatient Diabetes Program [phone] (team pager from 8a-5p)

## 2023-02-27 NOTE — Discharge Instructions (Signed)
You were seen in the emergency department today with passing out.  Your lab work here is reassuring.  I would like for you to keep your appointment with the cardiology doctor later this month.  Return with any new or suddenly worsening symptoms.

## 2023-02-27 NOTE — ED Triage Notes (Addendum)
From home, complained of low bp 92/50 per ems. Complained of weakness and dizziness since this am. Denies chest pain. Pt reports chronic diarrhea. Pt was here one month ago with septic shock. CBG 316. Initial BP 80/41 at home. Pt reports chest pain to center of chest and non-radiating last night but now resolved. Pt complained of headache that was just starting per EMS.

## 2023-02-27 NOTE — ED Triage Notes (Signed)
Patient complains of chills with weakness and syncopal episode. On assessment alert and oriented. Denis pain

## 2023-02-27 NOTE — ED Provider Notes (Incomplete)
Emergency Department Provider Note   I have reviewed the triage vital signs and the nursing notes.   HISTORY  Chief Complaint No chief complaint on file.   HPI Larry Lane is a 82 y.o. male with history of diabetes, hypertension, recurrent syncope presents to the emergency department with syncope episode this morning.  Patient states that he had some mild chest discomfort/tightness last night but nothing severe.  He was able to get to sleep without difficulty.  He woke up and ate breakfast feeling well, took his medicines, went about his morning.  His wife states that he began to seem like he was going to pass out and she called for family to help.  He had a brief episode of syncope which self resolved.  EMS arrived to find the patient with initial blood pressure in the 80s but improved with IV fluids.  Patient states that since waking up he is feeling well.  No fever or shortness of breath.  Past Medical History:  Diagnosis Date   Abscess of back    Arthritis    Bacterial infection 2015   Cancer (HCC)    skin    Diabetes mellitus without complication (HCC)    Dysrhythmia    Edema    RIGHT ANKLE   GERD (gastroesophageal reflux disease)    Hypertension    Trigeminal nerve disorder    Trigeminal neuralgia     Review of Systems  Constitutional: No fever/chills Cardiovascular: Denies chest pain. Positive syncope. Respiratory: Denies shortness of breath. Gastrointestinal: No abdominal pain.  No nausea, no vomiting.  No diarrhea.  No constipation. Genitourinary: Negative for dysuria. Musculoskeletal: Negative for back pain. Skin: Negative for rash. Neurological: Negative for headaches.  ____________________________________________   PHYSICAL EXAM:  VITAL SIGNS: ED Triage Vitals  Encounter Vitals Group     BP 02/27/23 1014 (!) 154/75     Pulse Rate 02/27/23 1014 61     Resp 02/27/23 1014 18     Temp 02/27/23 1014 98 F (36.7 C)     Temp Source 02/27/23 1014  Oral     SpO2 02/27/23 1014 100 %   Constitutional: Alert and oriented. Well appearing and in no acute distress. Eyes: Conjunctivae are normal.  Head: Atraumatic. Nose: No congestion/rhinnorhea. Mouth/Throat: Mucous membranes are moist.  Neck: No stridor. Cardiovascular: Normal rate, regular rhythm. Good peripheral circulation. Grossly normal heart sounds.   Respiratory: Normal respiratory effort.  No retractions. Lungs CTAB. Gastrointestinal: Soft and nontender. No distention.  Musculoskeletal: No gross deformities of extremities. Neurologic:  Normal speech and language. No gross focal neurologic deficits are appreciated.  Skin:  Skin is warm, dry and intact. No rash noted.  ____________________________________________   LABS (all labs ordered are listed, but only abnormal results are displayed)  Labs Reviewed  BASIC METABOLIC PANEL - Abnormal; Notable for the following components:      Result Value   Sodium 128 (*)    Chloride 95 (*)    Glucose, Bld 275 (*)    All other components within normal limits  CBC - Abnormal; Notable for the following components:   HCT 38.9 (*)    All other components within normal limits  CBG MONITORING, ED - Abnormal; Notable for the following components:   Glucose-Capillary 262 (*)    All other components within normal limits  URINALYSIS, ROUTINE W REFLEX MICROSCOPIC  HEPATIC FUNCTION PANEL  LIPASE, BLOOD  TROPONIN I (HIGH SENSITIVITY)  TROPONIN I (HIGH SENSITIVITY)   ____________________________________________  EKG  EKG Interpretation Date/Time:  Monday February 27 2023 10:34:14 EST Ventricular Rate:  63 PR Interval:  172 QRS Duration:  86 QT Interval:  406 QTC Calculation: 415 R Axis:   38  Text Interpretation: Normal sinus rhythm Normal ECG When compared with ECG of 29-Jan-2023 12:42, PREVIOUS ECG IS PRESENT Similar to prior tracings Confirmed by Alona Bene [phone]) on 02/27/2023 11:12:59 AM         ____________________________________________  RADIOLOGY  No results found.  ____________________________________________   PROCEDURES  Procedure(s) performed:   Procedures   ____________________________________________   INITIAL IMPRESSION / ASSESSMENT AND PLAN / ED COURSE  Pertinent labs & imaging results that were available during my care of the patient were reviewed by me and considered in my medical decision making (see chart for details).   This patient is Presenting for Evaluation of syncope, which does require a range of treatment options, and is a complaint that involves a high risk of morbidity and mortality.  The Differential Diagnoses includes but is not exclusive to acute coronary syndrome, aortic dissection, pulmonary embolism, cardiac tamponade, community-acquired pneumonia, pericarditis, musculoskeletal chest wall pain, etc.   Critical Interventions-    Medications  sodium chloride 0.9 % bolus 500 mL (500 mLs Intravenous Bolus 02/27/23 1254)    Reassessment after intervention: symptoms improved.    I did obtain Additional Historical Information from wife at bedside.   I decided to review pertinent External Data, and in summary with multiple recent admissions for syncope evaluation including most recently earlier this month with echo at that time showing resolved right ventricle dilation. Follow up with Cardiology on January 19th.    Clinical Laboratory Tests Ordered, included pseudohyponatremia of 128 that corrects to 132 after considering patient's blood sugar.  LFTs and bilirubin normal.  CBC without anemia or leukocytosis. No DKA.   Cardiac Monitor Tracing which shows NSR.    Social Determinants of Health Risk patient is a non-smoker.   Consult complete with  Medical Decision Making: Summary:  Patient presents emergency department with recurrent syncope.  No clear cardiogenic etiology on initial history, EKG, telemetry.  Plan for ED  observation, screening blood work, reassess.  Reevaluation with update and discussion with   ***Considered admission***  Patient's presentation is most consistent with acute presentation with potential threat to life or bodily function.   Disposition:   ____________________________________________  FINAL CLINICAL IMPRESSION(S) / ED DIAGNOSES  Final diagnoses:  None     NEW OUTPATIENT MEDICATIONS STARTED DURING THIS VISIT:  New Prescriptions   No medications on file    Note:  This document was prepared using Dragon voice recognition software and may include unintentional dictation errors.  Alona Bene, MD, [facility]

## 2023-03-09 ENCOUNTER — Ambulatory Visit: Payer: Medicare Other | Admitting: Gastroenterology

## 2023-03-11 ENCOUNTER — Encounter: Payer: Self-pay | Admitting: Cardiovascular Disease

## 2023-03-14 ENCOUNTER — Encounter (HOSPITAL_COMMUNITY): Payer: Self-pay

## 2023-03-16 ENCOUNTER — Ambulatory Visit
Admission: RE | Admit: 2023-03-16 | Discharge: 2023-03-16 | Disposition: A | Discharge status: Home / Self Care | Payer: Medicare Other | Source: Ambulatory Visit | Attending: Cardiology | Admitting: Cardiology

## 2023-03-16 ENCOUNTER — Ambulatory Visit: Payer: Medicare Other | Attending: Cardiovascular Disease | Admitting: Cardiovascular Disease

## 2023-03-16 ENCOUNTER — Encounter: Payer: Self-pay | Admitting: Cardiovascular Disease

## 2023-03-16 VITALS — BP 130/60 | HR 82 | Ht 65.5 in | Wt 183.0 lb

## 2023-03-16 DIAGNOSIS — I159 Secondary hypertension, unspecified: Secondary | ICD-10-CM

## 2023-03-16 DIAGNOSIS — E785 Hyperlipidemia, unspecified: Secondary | ICD-10-CM | POA: Diagnosis not present

## 2023-03-16 DIAGNOSIS — I2699 Other pulmonary embolism without acute cor pulmonale: Secondary | ICD-10-CM | POA: Diagnosis not present

## 2023-03-16 DIAGNOSIS — I700 Atherosclerosis of aorta: Secondary | ICD-10-CM | POA: Diagnosis not present

## 2023-03-16 DIAGNOSIS — I2510 Atherosclerotic heart disease of native coronary artery without angina pectoris: Secondary | ICD-10-CM | POA: Diagnosis present

## 2023-03-16 LAB — NM PET CT CARDIAC PERFUSION MULTI W/ABSOLUTE BLOODFLOW
LV dias vol: 79 mL (ref 62–150)
LV sys vol: 25 mL
MBFR: 1.96
Nuc Rest EF: 61 %
Nuc Stress EF: 68 %
Peak HR: 94 {beats}/min
Rest HR: 71 {beats}/min
Rest MBF: 1.05 ml/g/min
Rest Nuclear Isotope Dose: 20 mCi
SRS: 0
SSS: 0
ST Depression (mm): 0 mm
Stress MBF: 2.06 ml/g/min
Stress Nuclear Isotope Dose: 20.1 mCi
TID: 0.93

## 2023-03-16 MED ORDER — RUBIDIUM RB82 GENERATOR (RUBYFILL)
25.0000 | PACK | Freq: Once | INTRAVENOUS | Status: AC
  Administered 2023-03-16: 20.14 via INTRAVENOUS

## 2023-03-16 MED ORDER — REGADENOSON 0.4 MG/5ML IV SOLN
0.4000 mg | Freq: Once | INTRAVENOUS | Status: AC
  Administered 2023-03-16: 0.4 mg via INTRAVENOUS
  Filled 2023-03-16: qty 5

## 2023-03-16 MED ORDER — ASPIRIN 81 MG PO TBEC: 81.0000 mg | DELAYED_RELEASE_TABLET | Freq: Every day | ORAL | Status: AC

## 2023-03-16 MED ORDER — RUBIDIUM RB82 GENERATOR (RUBYFILL)
25.0000 | PACK | Freq: Once | INTRAVENOUS | Status: AC
  Administered 2023-03-16: 20 via INTRAVENOUS

## 2023-03-16 MED ORDER — REGADENOSON 0.4 MG/5ML IV SOLN
INTRAVENOUS | Status: AC
  Filled 2023-03-16: qty 5

## 2023-03-16 NOTE — Patient Instructions (Signed)
Medication Instructions:  START Aspirin 81 mg once daily  *If you need a refill on your cardiac medications before your next appointment, please call your pharmacy*   Lab Work: None ordered If you have labs (blood work) drawn today and your tests are completely normal, you will receive your results only by: MyChart Message (if you have MyChart) OR A paper copy in the mail If you have any lab test that is abnormal or we need to change your treatment, we will call you to review the results.   Testing/Procedures: None ordered   Follow-Up: At [facility], you and your health needs are our priority.  As part of our continuing mission to provide you with exceptional heart care, we have created designated Provider Care Teams.  These Care Teams include your primary Cardiologist (physician) and Advanced Practice Providers (APPs -  Physician Assistants and Nurse Practitioners) who all work together to provide you with the care you need, when you need it.  We recommend signing up for the patient portal called "MyChart".  Sign up information is provided on this After Visit Summary.  MyChart is used to connect with patients for Virtual Visits (Telemedicine).  Patients are able to view lab/test results, encounter notes, upcoming appointments, etc.  Non-urgent messages can be sent to your provider as well.   To learn more about what you can do with MyChart, go to [email or web address].    Your next appointment:   3 month(s)  Provider:   Nicolasa Ducking, NP Eula Listen, PA-C Cadence Fransico Michael, PA-C Charlsie Quest, NP Carlos Levering, NP

## 2023-03-16 NOTE — Progress Notes (Signed)
Cardiology Office Note   Date:  03/16/2023   ID:  Larry Lane, DOB 08-02-1940, MRN [id number]  PCP:  Larry Mina, MD  Cardiologist:   Lorine Bears, MD   Chief Complaint  Patient presents with   Follow-up    Post hosp. NSTEMI no complaints today. Meds reviewed verbally with pt.      History of Present Illness: Larry Lane is a 83 y.o. male who presents for a follow-up visit regarding elevated troponin and syncope. He has known history of essential hypertension, hyperlipidemia, PVCs and diabetes mellitus. He was hospitalized at [facility] in November with sepsis  and elevated troponin to the 3000 range..  Echo showed normal LV systolic function but dilated RV with suspicion for pulmonary embolism.  CTA was poor quality but there was enough suspicion for pulmonary embolism.  He was started on anticoagulation.  CTA was repeated and showed no evidence of pulmonary embolism but extensive coronary artery calcifications.  Anticoagulation was discontinued.  Repeat echocardiogram on December 1 showed normal LV systolic function with only mildly dilated right ventricle with normal systolic function. He was rehospitalized in early December with presyncope.  Troponin was negative.  He reports inability to walk since he had lumbar puncture in the hospital with persistent left leg weakness.  He is able to walk with a walker but uses a wheelchair frequently.  He denies recent chest pain or worsening dyspnea. He did have a presyncopal episode at the end of December and went to the emergency room.  His blood pressure was dropping and since then losartan was decreased to 25 mg daily with subsequent improvement.   Past Medical History:  Diagnosis Date   Abscess of back    Arthritis    Bacterial infection 2015   Cancer (HCC)    skin    Diabetes mellitus without complication (HCC)    Dysrhythmia    Edema    RIGHT ANKLE   GERD (gastroesophageal reflux disease)    Hypertension     Trigeminal nerve disorder    Trigeminal neuralgia     Past Surgical History:  Procedure Laterality Date   APPENDECTOMY  2010   CATARACT EXTRACTION W/PHACO Right 01/26/2017   Procedure: CATARACT EXTRACTION PHACO AND INTRAOCULAR LENS PLACEMENT (IOC);  Surgeon: Nevada Crane, MD;  Location: [facility] ORS;  Service: Ophthalmology;  Laterality: Right;  Lot # [id number] H [place]: 00:48.2 AP%: 9.9 CDE: 5.35   I&D of back abscess     TRIGEMINAL NERVE DECOMPRESSION       Current Outpatient Medications  Medication Sig Dispense Refill   acetaminophen (TYLENOL) 325 MG tablet Take 1-2 tablets (325-650 mg total) by mouth every 4 (four) hours as needed for mild pain (pain score 1-3).     amLODipine (NORVASC) 5 MG tablet Take 1 tablet by mouth daily.     Apoaequorin 10 MG CAPS Take 1 capsule by mouth daily. Prevagen     carbamazepine (CARBATROL) 200 MG 12 hr capsule Take 200 mg by mouth See admin instructions. Take 200mg  by mouth every 6 hours.     cholestyramine light (PREVALITE) 4 GM/DOSE powder Take 4 g by mouth 2 (two) times daily with a meal.     DM-Doxylamine-Acetaminophen (NYQUIL COLD & FLU PO) Take 2 capsules by mouth at bedtime as needed (cold and flu symptoms).     DM-Phenylephrine-Acetaminophen (VICKS DAYQUIL COLD & FLU PO) Take 2 capsules by mouth every 6 (six) hours as needed (cold and flu symptoms).  gabapentin (NEURONTIN) 600 MG tablet Take 600 mg by mouth See admin instructions. Take 600mg  by mouth every 6 hours.     insulin lispro (HUMALOG) 100 UNIT/ML cartridge 20-30 Units. Per pump.     losartan (COZAAR) 25 MG tablet Take 1 tablet (25 mg total) by mouth daily. 30 tablet 0   Multiple Vitamin (MULTI-VITAMIN) tablet Take 1 tablet by mouth daily.     omeprazole (PRILOSEC) 20 MG capsule Take 20 mg by mouth daily.     rosuvastatin (CRESTOR) 10 MG tablet Take 1 tablet by mouth daily.     No current facility-administered medications for this visit.    Allergies:   Patient has no known  allergies.    Social History:  The patient  reports that he has never smoked. He has never used smokeless tobacco. He reports that he does not drink alcohol and does not use drugs.   Family History:  The patient's family history includes Heart disease in his father.    ROS:  Please see the history of present illness.   Otherwise, review of systems are positive for none.   All other systems are reviewed and negative.    PHYSICAL EXAM: VS:  BP 130/60 (BP Location: Left Wrist, Patient Position: Sitting, Cuff Size: Large)   Pulse 82   Ht 5' 5.5" (1.664 m)   Wt 183 lb (83 kg)   SpO2 97%   BMI 29.99 kg/m  , BMI Body mass index is 29.99 kg/m. GEN: Well nourished, well developed, in no acute distress  HEENT: normal  Neck: no JVD, carotid bruits, or masses Cardiac: RRR; no rubs, or gallops, 2 out of 6 systolic murmur in the aortic area.  Mild lower extremity edema. Respiratory:  clear to auscultation bilaterally, normal work of breathing GI: soft, nontender, nondistended, + BS MS: no deformity or atrophy  Skin: warm and dry, no rash Neuro:  Strength and sensation are intact Psych: euthymic mood, full affect   EKG:  EKG is ordered today. The ekg ordered today demonstrates : Normal sinus rhythm with sinus arrhythmia Inferior infarct , age undetermined Poor R wave progression    Recent Labs: 01/29/2023: TSH 1.426 01/30/2023: Magnesium 1.9 02/27/2023: ALT 25; BUN 10; Creatinine, Ser 0.95; Hemoglobin 13.1; Platelets 331; Potassium 3.8; Sodium 128    Lipid Panel No results found for: "CHOL", "TRIG", "HDL", "CHOLHDL", "VLDL", "LDLCALC", "LDLDIRECT"    Wt Readings from Last 3 Encounters:  03/16/23 183 lb (83 kg)  01/29/23 171 lb (77.6 kg)  01/13/23 187 lb 2.7 oz (84.9 kg)           No data to display            ASSESSMENT AND PLAN:  1.  Elevated troponin in the setting of sepsis in October to the 3000 range could be due to supply demand mismatch or obstructive  coronary artery disease especially in the setting of severe coronary calcifications on CT scan.  I agree with cardiac PET scan which is scheduled for today.  I asked him to resume aspirin 81 mg once daily.  2.  Syncope and presyncope due to suspected hypotension and orthostatic hypotension.  He reports improvement since losartan was decreased.  3.  Hyperlipidemia: Currently on rosuvastatin 10 mg daily.  Recommended target LDL of less than 70.  4.  Mildly dilated right ventricle: Noted on echo but I agree that this is likely not clinically significant.    Disposition:   FU with me in 3 months  Signed,  Lorine Bears, MD  03/16/2023 9:35 AM    [facility]

## 2023-06-27 ENCOUNTER — Encounter: Payer: Self-pay | Admitting: Cardiovascular Disease

## 2023-06-27 ENCOUNTER — Ambulatory Visit: Payer: Medicare Other | Attending: Cardiovascular Disease | Admitting: Cardiovascular Disease

## 2023-06-27 VITALS — BP 168/82 | HR 66 | Ht 65.0 in | Wt 191.6 lb

## 2023-06-27 DIAGNOSIS — E785 Hyperlipidemia, unspecified: Secondary | ICD-10-CM | POA: Diagnosis not present

## 2023-06-27 DIAGNOSIS — I10 Essential (primary) hypertension: Secondary | ICD-10-CM | POA: Diagnosis not present

## 2023-06-27 DIAGNOSIS — I2510 Atherosclerotic heart disease of native coronary artery without angina pectoris: Secondary | ICD-10-CM | POA: Diagnosis not present

## 2023-06-27 DIAGNOSIS — R0989 Other specified symptoms and signs involving the circulatory and respiratory systems: Secondary | ICD-10-CM | POA: Diagnosis not present

## 2023-06-27 NOTE — Progress Notes (Unsigned)
 Cardiology Office Note   Date:  06/28/2023   ID:  Larry Lane, DOB March 08, 1940, MRN [id number]  PCP:  Larry San, MD  Cardiologist:   Antionette Kirks, MD   No chief complaint on file.     History of Present Illness: Larry Lane is a 83 y.o. male who presents for a follow-up visit regarding elevated troponin and syncope. He has known history of essential hypertension, hyperlipidemia, PVCs and diabetes mellitus. He was hospitalized at [facility] in November with sepsis  and elevated troponin to the 3000 range..  Echo showed normal LV systolic function but dilated RV with suspicion for pulmonary embolism.  CTA was poor quality but there was enough suspicion for pulmonary embolism.  He was started on anticoagulation.  CTA was repeated and showed no evidence of pulmonary embolism but extensive coronary artery calcifications.  Anticoagulation was discontinued.  Repeat echocardiogram on December 1 showed normal LV systolic function with only mildly dilated right ventricle with normal systolic function. He was rehospitalized in early December with presyncope.  Troponin was negative.  He had difficulty in ambulating since he had lumbar puncture in December with persistent left leg weakness.  He had orthostatic dizziness that improved after decreasing losartan . He underwent cardiac PET scan in January which showed no evidence of ischemia.  There was evidence of a small infarct in the inferolateral area with normal ejection fraction.  He reports no chest pain or worsening dyspnea.   Past Medical History:  Diagnosis Date   Abscess of back    Arthritis    Bacterial infection 2015   Cancer (HCC)    skin    Diabetes mellitus without complication (HCC)    Dysrhythmia    Edema    RIGHT ANKLE   GERD (gastroesophageal reflux disease)    Hypertension    Trigeminal nerve disorder    Trigeminal neuralgia     Past Surgical History:  Procedure Laterality Date   APPENDECTOMY  2010    CATARACT EXTRACTION W/PHACO Right 01/26/2017   Procedure: CATARACT EXTRACTION PHACO AND INTRAOCULAR LENS PLACEMENT (IOC);  Surgeon: Rosa College, MD;  Location: [facility] ORS;  Service: Ophthalmology;  Laterality: Right;  Lot # [id number] H [place] : 00:48.2 AP%: 9.9 CDE: 5.35   I&D of back abscess     TRIGEMINAL NERVE DECOMPRESSION       Current Outpatient Medications  Medication Sig Dispense Refill   acetaminophen  (TYLENOL ) 325 MG tablet Take 1-2 tablets (325-650 mg total) by mouth every 4 (four) hours as needed for mild pain (pain score 1-3).     amLODipine  (NORVASC ) 5 MG tablet Take 1 tablet by mouth daily.     Apoaequorin 10 MG CAPS Take 1 capsule by mouth daily. Prevagen     aspirin  EC 81 MG tablet Take 1 tablet (81 mg total) by mouth daily. Swallow whole.     carbamazepine  (CARBATROL ) 200 MG 12 hr capsule Take 200 mg by mouth See admin instructions. Take 200mg  by mouth every 6 hours.     cholestyramine  light (PREVALITE ) 4 GM/DOSE powder Take 4 g by mouth 2 (two) times daily with a meal.     DM-Doxylamine-Acetaminophen  (NYQUIL COLD & FLU PO) Take 2 capsules by mouth at bedtime as needed (cold and flu symptoms).     DM-Phenylephrine-Acetaminophen  (VICKS DAYQUIL COLD & FLU PO) Take 2 capsules by mouth every 6 (six) hours as needed (cold and flu symptoms).     gabapentin  (NEURONTIN ) 600 MG tablet Take 600 mg by mouth  See admin instructions. Take 600mg  by mouth every 6 hours.     Insulin  Disposable Pump (V-GO 20) 20 UNIT/24HR KIT INJECT 20 EACH SUBCUTANEOUSLY ONCE DAILY. ADD ADDITIONAL CLICKS WITH MEALS, UP TO A TOTAL OF SIX CLICKS PER DAY     insulin  lispro (HUMALOG) 100 UNIT/ML cartridge 20-30 Units. Per pump.     losartan  (COZAAR ) 25 MG tablet Take 1 tablet (25 mg total) by mouth daily. 30 tablet 0   Misc Natural Products (GLUCOSAMINE CHOND CMP ADVANCED) TABS Take by mouth.     Multiple Vitamin (MULTI-VITAMIN) tablet Take 1 tablet by mouth daily.     omeprazole (PRILOSEC) 20 MG capsule Take 20  mg by mouth daily.     rosuvastatin  (CRESTOR ) 10 MG tablet Take 1 tablet by mouth daily.     Turmeric (QC TUMERIC COMPLEX) 500 MG CAPS Take by mouth.     No current facility-administered medications for this visit.    Allergies:   Patient has no known allergies.    Social History:  The patient  reports that he has never smoked. He has never used smokeless tobacco. He reports that he does not drink alcohol and does not use drugs.   Family History:  The patient's family history includes Heart disease in his father.    ROS:  Please see the history of present illness.   Otherwise, review of systems are positive for none.   All other systems are reviewed and negative.    PHYSICAL EXAM: VS:  BP (!) 168/82   Pulse 66   Ht 5\' 5"  (1.651 m)   Wt 191 lb 9.6 oz (86.9 kg)   SpO2 94%   BMI 31.88 kg/m  , BMI Body mass index is 31.88 kg/m. GEN: Well nourished, well developed, in no acute distress  HEENT: normal  Neck: no JVD or masses.  Right carotid bruit Cardiac: RRR; no rubs, or gallops, 2/6 systolic murmur in the aortic area.  Mild lower extremity edema. Respiratory:  clear to auscultation bilaterally, normal work of breathing GI: soft, nontender, nondistended, + BS MS: no deformity or atrophy  Skin: warm and dry, no rash Neuro:  Strength and sensation are intact Psych: euthymic mood, full affect   EKG:  EKG is not ordered today.    Recent Labs: 01/29/2023: TSH 1.426 01/30/2023: Magnesium  1.9 02/27/2023: ALT 25; BUN 10; Creatinine, Ser 0.95; Hemoglobin 13.1; Platelets 331; Potassium 3.8; Sodium 128    Lipid Panel No results found for: "CHOL", "TRIG", "HDL", "CHOLHDL", "VLDL", "LDLCALC", "LDLDIRECT"    Wt Readings from Last 3 Encounters:  06/27/23 191 lb 9.6 oz (86.9 kg)  03/16/23 171 lb (77.6 kg)  03/16/23 183 lb (83 kg)           No data to display            ASSESSMENT AND PLAN:  1.  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  Cardiac PET scan showed no evidence of ischemia but he does have extensive coronary artery calcifications.  I recommend continuing medical therapy with low-dose aspirin  and treatment of risk factors.  2.  Syncope and presyncope due to suspected hypotension and orthostatic hypotension.  He reports improvement since losartan  was decreased.  3.  Hyperlipidemia: Currently on rosuvastatin  10 mg daily.  Most recent lipid profile showed an LDL of 65.  4.  Mildly dilated right ventricle: Noted on  echo but I agree that this is likely not clinically significant.  5.  Right carotid bruit: I requested carotid Doppler.  6.  Essential hypertension: His blood pressure is elevated today.  I elected not to increase losartan  given known history of orthostatic hypotension.    Disposition:   FU with me in 6 months  Signed,  Antionette Kirks, MD  06/28/2023 8:42 AM     [facility]

## 2023-06-27 NOTE — Patient Instructions (Signed)
 Medication Instructions:  No changes *If you need a refill on your cardiac medications before your next appointment, please call your pharmacy*  Lab Work: None ordered If you have labs (blood work) drawn today and your tests are completely normal, you will receive your results only by: MyChart Message (if you have MyChart) OR A paper copy in the mail If you have any lab test that is abnormal or we need to change your treatment, we will call you to review the results.  Testing/Procedures: Your physician has requested that you have a carotid duplex. This test is an ultrasound of the carotid arteries in your neck. It looks at blood flow through these arteries that supply the brain with blood.   Allow one hour for this exam.  There are no restrictions or special instructions.  This will take place at [address] [facility]) [address], [place] [address]  Please note: We ask at that you not bring children with you during ultrasound (echo/ vascular) testing. Due to room size and safety concerns, children are not allowed in the ultrasound rooms during exams. Our front office staff cannot provide observation of children in our lobby area while testing is being conducted. An adult accompanying a patient to their appointment will only be allowed in the ultrasound room at the discretion of the ultrasound technician under special circumstances. We apologize for any inconvenience.   Follow-Up: At [facility], you and your health needs are our priority.  As part of our continuing mission to provide you with exceptional heart care, our providers are all part of one team.  This team includes your primary Cardiologist (physician) and Advanced Practice Providers or APPs (Physician Assistants and Nurse Practitioners) who all work together to provide you with the care you need, when you need it.  Your next appointment:   6 month(s)  Provider:   You may see Antionette Kirks, MD or one of  the following Advanced Practice Providers on your designated Care Team:   Laneta Pintos, NP Gildardo Labrador, PA-C Varney Gentleman, PA-C Cadence Suffield, PA-C Ronald Cockayne, NP Morey Ar, NP    We recommend signing up for the patient portal called "MyChart".  Sign up information is provided on this After Visit Summary.  MyChart is used to connect with patients for Virtual Visits (Telemedicine).  Patients are able to view lab/test results, encounter notes, upcoming appointments, etc.  Non-urgent messages can be sent to your provider as well.   To learn more about what you can do with MyChart, go to [email or web address].

## 2023-07-19 ENCOUNTER — Ambulatory Visit: Attending: Cardiovascular Disease

## 2023-07-19 DIAGNOSIS — R0989 Other specified symptoms and signs involving the circulatory and respiratory systems: Secondary | ICD-10-CM

## 2023-07-20 ENCOUNTER — Ambulatory Visit: Payer: Self-pay | Admitting: Cardiovascular Disease

## 2023-07-24 ENCOUNTER — Encounter: Payer: Self-pay | Admitting: Cardiovascular Disease

## 2023-10-17 ENCOUNTER — Ambulatory Visit: Admitting: Dermatology

## 2023-10-17 ENCOUNTER — Encounter: Payer: Self-pay | Admitting: Dermatology

## 2023-10-17 DIAGNOSIS — C4442 Squamous cell carcinoma of skin of scalp and neck: Secondary | ICD-10-CM | POA: Diagnosis not present

## 2023-10-17 DIAGNOSIS — D485 Neoplasm of uncertain behavior of skin: Secondary | ICD-10-CM | POA: Diagnosis not present

## 2023-10-17 DIAGNOSIS — D489 Neoplasm of uncertain behavior, unspecified: Secondary | ICD-10-CM

## 2023-10-17 NOTE — Progress Notes (Signed)
   New Patient Visit   Subjective  Larry Lane is a 83 y.o. male who presents for the following: Scab on head that has been there since patient hit his head about 3 months ago. Patient denied bleeding or drainage. Has used Vaseline on lesion in the past but is not currently.    The following portions of the chart were reviewed this encounter and updated as appropriate: medications, allergies, medical history  Review of Systems:  No other skin or systemic complaints except as noted in HPI or Assessment and Plan.  Objective  Well appearing patient in no apparent distress; mood and affect are within normal limits.   A focused examination was performed of the following areas: Head  Relevant exam findings are noted in the Assessment and Plan.       Scalp 2.2 x 2 cm keratotic crusted plaque   Assessment & Plan     NEOPLASM OF UNCERTAIN BEHAVIOR Scalp Skin / nail biopsy Type of biopsy: tangential   Informed consent: discussed and consent obtained   Patient was prepped and draped in usual sterile fashion: Area prepped with alcohol. Anesthesia: the lesion was anesthetized in a standard fashion   Anesthetic:  1% lidocaine  w/ epinephrine  1-100,000 buffered w/ 8.4% NaHCO3 Instrument used: flexible razor blade   Hemostasis achieved with: pressure, aluminum chloride and electrodesiccation   Outcome: patient tolerated procedure well   Post-procedure details: wound care instructions given   Post-procedure details comment:  Ointment and small bandage applied  Specimen 1 - Surgical pathology Differential Diagnosis: SCC vs BCC vs melanoma   Check Margins: No  Return for Pending biopsy results.  I, Emerick Ege, CMA am acting as scribe for Boneta Sharps, MD.   Documentation: I have reviewed the above documentation for accuracy and completeness, and I agree with the above.  Boneta Sharps, MD

## 2023-10-17 NOTE — Patient Instructions (Addendum)
 Wound Care Instructions  Cleanse wound gently with soap and water once a day then pat dry with clean gauze. Apply a thin coat of Petrolatum (petroleum jelly, Vaseline) over the wound (unless you have an allergy to this). We recommend that you use a new, sterile tube of Vaseline. Do not pick or remove scabs. Do not remove the yellow or white healing tissue from the base of the wound.  Cover the wound with fresh, clean, nonstick gauze and secure with paper tape. You may use Band-Aids in place of gauze and tape if the wound is small enough, but would recommend trimming much of the tape off as there is often too much. Sometimes Band-Aids can irritate the skin.  You should call the office for your biopsy report after 1 week if you have not already been contacted.  If you experience any problems, such as abnormal amounts of bleeding, swelling, significant bruising, significant pain, or evidence of infection, please call the office immediately.  FOR ADULT SURGERY PATIENTS: If you need something for pain relief you may take 1 extra strength Tylenol  (acetaminophen ) AND 2 Ibuprofen (200mg  each) together every 4 hours as needed for pain. (do not take these if you are allergic to them or if you have a reason you should not take them.) Typically, you may only need pain medication for 1 to 3 days.    Melanoma ABCDEs  Melanoma is the most dangerous type of skin cancer, and is the leading cause of death from skin disease.  You are more likely to develop melanoma if you: Have light-colored skin, light-colored eyes, or red or blond hair Spend a lot of time in the sun Tan regularly, either outdoors or in a tanning bed Have had blistering sunburns, especially during childhood Have a close family member who has had a melanoma Have atypical moles or large birthmarks  Early detection of melanoma is key since treatment is typically straightforward and cure rates are extremely high if we catch it early.   The  first sign of melanoma is often a change in a mole or a new dark spot.  The ABCDE system is a way of remembering the signs of melanoma.  A for asymmetry:  The two halves do not match. B for border:  The edges of the growth are irregular. C for color:  A mixture of colors are present instead of an even brown color. D for diameter:  Melanomas are usually (but not always) greater than 6mm - the size of a pencil eraser. E for evolution:  The spot keeps changing in size, shape, and color.  Please check your skin once per month between visits. You can use a small mirror in front and a large mirror behind you to keep an eye on the back side or your body.   If you see any new or changing lesions before your next follow-up, please call to schedule a visit.  Please continue daily skin protection including broad spectrum sunscreen SPF 30+ to sun-exposed areas, reapplying every 2 hours as needed when you're outdoors.      Due to recent changes in healthcare laws, you may see results of your pathology and/or laboratory studies on MyChart before the doctors have had a chance to review them. We understand that in some cases there may be results that are confusing or concerning to you. Please understand that not all results are received at the same time and often the doctors may need to interpret multiple results in order  to provide you with the best plan of care or course of treatment. Therefore, we ask that you please give us  2 business days to thoroughly review all your results before contacting the office for clarification. Should we see a critical lab result, you will be contacted sooner.   Recommend daily broad spectrum sunscreen SPF 30+ to sun-exposed areas, reapply every 2 hours as needed. Call for new or changing lesions.  Staying in the shade or wearing long sleeves, sun glasses (UVA+UVB protection) and wide brim hats (4-inch brim around the entire circumference of the hat) are also recommended for sun  protection.     If You Need Anything After Your Visit  If you have any questions or concerns for your doctor, please call our main line at [phone] and press option 4 to reach your doctor's medical assistant. If no one answers, please leave a voicemail as directed and we will return your call as soon as possible. Messages left after 4 pm will be answered the following business day.   You may also send us  a message via MyChart. We typically respond to MyChart messages within 1-2 business days.  For prescription refills, please ask your pharmacy to contact our office. Our fax number is [phone].  If you have an urgent issue when the clinic is closed that cannot wait until the next business day, you can page your doctor at the number below.    Please note that while we do our best to be available for urgent issues outside of office hours, we are not available 24/7.   If you have an urgent issue and are unable to reach us , you may choose to seek medical care at your doctor's office, retail clinic, urgent care center, or emergency room.  If you have a medical emergency, please immediately call 911 or go to the emergency department.  Pager Numbers  - Dr. Hester: [phone]  - Dr. Jackquline: [phone]  - Dr. Claudene: [phone]   - Dr. Raymund: [phone]  In the event of inclement weather, please call our main line at [phone] for an update on the status of any delays or closures.  Dermatology Medication Tips: Please keep the boxes that topical medications come in in order to help keep track of the instructions about where and how to use these. Pharmacies typically print the medication instructions only on the boxes and not directly on the medication tubes.   If your medication is too expensive, please contact our office at [phone] option 4 or send us  a message through MyChart.   We are unable to tell what your co-pay for medications will be in advance as this is  different depending on your insurance coverage. However, we may be able to find a substitute medication at lower cost or fill out paperwork to get insurance to cover a needed medication.   If a prior authorization is required to get your medication covered by your insurance company, please allow us  1-2 business days to complete this process.  Drug prices often vary depending on where the prescription is filled and some pharmacies may offer cheaper prices.  The website www.goodrx.com contains coupons for medications through different pharmacies. The prices here do not account for what the cost may be with help from insurance (it may be cheaper with your insurance), but the website can give you the price if you did not use any insurance.  - You can print the associated coupon and take it with your prescription to the pharmacy.  -  You may also stop by our office during regular business hours and pick up a GoodRx coupon card.  - If you need your prescription sent electronically to a different pharmacy, notify our office through [facility] or by phone at [phone] option 4.     Si Usted Necesita Algo Despus de Su Visita  Tambin puede enviarnos un mensaje a travs de Clinical cytogeneticist. Por lo general respondemos a los mensajes de MyChart en el transcurso de 1 a 2 das hbiles.  Para renovar recetas, por favor pida a su farmacia que se ponga en contacto con nuestra oficina. Randi lakes de fax es [place] [phone].  Si tiene un asunto urgente cuando la clnica est cerrada y que no puede esperar hasta el siguiente da hbil, puede llamar/localizar a su doctor(a) al nmero que aparece a continuacin.   Por favor, tenga en cuenta que aunque hacemos todo lo posible para estar disponibles para asuntos urgentes fuera del horario de [place], no estamos disponibles las 24 horas del da, los 7 [address]  [address] de la [place].   Si tiene un problema urgente y no puede comunicarse con nosotros, puede optar por buscar  atencin mdica  en el consultorio de su doctor(a), en una clnica privada, en un centro de atencin urgente o en una sala de emergencias.  Si tiene Engineer, drilling, por favor llame inmediatamente al 911 o vaya a la sala de emergencias.  Nmeros de bper  - Dr. Hester: [phone]  - Dra. Jackquline: [phone]  - Dr. Claudene: [phone]  - Dra. Kitts: [phone]  En caso de inclemencias del [place], por favor llame a nuestra lnea principal al [phone] para una actualizacin sobre el estado de cualquier retraso o cierre.  Consejos para la medicacin en dermatologa: Por favor, guarde las cajas en las que vienen los medicamentos de uso tpico para ayudarle a seguir las instrucciones sobre dnde y cmo usarlos. Las farmacias generalmente imprimen las instrucciones del medicamento slo en las cajas y no directamente en los tubos del [place].   Si su medicamento es muy caro, por favor, pngase en contacto con landry rieger llamando al [phone] y presione la opcin 4 o envenos un mensaje a travs de Clinical cytogeneticist.   No podemos decirle cul ser su copago por los medicamentos por adelantado ya que esto es diferente dependiendo de la cobertura de su seguro. Sin embargo, es posible que podamos encontrar un medicamento sustituto a Audiological scientist un formulario para que el seguro cubra el medicamento que se considera necesario.   Si se requiere una autorizacin previa para que su compaa de seguros malta su medicamento, por favor permtanos de 1 a 2 das hbiles para completar este proceso.  Los precios de los medicamentos varan con frecuencia dependiendo del Environmental consultant de dnde se surte la receta y alguna farmacias pueden ofrecer precios ms baratos.  El sitio web www.goodrx.com tiene cupones para medicamentos de Health and safety inspector. Los precios aqu no tienen en cuenta lo que podra costar con la ayuda del seguro (puede ser ms barato con su seguro), pero el sitio web puede  darle el precio si no utiliz Tourist information centre manager.  - Puede imprimir el cupn correspondiente y llevarlo con su receta a la farmacia.  - Tambin puede pasar por nuestra oficina durante el horario de atencin regular y Education officer, museum una tarjeta de cupones de GoodRx.  - Si necesita que su receta se enve electrnicamente a Psychiatrist, informe a nuestra oficina a travs de MyChart de [employer] o por  telfono llamando al [phone] y presione la opcin 4.

## 2023-10-19 ENCOUNTER — Ambulatory Visit: Payer: Self-pay | Admitting: Dermatology

## 2023-10-19 DIAGNOSIS — C4492 Squamous cell carcinoma of skin, unspecified: Secondary | ICD-10-CM

## 2023-10-19 HISTORY — DX: Squamous cell carcinoma of skin, unspecified: C44.92

## 2023-10-19 LAB — SURGICAL PATHOLOGY

## 2023-10-23 ENCOUNTER — Encounter: Payer: Self-pay | Admitting: Dermatology

## 2023-10-23 NOTE — Telephone Encounter (Signed)
-----   Message from Donnelly sent at 10/19/2023  5:24 PM EDT ----- Diagnosis: scalp :       POORLY DIFFERENTIATED SQUAMOUS CELL CARCINOMA, CRUSTED   Please call with diagnosis and refer to Mohs surgery.  Explanation: This is a squamous cell skin cancer that has grown beyond the surface of the skin and is invading the second layer of the skin. It is unfortunately an aggressive skin cancer and has a  high likelihood of spread beyond the skin and threatening your health, so I recommend treating it.  Treatment: Given the location and type of skin cancer, I recommend Mohs surgery. Mohs surgery involves cutting out the skin cancer and then checking under the microscope to ensure the whole skin  cancer was removed. If any skin cancer remains, the surgeon will cut out more until it is fully removed. The cure rate is about 98-99%. Once the Mohs surgeon confirms the skin cancer is out, they  will discuss the options to repair or heal the area. You must take it easy for about two weeks after surgery (no lifting over 10-15 lbs, avoid activity to get your heart rate and blood pressure up).  It is done at another office outside of [place] ([place], [place], or [place]). ----- Message ----- From: Interface, Lab In Three Zero One Sent: 10/19/2023   3:52 PM EDT To: Boneta Sharps, MD

## 2023-10-23 NOTE — Telephone Encounter (Signed)
 Patient's wife advised pathology results showed SCC, prefers Mohs in [place]. Referral sent for Mohs with Dr. Corey. Lonell RAMAN., RMA

## 2023-10-24 ENCOUNTER — Ambulatory Visit (INDEPENDENT_AMBULATORY_CARE_PROVIDER_SITE_OTHER): Admitting: Podiatry

## 2023-10-24 DIAGNOSIS — M2042 Other hammer toe(s) (acquired), left foot: Secondary | ICD-10-CM

## 2023-10-24 DIAGNOSIS — M79674 Pain in right toe(s): Secondary | ICD-10-CM | POA: Diagnosis not present

## 2023-10-24 DIAGNOSIS — M79675 Pain in left toe(s): Secondary | ICD-10-CM | POA: Diagnosis not present

## 2023-10-24 DIAGNOSIS — B351 Tinea unguium: Secondary | ICD-10-CM

## 2023-10-24 DIAGNOSIS — M2041 Other hammer toe(s) (acquired), right foot: Secondary | ICD-10-CM

## 2023-10-24 DIAGNOSIS — Z794 Long term (current) use of insulin: Secondary | ICD-10-CM

## 2023-10-24 DIAGNOSIS — E1142 Type 2 diabetes mellitus with diabetic polyneuropathy: Secondary | ICD-10-CM | POA: Diagnosis not present

## 2023-10-24 NOTE — Progress Notes (Signed)
  Subjective:  Patient ID: Larry Lane, male    DOB: [birth year],  MRN: [id number]  Chief Complaint  Patient presents with   Diabetes    Nail trim   83 y.o. male returns for the above complaint.  Patient presents with thickened onychodystrophy mycotic toenails x 10 mild pain on palpation worse with ambulation is with pressure he would like to discuss treatment option for this has not seen and was prior to seeing me for this.  He would like to also discussed diabetic shoes he is a diabetic with underlying hammertoe contracture he would like to obtain shoes as well  Objective:  There were no vitals filed for this visit. Podiatric Exam: Vascular: dorsalis pedis and posterior tibial pulses are palpable bilateral. Capillary return is immediate. Temperature gradient is WNL. Skin turgor WNL  Sensorium: Normal Semmes Weinstein monofilament test. Normal tactile sensation bilaterally. Nail Exam: Pt has thick disfigured discolored nails with subungual debris noted bilateral entire nail hallux through fifth toenails.  Pain on palpation to the nails. Ulcer Exam: There is no evidence of ulcer or pre-ulcerative changes or infection. Orthopedic Exam: Muscle tone and strength are WNL. No limitations in general ROM. No crepitus or effusions noted.  Hammertoe contracture noted bilateral 2 through 5.  Semiflexible in nature Skin: No Porokeratosis. No infection or ulcers    Assessment & Plan:   1. Type 2 diabetes mellitus with diabetic polyneuropathy, with long-term current use of insulin  (HCC)   2. Hammertoe, bilateral   3. Pain due to onychomycosis of toenails of both feet [B35.1, M79.674, M79.675]     Patient was evaluated and treated and all questions answered.  Hammertoe deformity bilateral - Given the amount of hammertoe deformity in setting of diabetes patient will benefit from diabetic shoes to decrease the risk of ulceration patient agrees with plan to proceed with diabetic  shoes  Onychomycosis with pain  -Nails palliatively debrided as below. -Educated on self-care  Procedure: Nail Debridement Rationale: pain  Type of Debridement: manual, sharp debridement. Instrumentation: Nail nipper, rotary burr. Number of Nails: 10  Procedures and Treatment: Consent by patient was obtained for treatment procedures. The patient understood the discussion of treatment and procedures well. All questions were answered thoroughly reviewed. Debridement of mycotic and hypertrophic toenails, 1 through 5 bilateral and clearing of subungual debris. No ulceration, no infection noted.  Return Visit-Office Procedure: Patient instructed to return to the office for a follow up visit 3 months for continued evaluation and treatment.  Larry Lane, DPM    No follow-ups on file.

## 2023-11-15 ENCOUNTER — Encounter: Payer: Self-pay | Admitting: Dermatology

## 2023-11-21 ENCOUNTER — Encounter: Payer: Self-pay | Admitting: Dermatology

## 2023-11-21 ENCOUNTER — Ambulatory Visit (INDEPENDENT_AMBULATORY_CARE_PROVIDER_SITE_OTHER): Admitting: Dermatology

## 2023-11-21 VITALS — BP 125/97 | HR 73 | Temp 97.9°F

## 2023-11-21 DIAGNOSIS — L814 Other melanin hyperpigmentation: Secondary | ICD-10-CM

## 2023-11-21 DIAGNOSIS — C4442 Squamous cell carcinoma of skin of scalp and neck: Secondary | ICD-10-CM | POA: Diagnosis not present

## 2023-11-21 DIAGNOSIS — L578 Other skin changes due to chronic exposure to nonionizing radiation: Secondary | ICD-10-CM | POA: Diagnosis not present

## 2023-11-21 DIAGNOSIS — C4492 Squamous cell carcinoma of skin, unspecified: Secondary | ICD-10-CM

## 2023-11-21 NOTE — Patient Instructions (Signed)

## 2023-11-21 NOTE — Progress Notes (Signed)
 Follow-Up Visit   Subjective  Larry Lane is a 83 y.o. 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 Claudene. Patient is accompanied by his wife.   The following portions of the chart were reviewed this encounter and updated as appropriate: medications, allergies, medical history  Review of Systems:  No other skin or systemic complaints except as noted in HPI or Assessment and Plan.  Objective  Well appearing patient in no apparent distress; mood and affect are within normal limits.  A focused examination was performed of the following areas: scalp Relevant physical exam findings are noted in the Assessment and Plan.   Scalp Hemorrhagic crusted atrophic plaque   Assessment & Plan   SQUAMOUS CELL CARCINOMA OF SKIN Scalp Mohs surgery  Consent obtained: written  Anticoagulation: Is the patient taking prescription anticoagulant and/or aspirin  prescribed/recommended by a physician? Yes   Was the anticoagulation regimen changed prior to Mohs? No    Anesthesia: Anesthesia method: local infiltration Local anesthetic: lidocaine  1% WITH epi  Procedure Details: Timeout: pre-procedure verification complete Procedure Prep: patient was prepped and draped in usual sterile fashion Prep type: chlorhexidine  Biopsy accession number: [id number] Biopsy lab: [facility] Date of biopsy: 10/17/2023 Frozen section biopsy performed: No   Specimen debulked: Yes   Pre-Op diagnosis: squamous cell carcinoma SCC subtype: poorly differentiated MohsAIQ Surgical site (if tumor spans multiple areas, please select predominant area): scalp Surgical site (from skin exam): Scalp Pre-operative length (cm): 1.5 Pre-operative width (cm): 2 Indications for Mohs surgery: anatomic location where tissue conservation is critical Previously treated? No    Micrographic Surgery Details: Post-operative length (cm):  2.8 Post-operative width (cm): 2.8 Number of Mohs stages: 1 Cumulative additional sections past 5 per stage: 0 Post surgery depth of defect: dermis Indication for sending permanent sections: evaluate a debulking specimen  Stage 1    Tumor features identified on Mohs section: no tumor identified  Patient tolerance of procedure: tolerated well, no immediate complications  Reconstruction: Was the defect reconstructed?: No (Second Intention)    Antibiotics: Does patient meet AHA guidelines for endocarditis?: No   Does patient meet AHA guidelines for orthopedic prophylaxis?: No   Were antibiotics given on the day of surgery?: No   Did surgery breach mucosa, expose cartilage/bone, involve an area of lymphedema/inflamed/infected tissue? No    Specimen 1 - Surgical pathology Differential Diagnosis: Debulk submitted for poorly differentiated SCC  R/P PNI  Check Margins: No   Return in about 4 weeks (around 12/19/2023) for Wound Check.   11/21/2023  HISTORY OF PRESENT ILLNESS  Larry Lane is seen in consultation at the request of Dr. 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 They note that the area has been present for about 4 months increasing in size with time.  There is no history of previous treatment.  Reports no other new or changing lesions and has no other complaints today.  Medications and allergies: see patient chart.  Review of systems: Reviewed 8 systems and notable for the above skin cancer.  All other systems reviewed are unremarkable/negative, unless noted in the HPI. Past medical history, surgical history, family history, social history were also reviewed and are noted in the chart/questionnaire.    PHYSICAL EXAMINATION  General: Well-appearing, in no acute distress, alert and oriented x 4. Vitals reviewed in chart (if available).   Skin: Exam reveals a 1.5 x 2.0 cm erythematous papule and  biopsy scar on the scalp. There  are rhytids, telangiectasias, and lentigines, consistent with photodamage. Lymph nodes: No cervical, axillary or inguinal lymphadenopathy.  Biopsy report(s) reviewed, confirming the diagnosis.   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 Due to location, size, histology, or recurrence and the likelihood of subclinical extension as well as the need to conserve normal surrounding tissue, the patient was deemed acceptable for Mohs micrographic surgery (MMS).  The nature and purpose of the procedure, associated benefits and risks including recurrence and scarring, possible complications such as pain, infection, and bleeding, and alternative methods of treatment if appropriate were discussed with the patient during consent. The lesion location was verified by the patient, by reviewing previous notes, pathology reports, and by photographs as well as angulation measurements if available.  Informed consent was reviewed and signed by the patient, and timeout was performed at 9:00 AM. See op note below.  2. For the photodamage and solar lentigines, sun protection discussed/information given on OTC sunscreens, and we recommend continued regular follow-up with primary dermatologist every 6 months or sooner for any growing, bleeding, or changing lesions. 3. Prognosis and future surveillance discussed. 4. Letter with treatment outcome sent to referring provider. 5. Pain acetaminophen /ibuprofen  MOHS MICROGRAPHIC SURGERY AND RECONSTRUCTION  Initial size:   1.5 x  2.0 cm Surgical defect/wound size: 2.8 x 2.8 cm Anesthesia:    0.33% lidocaine  with 1:200,000 epinephrine  EBL:    <5 mL Complications:  None Repair type:   Second Intention  Stages: 1  STAGE I: Anesthesia achieved with 0.5% lidocaine  with 1:200,000 epinephrine . ChloraPrep applied. 1 section(s) excised using Mohs technique (this includes total peripheral and deep  tissue margin excision and evaluation with frozen sections, excised and interpreted by the same physician). The tumor was first debulked and then excised with an approx. 2mm margin.  Hemostasis was achieved with electrocautery as needed.  The specimen was then oriented, subdivided/relaxed, inked, and processed using Mohs technique.    Frozen section analysis revealed a clear deep and peripheral margin.  Reconstruction  Patient was notified of results and repair options were discussed, including second intention healing. After reviewing the advantages and disadvantages of each, we agreed on second intention healing as appropriate.   The surgical site was then lightly scrubbed with sterile, saline-soaked gauze.  The area was bandaged using Vaseline ointment, non-adherent gauze, gauze pads, and tape to provide an adequate pressure dressing.   The patient tolerated the procedure well, was given detailed written and verbal wound care instructions, and was discharged in good condition.  The patient will follow-up in 4 weeks and as scheduled with primary dermatologist.  Patient was counseled regarding the diagnosis of a poorly differentiated squamous cell carcinoma of the scalp. While perineural invasion (PNI) was not identified on initial evaluation of the debulk specimen, the tumor's large size and high histologic grade raise concern for aggressive behavior. Due to these high-risk features, I discussed with the patient the plan to submit the entire debulk tissue for permanent sectioning to more thoroughly evaluate for PNI or other high-risk histologic features. The patient was informed that confirmation of such features would result in a high BWH T-stage, which may necessitate further workup, including imaging to assess for regional lymph node involvement and potential multidisciplinary management. Patient expressed understanding and agreed with the plan.  Documentation: I have reviewed the above  documentation for accuracy and completeness, and I agree with  the above.  RUFUS CHRISTELLA HOLY, MD

## 2023-11-30 ENCOUNTER — Encounter: Payer: Self-pay | Admitting: Dermatology

## 2023-12-04 ENCOUNTER — Ambulatory Visit: Admitting: Dermatology

## 2023-12-04 ENCOUNTER — Encounter: Payer: Self-pay | Admitting: Dermatology

## 2023-12-04 DIAGNOSIS — C4442 Squamous cell carcinoma of skin of scalp and neck: Secondary | ICD-10-CM

## 2023-12-04 DIAGNOSIS — Z1283 Encounter for screening for malignant neoplasm of skin: Secondary | ICD-10-CM | POA: Diagnosis not present

## 2023-12-04 DIAGNOSIS — W908XXA Exposure to other nonionizing radiation, initial encounter: Secondary | ICD-10-CM

## 2023-12-04 DIAGNOSIS — L578 Other skin changes due to chronic exposure to nonionizing radiation: Secondary | ICD-10-CM | POA: Diagnosis not present

## 2023-12-04 DIAGNOSIS — L821 Other seborrheic keratosis: Secondary | ICD-10-CM

## 2023-12-04 DIAGNOSIS — D485 Neoplasm of uncertain behavior of skin: Secondary | ICD-10-CM | POA: Diagnosis not present

## 2023-12-04 DIAGNOSIS — D239 Other benign neoplasm of skin, unspecified: Secondary | ICD-10-CM

## 2023-12-04 DIAGNOSIS — D225 Melanocytic nevi of trunk: Secondary | ICD-10-CM | POA: Diagnosis not present

## 2023-12-04 DIAGNOSIS — D229 Melanocytic nevi, unspecified: Secondary | ICD-10-CM

## 2023-12-04 DIAGNOSIS — D044 Carcinoma in situ of skin of scalp and neck: Secondary | ICD-10-CM

## 2023-12-04 DIAGNOSIS — Z85828 Personal history of other malignant neoplasm of skin: Secondary | ICD-10-CM

## 2023-12-04 DIAGNOSIS — D099 Carcinoma in situ, unspecified: Secondary | ICD-10-CM

## 2023-12-04 DIAGNOSIS — D1801 Hemangioma of skin and subcutaneous tissue: Secondary | ICD-10-CM

## 2023-12-04 DIAGNOSIS — L814 Other melanin hyperpigmentation: Secondary | ICD-10-CM

## 2023-12-04 HISTORY — DX: Other benign neoplasm of skin, unspecified: D23.9

## 2023-12-04 HISTORY — DX: Carcinoma in situ, unspecified: D09.9

## 2023-12-04 NOTE — Patient Instructions (Addendum)
 Wound Care Instructions  Cleanse wound gently with soap and water once a day then pat dry with clean gauze. Apply a thin coat of Petrolatum (petroleum jelly, Vaseline) over the wound (unless you have an allergy to this). We recommend that you use a new, sterile tube of Vaseline. Do not pick or remove scabs. Do not remove the yellow or white healing tissue from the base of the wound.  Cover the wound with fresh, clean, nonstick gauze and secure with paper tape. You may use Band-Aids in place of gauze and tape if the wound is small enough, but would recommend trimming much of the tape off as there is often too much. Sometimes Band-Aids can irritate the skin.  You should call the office for your biopsy report after 1 week if you have not already been contacted.  If you experience any problems, such as abnormal amounts of bleeding, swelling, significant bruising, significant pain, or evidence of infection, please call the office immediately.  FOR ADULT SURGERY PATIENTS: If you need something for pain relief you may take 1 extra strength Tylenol  (acetaminophen ) AND 2 Ibuprofen (200mg  each) together every 4 hours as needed for pain. (do not take these if you are allergic to them or if you have a reason you should not take them.) Typically, you may only need pain medication for 1 to 3 days.     Melanoma ABCDEs  Melanoma is the most dangerous type of skin cancer, and is the leading cause of death from skin disease.  You are more likely to develop melanoma if you: Have light-colored skin, light-colored eyes, or red or blond hair Spend a lot of time in the sun Tan regularly, either outdoors or in a tanning bed Have had blistering sunburns, especially during childhood Have a close family member who has had a melanoma Have atypical moles or large birthmarks  Early detection of melanoma is key since treatment is typically straightforward and cure rates are extremely high if we catch it early.   The  first sign of melanoma is often a change in a mole or a new dark spot.  The ABCDE system is a way of remembering the signs of melanoma.  A for asymmetry:  The two halves do not match. B for border:  The edges of the growth are irregular. C for color:  A mixture of colors are present instead of an even brown color. D for diameter:  Melanomas are usually (but not always) greater than 6mm - the size of a pencil eraser. E for evolution:  The spot keeps changing in size, shape, and color.  Please check your skin once per month between visits. You can use a small mirror in front and a large mirror behind you to keep an eye on the back side or your body.   If you see any new or changing lesions before your next follow-up, please call to schedule a visit.  Please continue daily skin protection including broad spectrum sunscreen SPF 30+ to sun-exposed areas, reapplying every 2 hours as needed when you're outdoors.    Due to recent changes in healthcare laws, you may see results of your pathology and/or laboratory studies on MyChart before the doctors have had a chance to review them. We understand that in some cases there may be results that are confusing or concerning to you. Please understand that not all results are received at the same time and often the doctors may need to interpret multiple results in order to  provide you with the best plan of care or course of treatment. Therefore, we ask that you please give us  2 business days to thoroughly review all your results before contacting the office for clarification. Should we see a critical lab result, you will be contacted sooner.   If You Need Anything After Your Visit  If you have any questions or concerns for your doctor, please call our main line at [phone] and press option 4 to reach your doctor's medical assistant. If no one answers, please leave a voicemail as directed and we will return your call as soon as possible. Messages left after 4  pm will be answered the following business day.   You may also send us  a message via MyChart. We typically respond to MyChart messages within 1-2 business days.  For prescription refills, please ask your pharmacy to contact our office. Our fax number is [phone].  If you have an urgent issue when the clinic is closed that cannot wait until the next business day, you can page your doctor at the number below.    Please note that while we do our best to be available for urgent issues outside of office hours, we are not available 24/7.   If you have an urgent issue and are unable to reach us , you may choose to seek medical care at your doctor's office, retail clinic, urgent care center, or emergency room.  If you have a medical emergency, please immediately call 911 or go to the emergency department.  Pager Numbers  - Dr. Hester: [phone]  - Dr. Jackquline: [phone]  - Dr. Claudene: [phone]   - Dr. Raymund: [phone]  In the event of inclement weather, please call our main line at [phone] for an update on the status of any delays or closures.  Dermatology Medication Tips: Please keep the boxes that topical medications come in in order to help keep track of the instructions about where and how to use these. Pharmacies typically print the medication instructions only on the boxes and not directly on the medication tubes.   If your medication is too expensive, please contact our office at [phone] option 4 or send us  a message through MyChart.   We are unable to tell what your co-pay for medications will be in advance as this is different depending on your insurance coverage. However, we may be able to find a substitute medication at lower cost or fill out paperwork to get insurance to cover a needed medication.   If a prior authorization is required to get your medication covered by your insurance company, please allow us  1-2 business days to complete this  process.  Drug prices often vary depending on where the prescription is filled and some pharmacies may offer cheaper prices.  The website www.goodrx.com contains coupons for medications through different pharmacies. The prices here do not account for what the cost may be with help from insurance (it may be cheaper with your insurance), but the website can give you the price if you did not use any insurance.  - You can print the associated coupon and take it with your prescription to the pharmacy.  - You may also stop by our office during regular business hours and pick up a GoodRx coupon card.  - If you need your prescription sent electronically to a different pharmacy, notify our office through [facility] or by phone at [phone] option 4.     Si Usted Necesita Algo Despus de Su Visita  Tambin puede enviarnos un mensaje a travs de MyChart. Por lo general respondemos a los mensajes de MyChart en el transcurso de 1 a 2 das hbiles.  Para renovar recetas, por favor pida a su farmacia que se ponga en contacto con nuestra oficina. Randi lakes de fax es [place] [phone].  Si tiene un asunto urgente cuando la clnica est cerrada y que no puede esperar hasta el siguiente da hbil, puede llamar/localizar a su doctor(a) al nmero que aparece a continuacin.   Por favor, tenga en cuenta que aunque hacemos todo lo posible para estar disponibles para asuntos urgentes fuera del horario de [place], no estamos disponibles las 24 horas del da, los 7 [address]  [address] de la [place].   Si tiene un problema urgente y no puede comunicarse con nosotros, puede optar por buscar atencin mdica  en el consultorio de su doctor(a), en una clnica privada, en un centro de atencin urgente o en una sala de emergencias.  Si tiene Engineer, drilling, por favor llame inmediatamente al 911 o vaya a la sala de emergencias.  Nmeros de bper  - Dr. Hester: [phone]  - Dra. Jackquline: [phone]  - Dr.  Claudene: [phone]  - Dra. Kitts: [phone]  En caso de inclemencias del [place], por favor llame a nuestra lnea principal al [phone] para una actualizacin sobre el estado de cualquier retraso o cierre.  Consejos para la medicacin en dermatologa: Por favor, guarde las cajas en las que vienen los medicamentos de uso tpico para ayudarle a seguir las instrucciones sobre dnde y cmo usarlos. Las farmacias generalmente imprimen las instrucciones del medicamento slo en las cajas y no directamente en los tubos del [place].   Si su medicamento es muy caro, por favor, pngase en contacto con landry rieger llamando al [phone] y presione la opcin 4 o envenos un mensaje a travs de Clinical cytogeneticist.   No podemos decirle cul ser su copago por los medicamentos por adelantado ya que esto es diferente dependiendo de la cobertura de su seguro. Sin embargo, es posible que podamos encontrar un medicamento sustituto a Audiological scientist un formulario para que el seguro cubra el medicamento que se considera necesario.   Si se requiere una autorizacin previa para que su compaa de seguros malta su medicamento, por favor permtanos de 1 a 2 das hbiles para completar este proceso.  Los precios de los medicamentos varan con frecuencia dependiendo del Environmental consultant de dnde se surte la receta y alguna farmacias pueden ofrecer precios ms baratos.  El sitio web www.goodrx.com tiene cupones para medicamentos de Health and safety inspector. Los precios aqu no tienen en cuenta lo que podra costar con la ayuda del seguro (puede ser ms barato con su seguro), pero el sitio web puede darle el precio si no utiliz Tourist information centre manager.  - Puede imprimir el cupn correspondiente y llevarlo con su receta a la farmacia.  - Tambin puede pasar por nuestra oficina durante el horario de atencin regular y Education officer, museum una tarjeta de cupones de GoodRx.  - Si necesita que su receta se enve electrnicamente a una farmacia diferente,  informe a nuestra oficina a travs de MyChart de [place] o por telfono llamando al [phone] y presione la opcin 4.

## 2023-12-04 NOTE — Progress Notes (Signed)
 Follow-Up Visit   Subjective  Larry Lane is a 83 y.o. male who presents for the following: Skin Cancer Screening and Full Body Skin Exam  The patient presents for Total-Body Skin Exam (TBSE) for skin cancer screening and mole check. The patient has spots, moles and lesions to be evaluated, some may be new or changing and the patient may have concern these could be cancer.  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 Few spots at frontal scalp that patient was told by Dr. Corey he should have checked.  Patient accompanied by wife who contributes to history.  The following portions of the chart were reviewed this encounter and updated as appropriate: medications, allergies, medical history  Review of Systems:  No other skin or systemic complaints except as noted in HPI or Assessment and Plan.  Objective  Well appearing patient in no apparent distress; mood and affect are within normal limits.  A full examination was performed including scalp, head, eyes, ears, nose, lips, neck, chest, axillae, abdomen, back, buttocks, bilateral upper extremities, bilateral lower extremities, hands, feet, fingers, toes, fingernails, and toenails. All findings within normal limits unless otherwise noted below.   Relevant physical exam findings are noted in the Assessment and Plan.  Right Postauricular Neck 1 cm pink plaque with hemorrhagic crusting  Mid Frontal Scalp Anterior 1.5 cm hyperkeratotic plaque  Mid Frontal Scalp Posterior 7 mm hyperkeratotic papule  Central Occipital Scalp 1.1 cm hyperkeratotic plaque  Left Occipital Scalp 1.2 cm pink plaque with hemorrhagic crusting  Left Upper Back 7 mm irregular pigmented macule adjacent to scar   Assessment & Plan   SKIN CANCER SCREENING PERFORMED TODAY.  ACTINIC DAMAGE - Chronic condition, secondary to cumulative UV/sun exposure - diffuse scaly erythematous macules with underlying dyspigmentation - Recommend daily broad  spectrum sunscreen SPF 30+ to sun-exposed areas, reapply every 2 hours as needed.  - Staying in the shade or wearing long sleeves, sun glasses (UVA+UVB protection) and wide brim hats (4-inch brim around the entire circumference of the hat) are also recommended for sun protection.  - Call for new or changing lesions.  LENTIGINES, SEBORRHEIC KERATOSES, HEMANGIOMAS - Benign normal skin lesions - Benign-appearing - Call for any changes  MELANOCYTIC NEVI - Tan-brown and/or pink-flesh-colored symmetric macules and papules - Benign appearing on exam today - Observation - Call clinic for new or changing moles - Recommend daily use of broad spectrum spf 30+ sunscreen to sun-exposed areas.   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  - Call if any new or changing lesions are noted between office visits  NEOPLASM OF UNCERTAIN BEHAVIOR OF SKIN (6) Right Postauricular Neck Skin / nail biopsy Type of biopsy: tangential   Informed consent: discussed and consent obtained   Timeout: patient name, date of birth, surgical site, and procedure verified   Procedure prep:  Patient was prepped and draped in usual sterile fashion Prep type:  Isopropyl alcohol Anesthesia: the lesion was anesthetized in a standard fashion   Anesthetic:  1% lidocaine  w/ epinephrine  1-100,000 buffered w/ 8.4% NaHCO3 Instrument used: DermaBlade   Hemostasis achieved with: pressure and aluminum chloride   Outcome: patient tolerated procedure well   Post-procedure details: sterile dressing applied and wound care instructions given   Dressing  type: bandage and petrolatum    Specimen 1 - Surgical pathology Differential Diagnosis: BCC vs SCC  Check Margins: No Mid Frontal Scalp Anterior Skin / nail  biopsy Type of biopsy: tangential   Informed consent: discussed and consent obtained   Timeout: patient name, date of birth, surgical site, and procedure verified   Procedure prep:  Patient was prepped and draped in usual sterile fashion Prep type:  Isopropyl alcohol Anesthesia: the lesion was anesthetized in a standard fashion   Anesthetic:  1% lidocaine  w/ epinephrine  1-100,000 buffered w/ 8.4% NaHCO3 Instrument used: DermaBlade   Hemostasis achieved with: pressure and aluminum chloride   Outcome: patient tolerated procedure well   Post-procedure details: sterile dressing applied and wound care instructions given   Dressing type: bandage and petrolatum    Specimen 2 - Surgical pathology Differential Diagnosis: BCC vs SCC  Check Margins: No Mid Frontal Scalp Posterior Skin / nail biopsy Type of biopsy: tangential   Informed consent: discussed and consent obtained   Timeout: patient name, date of birth, surgical site, and procedure verified   Procedure prep:  Patient was prepped and draped in usual sterile fashion Prep type:  Isopropyl alcohol Anesthesia: the lesion was anesthetized in a standard fashion   Anesthetic:  1% lidocaine  w/ epinephrine  1-100,000 buffered w/ 8.4% NaHCO3 Instrument used: DermaBlade   Hemostasis achieved with: pressure and aluminum chloride   Outcome: patient tolerated procedure well   Post-procedure details: sterile dressing applied and wound care instructions given   Dressing type: bandage and petrolatum    Specimen 3 - Surgical pathology Differential Diagnosis: BCC vs SCC  Check Margins: No Central Occipital Scalp Skin / nail biopsy Type of biopsy: tangential   Informed consent: discussed and consent obtained   Timeout: patient name, date of birth, surgical site, and procedure verified   Procedure prep:  Patient was prepped and draped in usual sterile fashion Prep type:  Isopropyl alcohol Anesthesia: the lesion was anesthetized in a standard  fashion   Anesthetic:  1% lidocaine  w/ epinephrine  1-100,000 buffered w/ 8.4% NaHCO3 Instrument used: DermaBlade   Hemostasis achieved with: pressure and aluminum chloride   Outcome: patient tolerated procedure well   Post-procedure details: sterile dressing applied and wound care instructions given   Dressing type: bandage and petrolatum    Specimen 4 - Surgical pathology Differential Diagnosis: BCC vs SCC  Check Margins: No Left Occipital Scalp Skin / nail biopsy Type of biopsy: tangential   Informed consent: discussed and consent obtained   Timeout: patient name, date of birth, surgical site, and procedure verified   Procedure prep:  Patient was prepped and draped in usual sterile fashion Prep type:  Isopropyl alcohol Anesthesia: the lesion was anesthetized in a standard fashion   Anesthetic:  1% lidocaine  w/ epinephrine  1-100,000 buffered w/ 8.4% NaHCO3 Instrument used: DermaBlade   Hemostasis achieved with: pressure and aluminum chloride   Outcome: patient tolerated procedure well   Post-procedure details: sterile dressing applied and wound care instructions given   Dressing type: bandage and petrolatum    Specimen 5 - Surgical pathology Differential Diagnosis: BCC vs SCC  Check Margins: No Left Upper Back Skin / nail biopsy Type of biopsy: tangential   Informed consent: discussed and consent obtained   Timeout: patient name, date of birth, surgical site, and procedure verified   Procedure prep:  Patient was prepped and draped in usual sterile fashion Prep type:  Isopropyl alcohol Anesthesia: the lesion was anesthetized in a standard fashion   Anesthetic:  1% lidocaine  w/ epinephrine  1-100,000 buffered w/ 8.4% NaHCO3 Instrument used: DermaBlade   Hemostasis achieved with: pressure and aluminum chloride   Outcome: patient tolerated procedure well   Post-procedure details: sterile dressing applied and wound care instructions given   Dressing type: bandage and petrolatum     Specimen 6 - Surgical pathology Differential Diagnosis: Dysplastic Nevus vs Melanoma  Check Margins: No MULTIPLE BENIGN NEVI   LENTIGINES   ACTINIC ELASTOSIS   SEBORRHEIC KERATOSES   CHERRY ANGIOMA   Return in about 3 months (around 03/05/2024) for TBSE, with Dr. Claudene, HxSCC.  Larry Lane, RMA, am acting as scribe for Boneta Claudene, MD .   Documentation: I have reviewed the above documentation for accuracy and completeness, and I agree with the above.  Boneta Claudene, MD

## 2023-12-06 ENCOUNTER — Ambulatory Visit: Payer: Self-pay | Admitting: Dermatology

## 2023-12-06 DIAGNOSIS — C4492 Squamous cell carcinoma of skin, unspecified: Secondary | ICD-10-CM

## 2023-12-06 LAB — SURGICAL PATHOLOGY

## 2023-12-07 ENCOUNTER — Encounter: Payer: Self-pay | Admitting: Dermatology

## 2023-12-07 NOTE — Telephone Encounter (Signed)
 Reviewed pathology results with patient's wife. She will discuss with him and let us  know if he prefers to treat SCCis x 3 with 5FU/cal or EDC. Referral x 2 sent to Dr. Corey. Lonell RAMAN., RMA

## 2023-12-07 NOTE — Telephone Encounter (Signed)
-----   Message from [facility] sent at 12/06/2023  9:19 PM EDT ----- Diagnosis 1. Skin, right postauricular neck :      MODERATELY DIFFERENTIATED SQUAMOUS CELL CARCINOMA       2. Skin, mid frontal scalp anterior :      WELL DIFFERENTIATED SQUAMOUS CELL CARCINOMA       3. Skin, mid frontal scalp posterior :      SQUAMOUS CELL CARCINOMA IN SITU       4. Skin, central occipital scalp :      SQUAMOUS CELL CARCINOMA IN SITU       5. Skin, left occipital scalp :      SQUAMOUS CELL CARCINOMA IN SITU, HYPERTROPHIC, EXCORIATED, CRUSTED       6. Skin, left upper back :      DYSPLASTIC COMPOUND NEVUS WITH MODERATE ATYPIA, LIMITED MARGINS FREE    Please call  R postauricular neck Biopsy shows a moderately aggressive invasive squamous cell skin cancer. It has the potential to spread beyond the skin and threaten your health, so we recommend treating it. Treatment: Mohs and top priority  2. mid frontal scalp anterior  Biopsy shows an invasive squamous cell skin cancer. It has the potential to spread beyond the skin and threaten your health, so we recommend treating it. Treatment: Mohs  3. mid frontal scalp posterior Biopsy shows SCCis Treatment: 5FU/calcip BID up to 10 days and stop early when reaction begins vs EDC  4. central occipital scalp  Biopsy shows SCCis Treatment: 5FU/calcip BID up to 10 days and stop early when reaction begins vs EDC  5. Left occipital scalp Biopsy shows SCCis Treatment: 5FU/calcip BID up to 10 days and stop early when reaction begins vs EDC  6. L upper back Biopsy show a moderately abnormal mole. This is benign and no treatment is needed    ----- Message ----- From: Interface, Lab In [facility] Sent: 12/06/2023   3:39 PM EDT To: Boneta Sharps, MD

## 2023-12-08 ENCOUNTER — Encounter: Payer: Self-pay | Admitting: Dermatology

## 2023-12-11 ENCOUNTER — Encounter: Payer: Self-pay | Admitting: Dermatology

## 2023-12-11 ENCOUNTER — Other Ambulatory Visit: Payer: Self-pay

## 2023-12-11 MED ORDER — FLUOROURACIL 5 % EX CREA: TOPICAL_CREAM | Freq: Two times a day (BID) | CUTANEOUS | 2 refills | Status: AC

## 2023-12-19 ENCOUNTER — Ambulatory Visit: Admitting: Dermatology

## 2023-12-28 ENCOUNTER — Ambulatory Visit (INDEPENDENT_AMBULATORY_CARE_PROVIDER_SITE_OTHER): Admitting: Dermatology

## 2023-12-28 ENCOUNTER — Encounter: Payer: Self-pay | Admitting: Dermatology

## 2023-12-28 VITALS — BP 136/81 | HR 72 | Temp 98.4°F

## 2023-12-28 DIAGNOSIS — L578 Other skin changes due to chronic exposure to nonionizing radiation: Secondary | ICD-10-CM | POA: Diagnosis not present

## 2023-12-28 DIAGNOSIS — L814 Other melanin hyperpigmentation: Secondary | ICD-10-CM | POA: Diagnosis not present

## 2023-12-28 DIAGNOSIS — T1490XD Injury, unspecified, subsequent encounter: Secondary | ICD-10-CM

## 2023-12-28 DIAGNOSIS — L905 Scar conditions and fibrosis of skin: Secondary | ICD-10-CM | POA: Diagnosis not present

## 2023-12-28 DIAGNOSIS — C4442 Squamous cell carcinoma of skin of scalp and neck: Secondary | ICD-10-CM | POA: Diagnosis not present

## 2023-12-28 DIAGNOSIS — C4492 Squamous cell carcinoma of skin, unspecified: Secondary | ICD-10-CM

## 2023-12-28 NOTE — Progress Notes (Signed)
 Follow-Up Visit   Subjective  Larry Lane is a 83 y.o. 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 Claudene.   He is s/p Mohs for an SCC on his scalp, treated on 11/21/2023, healing by second intention.  The following portions of the chart were reviewed this encounter and updated as appropriate: medications, allergies, medical history  Review of Systems:  No other skin or systemic complaints except as noted in HPI or Assessment and Plan.  Objective  Well appearing patient in no apparent distress; mood and affect are within normal limits.  A focused examination was performed of the following areas: Right postauricular neck Relevant physical exam findings are noted in the Assessment and Plan.   Right Postauricular Neck Pink scaly nodule   Assessment & Plan   SQUAMOUS CELL CARCINOMA OF SKIN Right Postauricular Neck Mohs surgery  Consent obtained: written  Anticoagulation: Is the patient taking prescription anticoagulant and/or aspirin  prescribed/recommended by a physician? Yes   Was the anticoagulation regimen changed prior to Mohs? No    Anesthesia: Anesthesia method: local infiltration Local anesthetic: lidocaine  1% WITH epi  Procedure Details: Timeout: pre-procedure verification complete Procedure Prep: patient was prepped and draped in usual sterile fashion Prep type: chlorhexidine  Biopsy accession number: [id number] Pre-Op diagnosis: squamous cell carcinoma SCC subtype: moderately differentiated MohsAIQ Surgical site (if tumor spans multiple areas, please select predominant area): neck Surgery side: right Surgical site (from skin exam): Right Postauricular Neck Pre-operative length (cm): 1 Pre-operative width (cm): 1 Indications for Mohs surgery: anatomic location where tissue conservation is critical  Micrographic Surgery Details: Post-operative  length (cm): 2.2 Post-operative width (cm): 1.8 Number of Mohs stages: 2 Post surgery depth of defect: subcutaneous fat  Stage 1    Tumor features identified on Mohs section: squamous cell carcinoma  Stage 2    Tumor features identified on Mohs section: no tumor identified    Depth of tumor invasion after stage: dermis    Perineural invasion: no perineural invasion  Patient tolerance of procedure: tolerated well, no immediate complications  Reconstruction: Was the defect reconstructed? Yes   Was reconstruction performed by the same Mohs surgeon? Yes   Setting of reconstruction: outpatient office When was reconstruction performed? same day Type of reconstruction: linear Linear reconstruction: complex  Opioids: Did the patient receive a prescription for opioid/narcotic related to Mohs surgery?: No    Antibiotics: Does patient meet AHA guidelines for endocarditis?: No   Does patient meet AHA guidelines for orthopedic prophylaxis?: No   Were antibiotics given on the day of surgery?: No   Did surgery breach mucosa, expose cartilage/bone, involve an area of lymphedema/inflamed/infected tissue? No    Skin repair Complexity:  Complex Final length (cm):  5.2 Informed consent: discussed and consent obtained   Timeout: patient name, date of birth, surgical site, and procedure verified   Procedure prep:  Patient was prepped and draped in usual sterile fashion Prep type:  Chlorhexidine  Anesthesia: the lesion was anesthetized in a standard fashion   Reason for type of repair: reduce the risk of dehiscence, infection, and necrosis, preserve normal anatomical and functional relationships, avoid adjacent structures and allow side-to-side closure without requiring a flap or graft   Undermining: area extensively undermined   Subcutaneous layers (deep stitches):  Suture size:  4-0 Suture type: Vicryl (polyglactin 910)   Stitches:  Buried vertical mattress Fine/surface layer approximation  (top stitches):  Suture size:  6-0  Suture type: fast-absorbing plain gut   Stitches: simple running   Hemostasis achieved with: suture, pressure and electrodesiccation Outcome: patient tolerated procedure well with no complications   Post-procedure details: sterile dressing applied and wound care instructions given   Dressing type: pressure dressing and petrolatum     Well Differentiated Squamous Cell Carcinoma Exam: mid frontal scalp anterior  Treatment Plan: Mohs scheduled 01/11/2024  Healing Wound s/p Mohs for SCC on the scalp, treated on 11/21/2023, healing by second intention - Reassured that wound is healing well - Continue daily vaseline with occlusion - Discussed that scars take up to 12 months to mature from the date of surgery - Recommend SPF 30+ to scar daily to prevent purple color - OK to start scar massage at 4-6 weeks post-op - Can consider silicone based products for scar healing  Return in about 4 weeks (around 01/25/2024) for Wound Check.   12/28/2023  HISTORY OF PRESENT ILLNESS  Larry Lane is seen in consultation at the request of Dr. Claudene for biopsy-proven Moderately Differentiated Squamous Cell Carcinoma on the right postauricular neck. They note that the area has been present for about 4 months increasing in size with time.  There is no history of previous treatment.  Reports no other new or changing lesions and has no other complaints today.  Medications and allergies: see patient chart.  Review of systems: Reviewed 8 systems and notable for the above skin cancer.  All other systems reviewed are unremarkable/negative, unless noted in the HPI. Past medical history, surgical history, family history, social history were also reviewed and are noted in the chart/questionnaire.    PHYSICAL EXAMINATION  General: Well-appearing, in no acute distress, alert and oriented x 4. Vitals reviewed in chart (if available).   Skin: Exam reveals a 1.0 x 1.0 cm  erythematous papule and biopsy scar on the postauricular neck. There are rhytids, telangiectasias, and lentigines, consistent with photodamage.  Biopsy report(s) reviewed, confirming the diagnosis.   ASSESSMENT  1) Moderately Differentiated Squamous Cell Carcinoma of the right postauricular neck 2) photodamage 3) solar lentigines   PLAN   1. Due to location, size, histology, or recurrence and the likelihood of subclinical extension as well as the need to conserve normal surrounding tissue, the patient was deemed acceptable for Mohs micrographic surgery (MMS).  The nature and purpose of the procedure, associated benefits and risks including recurrence and scarring, possible complications such as pain, infection, and bleeding, and alternative methods of treatment if appropriate were discussed with the patient during consent. The lesion location was verified by the patient, by reviewing previous notes, pathology reports, and by photographs as well as angulation measurements if available.  Informed consent was reviewed and signed by the patient, and timeout was performed at 8:30 AM. See op note below.  2. For the photodamage and solar lentigines, sun protection discussed/information given on OTC sunscreens, and we recommend continued regular follow-up with primary dermatologist every 6 months or sooner for any growing, bleeding, or changing lesions. 3. Prognosis and future surveillance discussed. 4. Letter with treatment outcome sent to referring provider. 5. Pain acetaminophen /ibuprofen  MOHS MICROGRAPHIC SURGERY AND RECONSTRUCTION  Initial size:   1.0 x 1.0 cm Surgical defect/wound size: 2.2 x 1.8 cm Anesthesia:    0.33% lidocaine  with 1:200,000 epinephrine  EBL:    <5 mL Complications:  None Repair type:   Complex SQ suture:   4-0 Monocryl, 4-0 Vicryl Cutaneous suture:  6-0 Plain gut Final size of the repair: 5.2 cm  Stages: 2  STAGE I: Anesthesia achieved with 0.5% lidocaine  with  1:200,000 epinephrine . ChloraPrep applied. 1 section(s) excised using Mohs technique (this includes total peripheral and deep tissue margin excision and evaluation with frozen sections, excised and interpreted by the same physician). The tumor was first debulked and then excised with an approx. 2 mm margin.  Hemostasis was achieved with electrocautery as needed.  The specimen was then oriented, subdivided/relaxed, inked, and processed using Mohs technique.    Frozen section analysis revealed a positive margin for full thickness epidermal architectural and cellular atypia, apoptotic cells, individual cell dyskeratosis with markedly altered maturation but usually still some surface keratinization and intercellular bridges present with marked nuclear atypia, including nuclear hyperchromasia and multinucleation in the peripheral margin.    STAGE II: An additional 2 mm margin was excised.  Hemostasis was achieved with electrocautery as needed.  The specimen was then oriented, subdivided/relaxed, inked, and processed using Mohs technique. Evaluation of slides by the Mohs surgeon revealed clear tumor margins.  Reconstruction  The surgical wound was then cleaned, prepped, and re-anesthetized as above. Wound edges were undermined extensively along at least one entire edge and at a distance equal to or greater than the width of the defect (see wound defect size above) in order to achieve closure and decrease wound tension and anatomic distortion. Redundant tissue repair including standing cone removal was performed. Hemostasis was achieved with electrocautery. Subcutaneous and epidermal tissues were approximated with the above sutures. The surgical site was then lightly scrubbed with sterile, saline-soaked gauze. The area was then bandaged using Vaseline ointment, non-adherent gauze, gauze pads, and tape to provide an adequate pressure dressing. The patient tolerated the procedure well, was given detailed written and  verbal wound care instructions, and was discharged in good condition.   The patient will follow-up: 4 weeks.   I, Darice Smock, CMA, am acting as scribe for RUFUS CHRISTELLA HOLY, MD.  [name] Rollene Gobble, RN, am acting as scribe for RUFUS CHRISTELLA HOLY, MD .   Documentation: I have reviewed the above documentation for accuracy and completeness, and I agree with the above.  RUFUS CHRISTELLA HOLY, MD

## 2023-12-28 NOTE — Patient Instructions (Signed)

## 2024-01-04 ENCOUNTER — Telehealth: Payer: Self-pay

## 2024-01-04 NOTE — Telephone Encounter (Signed)
 Called patient, spoke with wife. She states the patient has increased swelling and has been passing out while lying in bed, BP has been good - 132/82 in PCP office yesterday. No falls with passing out, but this has been happening x 2 weeks. Scheduled OV with [facility], advised wife on ER precautions - verbalized understanding and agreement with plan

## 2024-01-05 ENCOUNTER — Encounter: Payer: Self-pay | Admitting: Cardiology

## 2024-01-05 ENCOUNTER — Ambulatory Visit: Attending: Cardiology | Admitting: Cardiology

## 2024-01-05 VITALS — BP 140/76 | HR 65 | Ht 64.0 in | Wt 195.4 lb

## 2024-01-05 DIAGNOSIS — R600 Localized edema: Secondary | ICD-10-CM | POA: Diagnosis not present

## 2024-01-05 DIAGNOSIS — I10 Essential (primary) hypertension: Secondary | ICD-10-CM | POA: Diagnosis not present

## 2024-01-05 MED ORDER — FUROSEMIDE 40 MG PO TABS: 40.0000 mg | ORAL_TABLET | Freq: Every day | ORAL | 3 refills | Status: AC

## 2024-01-05 NOTE — Patient Instructions (Signed)
 Medication Instructions:  - STOP amlodipine  - START lasix 40 mg daily   *If you need a refill on your cardiac medications before your next appointment, please call your pharmacy*  Lab Work: Your provider would like for you to return in 2 weeks to have the following labs drawn: BMP.   Please go to [facility] [address] ([facility] [address], [place] [address] You do not need an appointment.  They are open from 8 am- 4:30 pm.  Lunch from 1:00 pm- 2:00 pm You do not need to be fasting.   You may also go to one of the following [facility]:  [address] [place], [place] [address] Phone: [phone] Lab hours: Mon-Fri 8 am- 5 pm    Lunch 12 pm- 1 pm  [address] [place],  [place]  [address]  [place]  Phone: [phone] Lab hours: 7 am- 4 pm Lunch 12 pm-1 pm   [address] [place],  [place]  [address]  [place]  Phone: [phone] Lab hours: Mon-Fri 8 am- 5 pm    Lunch 12 pm- 1 pm  If you have labs (blood work) drawn today and your tests are completely normal, you will receive your results only by: MyChart Message (if you have MyChart) OR A paper copy in the mail If you have any lab test that is abnormal or we need to change your treatment, we will call you to review the results.  Testing/Procedures: No test ordered today   Follow-Up: At [facility], you and your health needs are our priority.  As part of our continuing mission to provide you with exceptional heart care, our providers are all part of one team.  This team includes your primary Cardiologist (physician) and Advanced Practice Providers or APPs (Physician Assistants and Nurse Practitioners) who all work together to provide you with the care you need, when you need it.  Your next appointment:   6 week(s)  Provider:   You may see Deatrice Cage, MD or one of the following Advanced Practice Providers on your designated Care Team:   Lonni Meager, NP Lesley Maffucci, PA-C Bernardino Bring,  PA-C Cadence Catron, PA-C Tylene Lunch, NP Barnie Hila, NP    We recommend signing up for the patient portal called MyChart.  Sign up information is provided on this After Visit Summary.  MyChart is used to connect with patients for Virtual Visits (Telemedicine).  Patients are able to view lab/test results, encounter notes, upcoming appointments, etc.  Non-urgent messages can be sent to your provider as well.   To learn more about what you can do with MyChart, go to [email or web address].

## 2024-01-05 NOTE — Progress Notes (Signed)
 Cardiology Office Note:    Date:  01/05/2024   ID:  Larry Lane, DOB 10/16/1940, MRN [id number]  PCP:  Larry Lynwood FALCON, MD   [facility] Providers Cardiologist:  Larry Cage, MD     Referring MD: Larry Lynwood FALCON, MD   Chief Complaint  Patient presents with   Follow-up     follow up / pt has been passing out while in the bed. The pt doing well with no complaints of chest pain, chest pressure or SOB, medication reviewed verbally with patient.Swelling bi lateral extremities     History of Present Illness:    Larry Lane is a 83 y.o. male with a hx of hypertension, hyperlipidemia, PVCs, diabetes presenting with leg edema and passing out while lying in bed.  States having worsening leg edema over the past 2 to 3 weeks.  Endorses eating lots of salty foods, drinks diet Pepsi.  Not sure if he passed out a week ago while laying in bed in the evening or he fell asleep.  Denies palpitations.  Followed up with PCP, started on Lasix 20 mg daily x 3 days, without any significant improvement in leg edema.  Patient has a history of presyncope, orthostatic dizziness and difficulty ambulating hospitalized in December 2024.  Symptoms of dizziness improved after decreasing dose of losartan .  Also takes Norvasc  for BP control.  Prior notes/testing Cardiac PET scan 1/25 with no evidence of ischemia Limited echo 12/24 EF 60 to 65%, aortic valve sclerosis  Past Medical History:  Diagnosis Date   Abscess of back    Arthritis    Bacterial infection 2015   Cancer (HCC)    skin    Diabetes mellitus without complication (HCC)    Dysplastic nevus 12/04/2023   left upper back, moderately atypia   Dysrhythmia    Edema    RIGHT ANKLE   GERD (gastroesophageal reflux disease)    Hypertension    SCC (squamous cell carcinoma) 10/19/2023   scalp, tx Mohs with Dr. 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   left occipital scalp, 5FU/cal vs EDC   Trigeminal nerve disorder    Trigeminal neuralgia     Past Surgical History:  Procedure Laterality Date   APPENDECTOMY  2010   CATARACT EXTRACTION W/PHACO Right 01/26/2017   Procedure: CATARACT EXTRACTION PHACO AND INTRAOCULAR LENS PLACEMENT (IOC);  Surgeon: Larry Adine Anes, MD;  Location: [facility] ORS;  Service: Ophthalmology;  Laterality: Right;  Lot # [id number] H US : 00:48.2 AP%: 9.9 CDE: 5.35   I&D of back abscess     TRIGEMINAL NERVE DECOMPRESSION      Current Medications: Current Meds  Medication Sig   furosemide (LASIX) 40 MG tablet Take 1 tablet (40 mg total) by mouth daily.     Allergies:   Patient has no known allergies.   Social History   Socioeconomic History   Marital status: Married    Spouse name: Not on file   Number of children: Not on file   Years of education: Not on file   Highest education level: Not on file  Occupational History   Not on file  Tobacco Use   Smoking status: Never   Smokeless tobacco: Never  Vaping Use   Vaping status: Never Used  Substance and Sexual Activity   Alcohol use: No    Alcohol/week: 0.0 standard drinks of alcohol   Drug use: No   Sexual activity: Not on file  Other Topics Concern   Not on file  Social History Narrative   Not on file   Social Drivers of Health   Financial Resource Strain: Patient Declined (11/16/2022)   Received from [facility]   Overall Financial Resource Strain (CARDIA)    Difficulty of Paying Living Expenses: Patient declined  Food Insecurity: No Food Insecurity (01/29/2023)   Hunger Vital Sign    Worried About Running  Out of Food in the Last Year: Never true    Ran Out of Food in the Last Year: Never true  Transportation Needs: No Transportation Needs (01/29/2023)   PRAPARE - Administrator, Civil Service (Medical): No    Lack of Transportation (Non-Medical): No  Physical Activity: Not on file  Stress: Not on file  Social Connections: Not on file     Family History: The patient's family history includes Heart disease in his father.  ROS:   Please see the history of present illness.     All other systems reviewed and are negative.  EKGs/Labs/Other Studies Reviewed:    The following studies were reviewed today:  EKG Interpretation Date/Time:  Friday January 05 2024 15:22:01 EST Ventricular Rate:  65 PR Interval:  170 QRS Duration:  82 QT Interval:  398 QTC Calculation: 413 R Axis:   20  Text Interpretation: Normal sinus rhythm with sinus arrhythmia Normal ECG Confirmed by Larry Lane ([id number]) on 01/05/2024 3:30:30 PM    Recent Labs: 01/29/2023: TSH 1.426 01/30/2023: Magnesium  1.9 02/27/2023: ALT 25; BUN 10; Creatinine, Ser 0.95; Hemoglobin 13.1; Platelets 331; Potassium 3.8; Sodium 128  Recent Lipid Panel No results found for: CHOL, TRIG, HDL, CHOLHDL, VLDL, LDLCALC, LDLDIRECT   Risk Assessment/Calculations:             Physical Exam:    VS:  BP (!) 140/76 (BP Location: Left Arm, Patient Position: Sitting, Cuff Size: Normal)   Pulse 65   Ht 5' 4 (1.626 m)   Wt 195 lb 6.4 oz (88.6 kg)   SpO2 97%   BMI 33.54 kg/m     Wt Readings from Last 3 Encounters:  01/05/24 195 lb 6.4 oz (88.6 kg)  06/27/23 191 lb 9.6 oz (86.9 kg)  03/16/23 171 lb (77.6 kg)     GEN:  Well nourished, well developed in no acute distress HEENT: Normal NECK: No JVD; No carotid bruits LYMPHATICS: No lymphadenopathy CARDIAC: RRR, 2/6 systolic murmur RESPIRATORY:  Clear to auscultation without rales, wheezing or rhonchi  ABDOMEN: Soft, non-tender,  non-distended MUSCULOSKELETAL:  2+ edema; No deformity  SKIN: Warm and dry NEUROLOGIC:  Alert and oriented x 3 PSYCHIATRIC:  Normal affect   ASSESSMENT:    1. Bilateral leg edema   2. Hypertension, unspecified type    PLAN:    In order of problems listed above:  Bilateral leg edema, possibly from heavy/increased salt intake.  Low-salt diet advised.  Increase Lasix to 40 mg daily.  Check BMP in 10 days.  Stop Norvasc  with Increasing Lasix dose. Patient with history of presyncope, orthostasis. Hypertension, okay to let BP run high, systolic in 140s to 150s okay.  History of  presyncope, orthostasis.  Continue losartan  25 mg daily, Lasix 40 mg daily as above.  Titrate losartan  as needed for adequate BP control.  Follow-up in 6 weeks.     Medication Adjustments/Labs and Tests Ordered: Current medicines are reviewed at length with the patient today.  Concerns regarding medicines are outlined above.  Orders Placed This Encounter  Procedures   Basic metabolic panel with GFR   EKG 87-Ozji   Meds ordered this encounter  Medications   furosemide (LASIX) 40 MG tablet    Sig: Take 1 tablet (40 mg total) by mouth daily.    Dispense:  90 tablet    Refill:  3    Patient Instructions  Medication Instructions:  - STOP amlodipine  - START lasix 40 mg daily   *If you need a refill on your cardiac medications before your next appointment, please call your pharmacy*  Lab Work: Your provider would like for you to return in 2 weeks to have the following labs drawn: BMP.   Please go to [facility] [address] ([facility] [address], [place] [address] You do not need an appointment.  They are open from 8 am- 4:30 pm.  Lunch from 1:00 pm- 2:00 pm You do not need to be fasting.   You may also go to one of the following [facility]:  [address] [place], [place] [address] Phone: [phone] Lab hours: Mon-Fri 8 am- 5 pm    Lunch 12 pm- 1 pm  [address] [place],  [place]  [address]  [place]  Phone: [phone] Lab hours: 7 am- 4 pm Lunch 12 pm-1 pm   [address] [place],  [place]  [address]  [place]  Phone: [phone] Lab hours: Mon-Fri 8 am- 5 pm    Lunch 12 pm- 1 pm  If you have labs (blood work) drawn today and your tests are completely normal, you will receive your results only by: MyChart Message (if you have MyChart) OR A paper copy in the mail If you have any lab test that is abnormal or we need to change your treatment, we will call you to review the results.  Testing/Procedures: No test ordered today   Follow-Up: At [facility], you and your health needs are our priority.  As part of our continuing mission to provide you with exceptional heart care, our providers are all part of one team.  This team includes your primary Cardiologist (physician) and Advanced Practice Providers or APPs (Physician Assistants and Nurse Practitioners) who all work together to provide you with the care you need, when you need it.  Your next appointment:   6 week(s)  Provider:   You may see Larry Cage, MD or one of the following Advanced Practice Providers on your designated Care Team:   Lonni Meager, NP Lesley Maffucci, PA-C Bernardino Bring, PA-C Cadence Ridgeway, PA-C Tylene Lunch, NP Barnie Hila, NP    We recommend signing up for the patient portal called MyChart.  Sign up information is provided on this After Visit Summary.  MyChart is used to connect with patients for Virtual Visits (Telemedicine).  Patients are able to view lab/test results, encounter notes, upcoming appointments, etc.  Non-urgent messages can be sent to your provider as well.   To learn more about what you can do with MyChart, go to [email or web address].            Signed, Redell Cave, MD  01/05/2024 4:09 PM    [facility]

## 2024-01-10 ENCOUNTER — Telehealth: Payer: Self-pay

## 2024-01-10 NOTE — Telephone Encounter (Signed)
 The patient had called our office earlier today confused about the site of his upcoming surgery, thinking that it had already been treated. I called the patient back and reached his wife. We discussed that the location of his surgery is on the front of his scalp, and the efudex that he is currently using for his other lesions would not treat this cancer. The wife states that she is confused as to where she is suppose to be applying the cream, and is worried that she is not putting it on the correct sites. She would like to be seen in person so that the locations that she is suppose to be putting the cream could be pointed out. I advised that I would reach out to her dermatology office so they could reach out to her.

## 2024-01-10 NOTE — Telephone Encounter (Signed)
 Spoke with patient's wife and reviewed areas to be treated with 5FU. Recommend they come by the office tomorrow between 11:45 am and noon and ask for me so I can mark the spots for them according to photos we took at time of biopsies. Lonell RAMAN., RMA

## 2024-01-11 ENCOUNTER — Encounter: Admitting: Dermatology

## 2024-01-15 ENCOUNTER — Other Ambulatory Visit: Payer: Self-pay | Admitting: *Deleted

## 2024-01-15 DIAGNOSIS — I10 Essential (primary) hypertension: Secondary | ICD-10-CM

## 2024-01-16 LAB — BASIC METABOLIC PANEL WITH GFR
BUN/Creatinine Ratio: 13 (ref 10–24)
BUN: 13 mg/dL (ref 8–27)
CO2: 25 mmol/L (ref 20–29)
Calcium: 9 mg/dL (ref 8.6–10.2)
Chloride: 97 mmol/L (ref 96–106)
Creatinine, Ser: 1.01 mg/dL (ref 0.76–1.27)
Glucose: 227 mg/dL — ABNORMAL HIGH (ref 70–99)
Potassium: 4.8 mmol/L (ref 3.5–5.2)
Sodium: 133 mmol/L — ABNORMAL LOW (ref 134–144)
eGFR: 75 mL/min/1.73 (ref >59)

## 2024-01-22 ENCOUNTER — Ambulatory Visit: Payer: Self-pay | Admitting: Cardiology

## 2024-01-23 ENCOUNTER — Encounter: Payer: Self-pay | Admitting: Cardiovascular Disease

## 2024-01-23 ENCOUNTER — Ambulatory Visit: Attending: Cardiovascular Disease | Admitting: Cardiovascular Disease

## 2024-01-23 VITALS — BP 160/80 | HR 70 | Ht 64.0 in | Wt 194.8 lb

## 2024-01-23 DIAGNOSIS — I10 Essential (primary) hypertension: Secondary | ICD-10-CM | POA: Diagnosis not present

## 2024-01-23 DIAGNOSIS — E785 Hyperlipidemia, unspecified: Secondary | ICD-10-CM

## 2024-01-23 DIAGNOSIS — I5032 Chronic diastolic (congestive) heart failure: Secondary | ICD-10-CM | POA: Diagnosis not present

## 2024-01-23 DIAGNOSIS — I2510 Atherosclerotic heart disease of native coronary artery without angina pectoris: Secondary | ICD-10-CM

## 2024-01-23 MED ORDER — LOSARTAN POTASSIUM 50 MG PO TABS
50.0000 mg | ORAL_TABLET | Freq: Every day | ORAL | 3 refills | Status: AC
Start: 2024-01-23 — End: ?

## 2024-01-23 NOTE — Patient Instructions (Signed)
 Medication Instructions:  Your physician recommends the following medication changes.  INCREASE: Losartan  to 50 mg by mouth daily   *If you need a refill on your cardiac medications before your next appointment, please call your pharmacy*  Lab Work: No labs ordered today    Testing/Procedures: No test ordered today   Follow-Up: At [facility], you and your health needs are our priority.  As part of our continuing mission to provide you with exceptional heart care, our providers are all part of one team.  This team includes your primary Cardiologist (physician) and Advanced Practice Providers or APPs (Physician Assistants and Nurse Practitioners) who all work together to provide you with the care you need, when you need it.  Your next appointment:   4 month(s)  Provider:   You may see Deatrice Cage, MD or one of the following Advanced Practice Providers on your designated Care Team:   Lonni Meager, NP Lesley Maffucci, PA-C Bernardino Bring, PA-C Cadence Winchester, PA-C Tylene Lunch, NP Barnie Hila, NP

## 2024-01-23 NOTE — Progress Notes (Signed)
 Cardiology Office Note   Date:  01/23/2024   ID:  Larry Lane, DOB Aug 08, 1940, MRN [id number]  PCP:  Valora Lynwood FALCON, MD  Cardiologist:   Deatrice Cage, MD   Chief Complaint  Patient presents with   Follow-up    6 week follow up  / pt has been doing well with no complaints of chest pain, chest pressure or SOB, medication reviewed verbally with patient .       History of Present Illness: Larry Lane is a 83 y.o. male who presents for a follow-up visit regarding coronary artery disease and previous syncope.   He has known history of essential hypertension, hyperlipidemia, PVCs and diabetes mellitus. He was hospitalized at [facility] in November, 2024 with sepsis  and elevated troponin to the 3000 range.  Echo showed normal LV systolic function but dilated RV with suspicion for pulmonary embolism.  CTA was poor quality but there was enough suspicion for pulmonary embolism.  He was started on anticoagulation.  CTA was repeated and showed no evidence of pulmonary embolism but extensive coronary artery calcifications.  Anticoagulation was discontinued.  Repeat echocardiogram in December showed normal LV systolic function with only mildly dilated right ventricle with normal systolic function. He was rehospitalized in early December with presyncope.  Troponin was negative.  He had difficulty in ambulating since he had lumbar puncture in December with persistent left leg weakness.  He had orthostatic dizziness that improved after decreasing losartan . He underwent cardiac PET scan in January of 2025 which showed no evidence of ischemia.  There was evidence of a small infarct in the inferolateral area with normal ejection fraction.    He had carotid Doppler done in May 2025 which showed mild nonobstructive disease.  He was seen few weeks ago for worsening lower extremity edema.  The dose of furosemide  was increased to 40 mg once daily.  Amlodipine  was discontinued.  He reports  significant improvement since then.  No chest pain or worsening dyspnea.  No syncope or presyncope.  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   left occipital scalp, 5FU/cal vs EDC   Trigeminal nerve disorder    Trigeminal neuralgia     Past Surgical History:  Procedure Laterality Date   APPENDECTOMY  2010   CATARACT EXTRACTION W/PHACO Right 01/26/2017   Procedure: CATARACT EXTRACTION PHACO AND INTRAOCULAR LENS PLACEMENT (IOC);  Surgeon: Myrna Adine Anes, MD;  Location: [facility] ORS;  Service: Ophthalmology;  Laterality: Right;  Lot # [id number] H US : 00:48.2 AP%: 9.9 CDE: 5.35   I&D of back abscess  TRIGEMINAL NERVE DECOMPRESSION       Current Outpatient Medications  Medication Sig Dispense Refill   acetaminophen  (TYLENOL ) 325 MG tablet Take 1-2 tablets (325-650 mg total) by mouth every 4 (four) hours as needed for mild pain (pain score 1-3).     Apoaequorin 10 MG CAPS Take 1 capsule by mouth daily. Prevagen     aspirin  EC 81 MG tablet Take 1 tablet (81 mg total) by mouth daily. Swallow whole.     baclofen (LIORESAL) 10 MG tablet Take 10 mg by mouth 3 (three) times daily.      carbamazepine  (CARBATROL ) 200 MG 12 hr capsule Take 200 mg by mouth See admin instructions. Take 200mg  by mouth every 6 hours.     cholestyramine  light (PREVALITE ) 4 GM/DOSE powder Take 4 g by mouth 2 (two) times daily with a meal.     DM-Doxylamine-Acetaminophen  (NYQUIL COLD & FLU PO) Take 2 capsules by mouth at bedtime as needed (cold and flu symptoms).     DM-Phenylephrine-Acetaminophen  (VICKS DAYQUIL COLD & FLU PO) Take 2 capsules by mouth every 6 (six) hours as needed (cold and flu symptoms).     fluorouracil  (EFUDEX ) 5 % cream Apply topically 2 (two) times daily. 30 g 2   furosemide  (LASIX ) 40 MG tablet Take 1 tablet (40 mg total) by mouth daily. 90 tablet 3   gabapentin  (NEURONTIN ) 600 MG tablet Take 600 mg by mouth See admin instructions. Take 600mg  by mouth every 6 hours.     Insulin  Disposable Pump (V-GO 20) 20 UNIT/24HR KIT INJECT 20 EACH SUBCUTANEOUSLY ONCE DAILY. ADD ADDITIONAL CLICKS WITH MEALS, UP TO A TOTAL OF SIX CLICKS PER DAY     insulin  lispro (HUMALOG) 100 UNIT/ML cartridge 20-30 Units. Per pump.     Misc Natural Products (GLUCOSAMINE CHOND CMP ADVANCED) TABS Take by mouth.     Multiple Vitamin (MULTI-VITAMIN) tablet Take 1 tablet by mouth daily.     omeprazole (PRILOSEC) 20 MG capsule Take 20 mg by mouth daily.     rosuvastatin  (CRESTOR ) 10 MG tablet Take 1 tablet by mouth daily.     Turmeric (QC TUMERIC COMPLEX) 500 MG CAPS Take by mouth.     losartan  (COZAAR ) 50 MG tablet Take 1 tablet (50 mg total) by mouth daily. 90 tablet 3   No current facility-administered medications for this visit.    Allergies:   Patient has no known allergies.    Social History:  The patient  reports that he has never smoked. He has never used smokeless tobacco. He reports that he does not drink alcohol and does not use drugs.   Family History:  The patient's family history includes Heart disease in his father.    ROS:  Please see the history of present illness.   Otherwise, review of  systems are positive for none.   All other systems are reviewed and negative.    PHYSICAL EXAM: VS:  BP (!) 160/80 (BP Location: Left Arm, Patient Position: Sitting, Cuff Size: Normal)   Pulse 70   Ht 5' 4 (1.626 m)   Wt 194 lb 12.8 oz (88.4 kg)   SpO2 96%   BMI 33.44 kg/m  , BMI Body mass index is 33.44 kg/m. GEN: Well nourished, well developed, in no acute distress  HEENT: normal  Neck: no JVD or masses.  Right carotid bruit Cardiac: RRR; no rubs, or gallops, 2/6 systolic murmur in the aortic area.  Mild lower extremity edema. Respiratory:  clear to auscultation bilaterally, normal  work of breathing GI: soft, nontender, nondistended, + BS MS: no deformity or atrophy  Skin: warm and dry, no rash Neuro:  Strength and sensation are intact Psych: euthymic mood, full affect   EKG:  EKG is not ordered today.    Recent Labs: 01/29/2023: TSH 1.426 01/30/2023: Magnesium  1.9 02/27/2023: ALT 25; Hemoglobin 13.1; Platelets 331 01/15/2024: BUN 13; Creatinine, Ser 1.01; Potassium 4.8; Sodium 133    Lipid Panel No results found for: CHOL, TRIG, HDL, CHOLHDL, VLDL, LDLCALC, LDLDIRECT    Wt Readings from Last 3 Encounters:  01/23/24 194 lb 12.8 oz (88.4 kg)  01/05/24 195 lb 6.4 oz (88.6 kg)  06/27/23 191 lb 9.6 oz (86.9 kg)           No data to display            ASSESSMENT AND PLAN:  1.  Coronary artery disease involving native coronary arteries without angina: He had elevated troponin last year in the setting of underlying infection likely due to supply demand mismatch.  Cardiac PET scan showed no evidence of ischemia but he does have extensive coronary artery calcifications.  I recommend continuing medical therapy with low-dose aspirin  and treatment of risk factors.  2.  Syncope and presyncope due to suspected hypotension and orthostatic hypotension.   No recurrent episodes.  3.  Hyperlipidemia: Currently on rosuvastatin  10 mg daily.  Most recent lipid  profile showed an LDL of 65.  4.  Chronic diastolic heart failure: Significant lower extremity edema improved after increasing furosemide  and stopping amlodipine .  5.  Essential hypertension: Blood blood pressure increased since stopping amlodipine .  I elected to increase losartan  back to 50 mg daily.    Disposition:   FU with me in 4 months  Signed,  Deatrice Cage, MD  01/23/2024 4:40 PM    [facility]

## 2024-01-29 ENCOUNTER — Encounter: Payer: Self-pay | Admitting: Dermatology

## 2024-01-29 ENCOUNTER — Ambulatory Visit: Admitting: Dermatology

## 2024-01-29 DIAGNOSIS — Z85828 Personal history of other malignant neoplasm of skin: Secondary | ICD-10-CM | POA: Diagnosis not present

## 2024-01-29 DIAGNOSIS — T1490XD Injury, unspecified, subsequent encounter: Secondary | ICD-10-CM

## 2024-01-29 DIAGNOSIS — L905 Scar conditions and fibrosis of skin: Secondary | ICD-10-CM | POA: Diagnosis not present

## 2024-01-29 DIAGNOSIS — C4492 Squamous cell carcinoma of skin, unspecified: Secondary | ICD-10-CM

## 2024-01-29 NOTE — Patient Instructions (Addendum)

## 2024-01-29 NOTE — Progress Notes (Unsigned)
   Follow Up Visit   Subjective  Larry Lane is a 83 y.o. male who presents for the following: follow up from Mohs surgery   The patient presents for follow up from Mohs surgery for a SCC on the right postauricular neck, treated on 12/28/23, repaired with linear closure. The patient has been bandaging the wound as directed. The endorse the following concerns: none.   He is s/p Mohs for an SCC on his scalp, treated on 11/21/2023, healing by second intention.  The following portions of the chart were reviewed this encounter and updated as appropriate: medications, allergies, medical history  Review of Systems:  No other skin or systemic complaints except as noted in HPI or Assessment and Plan.  Objective  Well appearing patient in no apparent distress; mood and affect are within normal limits.  A focal examination was performed including scalp, head, face and right postauricular neck. All findings within normal limits unless otherwise noted below.  Healing wound with mild erythema     Relevant physical exam findings are noted in the Assessment and Plan.    Assessment & Plan   Healing Wound s/p Mohs for SCC on the right postauricular neck, treated on 12/28/23 repaired with linear closure. - Reassured that wound is healing well - No evidence of infection - No swelling, induration, purulence, dehiscence, or tenderness out of proportion to the clinical exam, see photo above - Discussed that scars take up to 12 months to mature from the date of surgery - Recommend SPF 30+ to scar daily to prevent purple color from UV exposure during scar maturation process - Discussed that erythema and raised appearance of scar will fade over the next 4-6 months - OK to start scar massage at 4-6 weeks post-op - Can consider silicone based products for scar healing starting at 6 weeks post-op  Scar s/p Mohs for [facility] on the scalp, treated on 11/21/2023, healing by second intention - Reassured that  wound has healed well - Discussed that scars take up to 12 months to mature from the date of surgery - Recommend SPF 30+ to scar daily to prevent purple color - OK to start scar massage at 4-6 weeks post-op - Can consider silicone based products for scar healing  HISTORY OF SQUAMOUS CELL CARCINOMA OF THE SKIN - No evidence of recurrence today - Recommend regular full body skin exams - Recommend daily broad spectrum sunscreen SPF 30+ to sun-exposed areas, reapply every 2 hours as needed.  - Call if any new or changing lesions are noted between office visits  Return if symptoms worsen or fail to improve.  I, Darice Smock, CMA, am acting as scribe for RUFUS CHRISTELLA HOLY, MD.   Documentation: I have reviewed the above documentation for accuracy and completeness, and I agree with the above.  RUFUS CHRISTELLA HOLY, MD

## 2024-02-12 ENCOUNTER — Ambulatory Visit (INDEPENDENT_AMBULATORY_CARE_PROVIDER_SITE_OTHER): Admitting: Dermatology

## 2024-02-12 ENCOUNTER — Encounter: Payer: Self-pay | Admitting: Dermatology

## 2024-02-12 VITALS — BP 160/94 | Temp 98.4°F

## 2024-02-12 DIAGNOSIS — C4492 Squamous cell carcinoma of skin, unspecified: Secondary | ICD-10-CM

## 2024-02-12 DIAGNOSIS — C4442 Squamous cell carcinoma of skin of scalp and neck: Secondary | ICD-10-CM

## 2024-02-12 DIAGNOSIS — L814 Other melanin hyperpigmentation: Secondary | ICD-10-CM

## 2024-02-12 DIAGNOSIS — L578 Other skin changes due to chronic exposure to nonionizing radiation: Secondary | ICD-10-CM | POA: Diagnosis not present

## 2024-02-12 NOTE — Progress Notes (Signed)
 Follow-Up Visit   Subjective  Larry Lane is a 83 y.o. 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 Claudene. He is accompanied by his wife.   Patient is to use efudex  to his scalp for previously biopsied SCCis. Patient's wife reports that she has not used it recently due to uncertainty in areas that are suppose to be treated.   The following portions of the chart were reviewed this encounter and updated as appropriate: medications, allergies, medical history  Review of Systems:  No other skin or systemic complaints except as noted in HPI or Assessment and Plan.  Objective  Well appearing patient in no apparent distress; mood and affect are within normal limits.  A focused examination was performed of the following areas: Mid frontal scalp anterior Relevant physical exam findings are noted in the Assessment and Plan.   Mid Frontal Scalp anterior Hyperkeratotic plaque   Assessment & Plan   Squamous Cell Carcinoma in Situ Exam: Scalp  Treatment Plan: Patient and wife advised that the scalp would benefit from using the cream throughout. Patient to wait 2 weeks before beginning treatment.   SQUAMOUS CELL CARCINOMA OF SKIN Mid Frontal Scalp anterior - Mohs surgery  Consent obtained: written  Anticoagulation: Is the patient taking prescription anticoagulant and/or aspirin  prescribed/recommended by a physician? Yes   Was the anticoagulation regimen changed prior to Mohs? No    Anesthesia: Anesthesia method: local infiltration Local anesthetic: lidocaine  1% WITH epi  Procedure Details: Timeout: pre-procedure verification complete Procedure Prep: patient was prepped and draped in usual sterile fashion Prep type: chlorhexidine  Biopsy accession number: [id number] Biopsy lab: [facility] Date of biopsy: 12/04/2023 Pre-Op diagnosis: squamous cell carcinoma SCC subtype:  well differentiated MohsAIQ Surgical site (if tumor spans multiple areas, please select predominant area): scalp Surgery side: midline Surgical site (from skin exam): Mid Frontal Scalp anterior Pre-operative length (cm): 1.6 Pre-operative width (cm): 0.8 Indications for Mohs surgery: anatomic location where tissue conservation is critical Previously treated? No    Micrographic Surgery Details: Post-operative length (cm): 2.4 Post-operative width (cm): 1.5 Number of Mohs stages: 2 Post surgery depth of defect: dermis and subcutaneous fat  Stage 1    Tumor features identified on Mohs section: squamous cell carcinoma  Stage 2    Tumor features identified on Mohs section: no tumor identified    Depth of tumor invasion after stage: dermis and subcutaneous fat  Patient tolerance of procedure: tolerated well, no immediate complications  Reconstruction: Was the defect reconstructed?: No    Antibiotics: Does patient meet AHA guidelines for endocarditis?: No   Does patient meet AHA guidelines for orthopedic prophylaxis?: No   Were antibiotics given on the day of surgery?: No   Did surgery breach mucosa, expose cartilage/bone, involve an area of lymphedema/inflamed/infected tissue? No      Return in about 4 weeks (around 03/11/2024) for wound check.  [name] Rollene Gobble, RN, am acting as scribe for RUFUS CHRISTELLA HOLY, MD .   02/12/2024  HISTORY OF PRESENT ILLNESS  Larry Lane is seen in consultation at the request of Dr. 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 They note that the area has been present for about 6 months increasing in size with time.  There is no history of previous treatment.  Reports no other new or changing lesions and has no other complaints today.  Medications and  allergies: see patient chart.  Review of systems: Reviewed 8 systems and notable for the above skin cancer.  All other systems reviewed  are unremarkable/negative, unless noted in the HPI. Past medical history, surgical history, family history, social history were also reviewed and are noted in the chart/questionnaire.    PHYSICAL EXAMINATION  General: Well-appearing, in no acute distress, alert and oriented x 4. Vitals reviewed in chart (if available).   Skin: Exam reveals a 1.6 x 0.8 cm erythematous papule and biopsy scar on the mid frontal scalp anterior. There are rhytids, telangiectasias, and lentigines, consistent with photodamage.  Biopsy report(s) reviewed, confirming the diagnosis.   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 Due to location, size, histology, or recurrence and the likelihood of subclinical extension as well as the need to conserve normal surrounding tissue, the patient was deemed acceptable for Mohs micrographic surgery (MMS).  The nature and purpose of the procedure, associated benefits and risks including recurrence and scarring, possible complications such as pain, infection, and bleeding, and alternative methods of treatment if appropriate were discussed with the patient during consent. The lesion location was verified by the patient, by reviewing previous notes, pathology reports, and by photographs as well as angulation measurements if available.  Informed consent was reviewed and signed by the patient, and timeout was performed at 8:30 AM. See op note below.  2. For the photodamage and solar lentigines, sun protection discussed/information given on OTC sunscreens, and we recommend continued regular follow-up with primary dermatologist every 6 months or sooner for any growing, bleeding, or changing lesions. 3. Prognosis and future surveillance discussed. 4. Letter with treatment outcome sent to referring provider. 5. Pain acetaminophen /ibuprofen  MOHS MICROGRAPHIC SURGERY AND  RECONSTRUCTION  Initial size:   1.6 x 0.8 cm Surgical defect/wound size: 2.4 x 1.5 cm Anesthesia:    0.33% lidocaine  with 1:200,000 epinephrine  EBL:    <5 mL Complications:  None Repair type:   Second Intention   Stages: 1  STAGE I: Anesthesia achieved with 0.5% lidocaine  with 1:200,000 epinephrine . ChloraPrep applied. 1 section(s) excised using Mohs technique (this includes total peripheral and deep tissue margin excision and evaluation with frozen sections, excised and interpreted by the same physician). The tumor was first debulked and then excised with an approx. 2 mm margin.  Hemostasis was achieved with electrocautery as needed.  The specimen was then oriented, subdivided/relaxed, inked, and processed using Mohs technique.    Frozen section analysis revealed a positive margin for atypical epithelial cells with squamous differentiation in the dermis in the peripheral margin.    STAGE II: An additional 2 mm margin was excised.  Hemostasis was achieved with electrocautery as needed.  The specimen was then oriented, subdivided/relaxed, inked, and processed using Mohs technique. Evaluation of slides by the Mohs surgeon revealed clear tumor margins.  Reconstruction  Patient was notified of results and repair options were discussed, including second intention healing. After reviewing the advantages and disadvantages of each, we agreed on second intention healing as appropriate.   The surgical site was then lightly scrubbed with sterile, saline-soaked gauze.  The area was bandaged using Vaseline ointment, non-adherent gauze, gauze pads, and tape to provide an adequate pressure dressing.   The patient tolerated the procedure well, was given detailed written and verbal wound care instructions, and was discharged in good condition.  The patient will follow-up in 4 weeks and as scheduled  with primary dermatologist.     Documentation: I have reviewed the above documentation for accuracy and  completeness, and I agree with the above.  RUFUS CHRISTELLA HOLY, MD

## 2024-02-12 NOTE — Patient Instructions (Signed)

## 2024-02-14 ENCOUNTER — Encounter: Payer: Self-pay | Admitting: Dermatology

## 2024-02-21 ENCOUNTER — Ambulatory Visit: Admitting: Cardiovascular Disease

## 2024-03-04 ENCOUNTER — Ambulatory Visit: Admitting: Dermatology

## 2024-03-04 ENCOUNTER — Encounter: Payer: Self-pay | Admitting: Dermatology

## 2024-03-04 DIAGNOSIS — L570 Actinic keratosis: Secondary | ICD-10-CM

## 2024-03-04 DIAGNOSIS — L578 Other skin changes due to chronic exposure to nonionizing radiation: Secondary | ICD-10-CM

## 2024-03-04 DIAGNOSIS — D485 Neoplasm of uncertain behavior of skin: Secondary | ICD-10-CM | POA: Diagnosis not present

## 2024-03-04 DIAGNOSIS — Z1283 Encounter for screening for malignant neoplasm of skin: Secondary | ICD-10-CM

## 2024-03-04 DIAGNOSIS — W908XXA Exposure to other nonionizing radiation, initial encounter: Secondary | ICD-10-CM | POA: Diagnosis not present

## 2024-03-04 DIAGNOSIS — Z86018 Personal history of other benign neoplasm: Secondary | ICD-10-CM | POA: Diagnosis not present

## 2024-03-04 DIAGNOSIS — L821 Other seborrheic keratosis: Secondary | ICD-10-CM

## 2024-03-04 DIAGNOSIS — L814 Other melanin hyperpigmentation: Secondary | ICD-10-CM | POA: Diagnosis not present

## 2024-03-04 DIAGNOSIS — D1801 Hemangioma of skin and subcutaneous tissue: Secondary | ICD-10-CM | POA: Diagnosis not present

## 2024-03-04 DIAGNOSIS — Z7189 Other specified counseling: Secondary | ICD-10-CM | POA: Diagnosis not present

## 2024-03-04 DIAGNOSIS — D229 Melanocytic nevi, unspecified: Secondary | ICD-10-CM

## 2024-03-04 DIAGNOSIS — D044 Carcinoma in situ of skin of scalp and neck: Secondary | ICD-10-CM | POA: Diagnosis not present

## 2024-03-04 NOTE — Progress Notes (Signed)
 "  Follow-Up Visit   Subjective  Larry Lane is a 84 y.o. male who presents for the following: Skin Cancer Screening and Full Body Skin Exam  The patient presents for Total-Body Skin Exam (TBSE) for skin cancer screening and mole check. The patient has spots, moles and lesions to be evaluated, some may be new or changing and the patient may have concern these could be cancer.  Hx SCC.   The following portions of the chart were reviewed this encounter and updated as appropriate: medications, allergies, medical history  Review of Systems:  No other skin or systemic complaints except as noted in HPI or Assessment and Plan.  Objective  Well appearing patient in no apparent distress; mood and affect are within normal limits.  A full examination was performed including scalp, head, eyes, ears, nose, lips, neck, chest, axillae, abdomen, back, buttocks, bilateral upper extremities, bilateral lower extremities, hands, feet, fingers, toes, fingernails, and toenails. All findings within normal limits unless otherwise noted below.   Relevant physical exam findings are noted in the Assessment and Plan.  posterior vertex scalp 12 mm hyperkeratotic plaque   Assessment & Plan   SKIN CANCER SCREENING PERFORMED TODAY.  ACTINIC DAMAGE WITH PRECANCEROUS ACTINIC KERATOSES Counseling for Topical Chemotherapy Management: Patient exhibits: - Severe, confluent actinic changes with pre-cancerous actinic keratoses that is secondary to cumulative UV radiation exposure over time - Condition that is severe; chronic, not at goal. - diffuse scaly erythematous macules and papules with underlying dyspigmentation - Discussed Prescription Field Treatment topical Chemotherapy for Severe, Chronic Confluent Actinic Changes with Pre-Cancerous Actinic Keratoses Field treatment involves treatment of an entire area of skin that has confluent Actinic Changes (Sun/ Ultraviolet light damage) and PreCancerous Actinic  Keratoses by method of PhotoDynamic Therapy (PDT) and/or prescription Topical Chemotherapy agents such as 5-fluorouracil , 5-fluorouracil /calcipotriene, and/or imiquimod.  The purpose is to decrease the number of clinically evident and subclinical PreCancerous lesions to prevent progression to development of skin cancer by chemically destroying early precancer changes that may or may not be visible.  It has been shown to reduce the risk of developing skin cancer in the treated area. As a result of treatment, redness, scaling, crusting, and open sores may occur during treatment course. One or more than one of these methods may be used and may have to be used several times to control, suppress and eliminate the PreCancerous changes. Discussed treatment course, expected reaction, and possible side effects. - Recommend daily broad spectrum sunscreen SPF 30+ to sun-exposed areas, reapply every 2 hours as needed.  - Staying in the shade or wearing long sleeves, sun glasses (UVA+UVB protection) and wide brim hats (4-inch brim around the entire circumference of the hat) are also recommended. - Call for new or changing lesions. - Start 5-fluorouracil /calcipotriene cream twice a day for 7 days or until red and irritated to affected areas including forearms. Patient provided with handout reviewing treatment course and side effects and advised to call or message us  on MyChart with any concerns.  Reviewed course of treatment and expected reaction.  Patient advised to expect inflammation and crusting and advised that erosions are possible.  Patient advised to be diligent with sun protection during and after treatment. Counseled to keep medication out of reach of children and pets.   LENTIGINES, SEBORRHEIC KERATOSES, HEMANGIOMAS - Benign normal skin lesions - Benign-appearing - Call for any changes  MELANOCYTIC NEVI - Tan-brown and/or pink-flesh-colored symmetric macules and papules - Benign appearing on exam  today - Observation -  Call clinic for new or changing moles - Recommend daily use of broad spectrum spf 30+ sunscreen to sun-exposed areas.   HISTORY OF SQUAMOUS CELL CARCINOMA OF THE SKIN - No evidence of recurrence today - No head, neck, clavicular lymphadenopathy - Recommend regular full body skin exams - Recommend daily broad spectrum sunscreen SPF 30+ to sun-exposed areas, reapply every 2 hours as needed.  - Call if any new or changing lesions are noted between office visits   HISTORY OF SQUAMOUS CELL CARCINOMA IN SITU OF THE SKIN - No evidence of recurrence today - Recommend regular full body skin exams - Recommend daily broad spectrum sunscreen SPF 30+ to sun-exposed areas, reapply every 2 hours as needed.  - Call if any new or changing lesions are noted between office visits  History of Dysplastic Nevi - No evidence of recurrence today - Recommend regular full body skin exams - Recommend daily broad spectrum sunscreen SPF 30+ to sun-exposed areas, reapply every 2 hours as needed.  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 scalp - Skin / nail biopsy Type of biopsy: tangential   Informed consent: discussed and consent obtained   Timeout: patient name, date of birth, surgical site, and procedure verified   Procedure prep:  Patient was prepped and draped in usual sterile fashion Prep type:  Isopropyl alcohol Anesthesia: the lesion was anesthetized in a standard fashion   Anesthetic:  1% lidocaine  w/ epinephrine  1-100,000 buffered w/ 8.4% NaHCO3 Instrument used: DermaBlade   Hemostasis achieved with: pressure and aluminum chloride   Outcome: patient tolerated procedure well   Post-procedure details: sterile dressing applied and wound care instructions given   Dressing type: bandage and petrolatum    Specimen 1 - Surgical pathology Differential Diagnosis: SCC  Check Margins: No AK  (ACTINIC KERATOSIS) (11) R chin x 1, R upper cutaneous lip x 1, R mid cheek x 1, R zygoma x 1, R inf helix x 1, R mid helix x 1, R antihelix x 1, L antihelix x 1, L postauricular x 1, L preauricular x 1, L sideburn x 1 (11) Actinic keratoses are precancerous spots that appear secondary to cumulative UV radiation exposure/sun exposure over time. They are chronic with expected duration over 1 year. A portion of actinic keratoses will progress to squamous cell carcinoma of the skin. It is not possible to reliably predict which spots will progress to skin cancer and so treatment is recommended to prevent development of skin cancer.  Recommend daily broad spectrum sunscreen SPF 30+ to sun-exposed areas, reapply every 2 hours as needed.  Recommend staying in the shade or wearing long sleeves, sun glasses (UVA+UVB protection) and wide brim hats (4-inch brim around the entire circumference of the hat). Call for new or changing lesions. - Destruction of lesion - R chin x 1, R upper cutaneous lip x 1, R mid cheek x 1, R zygoma x 1, R inf helix x 1, R mid helix x 1, R antihelix x 1, L antihelix x 1, L postauricular x 1, L preauricular x 1, L sideburn x 1 (11) Complexity: simple   Destruction method: cryotherapy   Informed consent: discussed and consent obtained   Timeout:  patient name, date of birth, surgical site, and procedure verified Lesion destroyed using liquid nitrogen: Yes   Region frozen until ice ball extended beyond lesion: Yes   Cryo cycles: 1 or 2. Outcome: patient tolerated procedure  well with no complications   Post-procedure details: wound care instructions given    MULTIPLE BENIGN NEVI   LENTIGINES   ACTINIC ELASTOSIS   SEBORRHEIC KERATOSES   CHERRY ANGIOMA   Return in about 6 months (around 09/01/2024) for TBSE, with Dr. Claudene, HxSCCis, HxAK, HxSCC.  [name] Lonell Drones, RMA, am acting as scribe for Boneta Claudene, MD .   Documentation: I have reviewed the above  documentation for accuracy and completeness, and I agree with the above.  Boneta Claudene, MD   "

## 2024-03-04 NOTE — Patient Instructions (Addendum)
 [facility] [address] [place], [place] [address]  Phone: [phone] TOLL-FREE: [phone]  - Start 5-fluorouracil /calcipotriene cream twice a day for 7 days or until red and irritated to affected areas including forearms. Patient provided with handout reviewing treatment course and side effects and advised to call or message us  on MyChart with any concerns.   Biopsy Wound Care Instructions  Leave the original bandage on for 24 hours if possible.  If the bandage becomes soaked or soiled before that time, it is OK to remove it and examine the wound.  A small amount of post-operative bleeding is normal.  If excessive bleeding occurs, remove the bandage, place gauze over the site and apply continuous pressure (no peeking) over the area for 30 minutes. If this does not work, please call our clinic as soon as possible or page your doctor if it is after hours.   Once a day, cleanse the wound with soap and water. It is fine to shower. If a thick crust develops you may use a Q-tip dipped into dilute hydrogen peroxide (mix 1:1 with water) to dissolve it.  Hydrogen peroxide can slow the healing process, so use it only as needed.    After washing, apply petroleum jelly (Vaseline) or an antibiotic ointment if your doctor prescribed one for you, followed by a bandage.    For best healing, the wound should be covered with a layer of ointment at all times. If you are not able to keep the area covered with a bandage to hold the ointment in place, this may mean re-applying the ointment several times a day.  Continue this wound care until the wound has healed and is no longer open.   Itching and mild discomfort is normal during the healing process. However, if you develop pain or severe itching, please call our office.   If you have any discomfort, you can take Tylenol  (acetaminophen ) or ibuprofen as directed on the bottle. (Please do not take these if you have an allergy to them or cannot take  them for another reason).  Some redness, tenderness and white or yellow material in the wound is normal healing.  If the area becomes very sore and red, or develops a thick yellow-green material (pus), it may be infected; please notify us .    If you have stitches, return to clinic as directed to have the stitches removed. You will continue wound care for 2-3 days after the stitches are removed.   Wound healing continues for up to one year following surgery. It is not unusual to experience pain in the scar from time to time during the interval.  If the pain becomes severe or the scar thickens, you should notify the office.    A slight amount of redness in a scar is expected for the first six months.  After six months, the redness will fade and the scar will soften and fade.  The color difference becomes less noticeable with time.  If there are any problems, return for a post-op surgery check at your earliest convenience.  To improve the appearance of the scar, you can use silicone scar gel, cream, or sheets (such as Mederma or Serica) every night for up to one year. These are available over the counter (without a prescription).  Please call our office at [phone] for any questions or concerns.  Melanoma ABCDEs  Melanoma is the most dangerous type of skin cancer, and is the leading cause of death from skin disease.  You are more  likely to develop melanoma if you: Have light-colored skin, light-colored eyes, or red or blond hair Spend a lot of time in the sun Tan regularly, either outdoors or in a tanning bed Have had blistering sunburns, especially during childhood Have a close family member who has had a melanoma Have atypical moles or large birthmarks  Early detection of melanoma is key since treatment is typically straightforward and cure rates are extremely high if we catch it early.   The first sign of melanoma is often a change in a mole or a new dark spot.  The ABCDE system is a  way of remembering the signs of melanoma.  A for asymmetry:  The two halves do not match. B for border:  The edges of the growth are irregular. C for color:  A mixture of colors are present instead of an even brown color. D for diameter:  Melanomas are usually (but not always) greater than 6mm - the size of a pencil eraser. E for evolution:  The spot keeps changing in size, shape, and color.  Please check your skin once per month between visits. You can use a small mirror in front and a large mirror behind you to keep an eye on the back side or your body.   If you see any new or changing lesions before your next follow-up, please call to schedule a visit.  Please continue daily skin protection including broad spectrum sunscreen SPF 30+ to sun-exposed areas, reapplying every 2 hours as needed when you're outdoors.    Due to recent changes in healthcare laws, you may see results of your pathology and/or laboratory studies on MyChart before the doctors have had a chance to review them. We understand that in some cases there may be results that are confusing or concerning to you. Please understand that not all results are received at the same time and often the doctors may need to interpret multiple results in order to provide you with the best plan of care or course of treatment. Therefore, we ask that you please give us  2 business days to thoroughly review all your results before contacting the office for clarification. Should we see a critical lab result, you will be contacted sooner.   If You Need Anything After Your Visit  If you have any questions or concerns for your doctor, please call our main line at [phone] and press option 4 to reach your doctor's medical assistant. If no one answers, please leave a voicemail as directed and we will return your call as soon as possible. Messages left after 4 pm will be answered the following business day.   You may also send us  a message via  MyChart. We typically respond to MyChart messages within 1-2 business days.  For prescription refills, please ask your pharmacy to contact our office. Our fax number is [phone].  If you have an urgent issue when the clinic is closed that cannot wait until the next business day, you can page your doctor at the number below.    Please note that while we do our best to be available for urgent issues outside of office hours, we are not available 24/7.   If you have an urgent issue and are unable to reach us , you may choose to seek medical care at your doctor's office, retail clinic, urgent care center, or emergency room.  If you have a medical emergency, please immediately call 911 or go to the emergency department.  Pager Numbers  -  Dr. Hester: [phone]  - Dr. Jackquline: [phone]  - Dr. Claudene: [phone]   - Dr. Raymund: [phone]  In the event of inclement weather, please call our main line at [phone] for an update on the status of any delays or closures.  Dermatology Medication Tips: Please keep the boxes that topical medications come in in order to help keep track of the instructions about where and how to use these. Pharmacies typically print the medication instructions only on the boxes and not directly on the medication tubes.   If your medication is too expensive, please contact our office at [phone] option 4 or send us  a message through MyChart.   We are unable to tell what your co-pay for medications will be in advance as this is different depending on your insurance coverage. However, we may be able to find a substitute medication at lower cost or fill out paperwork to get insurance to cover a needed medication.   If a prior authorization is required to get your medication covered by your insurance company, please allow us  1-2 business days to complete this process.  Drug prices often vary depending on where the prescription is filled and some  pharmacies may offer cheaper prices.  The website www.goodrx.com contains coupons for medications through different pharmacies. The prices here do not account for what the cost may be with help from insurance (it may be cheaper with your insurance), but the website can give you the price if you did not use any insurance.  - You can print the associated coupon and take it with your prescription to the pharmacy.  - You may also stop by our office during regular business hours and pick up a GoodRx coupon card.  - If you need your prescription sent electronically to a different pharmacy, notify our office through [facility] or by phone at [phone] option 4.     Si Usted Necesita Algo Despus de Su Visita  Tambin puede enviarnos un mensaje a travs de Clinical Cytogeneticist. Por lo general respondemos a los mensajes de MyChart en el transcurso de 1 a 2 das hbiles.  Para renovar recetas, por favor pida a su farmacia que se ponga en contacto con nuestra oficina. Randi lakes de fax es [place] [phone].  Si tiene un asunto urgente cuando la clnica est cerrada y que no puede esperar hasta el siguiente da hbil, puede llamar/localizar a su doctor(a) al nmero que aparece a continuacin.   Por favor, tenga en cuenta que aunque hacemos todo lo posible para estar disponibles para asuntos urgentes fuera del horario de [place], no estamos disponibles las 24 horas del da, los 7 [address]  [address] de la [place].   Si tiene un problema urgente y no puede comunicarse con nosotros, puede optar por buscar atencin mdica  en el consultorio de su doctor(a), en una clnica privada, en un centro de atencin urgente o en una sala de emergencias.  Si tiene engineer, drilling, por favor llame inmediatamente al 911 o vaya a la sala de emergencias.  Nmeros de bper  - Dr. Hester: [phone]  - Dra. Jackquline: [phone]  - Dr. Claudene: [phone]  - Dra. Kitts: [phone]  En caso de inclemencias del [place],  por favor llame a nuestra lnea principal al [phone] para una actualizacin sobre el estado de cualquier retraso o cierre.  Consejos para la medicacin en dermatologa: Por favor, guarde las cajas en las que vienen los medicamentos de uso tpico para ayudarle a seguir las instrucciones sobre dnde  y cmo usarlos. Las farmacias generalmente imprimen las instrucciones del medicamento slo en las cajas y no directamente en los tubos del [place].   Si su medicamento es muy caro, por favor, pngase en contacto con landry rieger llamando al [phone] y presione la opcin 4 o envenos un mensaje a travs de Clinical Cytogeneticist.   No podemos decirle cul ser su copago por los medicamentos por adelantado ya que esto es diferente dependiendo de la cobertura de su seguro. Sin embargo, es posible que podamos encontrar un medicamento sustituto a audiological scientist un formulario para que el seguro cubra el medicamento que se considera necesario.   Si se requiere una autorizacin previa para que su compaa de seguros cubra su medicamento, por favor permtanos de 1 a 2 das hbiles para completar este proceso.  Los precios de los medicamentos varan con frecuencia dependiendo del environmental consultant de dnde se surte la receta y alguna farmacias pueden ofrecer precios ms baratos.  El sitio web www.goodrx.com tiene cupones para medicamentos de health and safety inspector. Los precios aqu no tienen en cuenta lo que podra costar con la ayuda del seguro (puede ser ms barato con su seguro), pero el sitio web puede darle el precio si no utiliz tourist information centre manager.  - Puede imprimir el cupn correspondiente y llevarlo con su receta a la farmacia.  - Tambin puede pasar por nuestra oficina durante el horario de atencin regular y education officer, museum una tarjeta de cupones de GoodRx.  - Si necesita que su receta se enve electrnicamente a una farmacia diferente, informe a nuestra oficina a travs de MyChart de [place] o por telfono llamando al  [phone] y presione la opcin 4.

## 2024-03-05 ENCOUNTER — Ambulatory Visit: Payer: Self-pay | Admitting: Dermatology

## 2024-03-05 LAB — SURGICAL PATHOLOGY

## 2024-05-22 ENCOUNTER — Ambulatory Visit: Admitting: Cardiovascular Disease

## 2024-09-09 ENCOUNTER — Ambulatory Visit: Admitting: Dermatology
